# Patient Record
Sex: Female | Born: 1965 | Race: Black or African American | Hispanic: No | Smoking: Former smoker
Health system: Southern US, Community
[De-identification: ages and names within clinical notes are randomized; demographics above are authoritative.]

## PROBLEM LIST (undated history)

## (undated) ENCOUNTER — Ambulatory Visit: Disposition: A | Discharge status: Other Acute Inpatient Hospital | Payer: 59

## (undated) DIAGNOSIS — I10 Essential (primary) hypertension: Secondary | ICD-10-CM

## (undated) DIAGNOSIS — E0590 Thyrotoxicosis, unspecified without thyrotoxic crisis or storm: Secondary | ICD-10-CM

## (undated) DIAGNOSIS — D219 Benign neoplasm of connective and other soft tissue, unspecified: Secondary | ICD-10-CM

## (undated) DIAGNOSIS — D649 Anemia, unspecified: Secondary | ICD-10-CM

## (undated) DIAGNOSIS — M1990 Unspecified osteoarthritis, unspecified site: Secondary | ICD-10-CM

## (undated) DIAGNOSIS — Z789 Other specified health status: Secondary | ICD-10-CM

## (undated) DIAGNOSIS — K8020 Calculus of gallbladder without cholecystitis without obstruction: Secondary | ICD-10-CM

## (undated) DIAGNOSIS — E669 Obesity, unspecified: Secondary | ICD-10-CM

## (undated) DIAGNOSIS — F1910 Other psychoactive substance abuse, uncomplicated: Secondary | ICD-10-CM

## (undated) DIAGNOSIS — E119 Type 2 diabetes mellitus without complications: Secondary | ICD-10-CM

## (undated) DIAGNOSIS — E079 Disorder of thyroid, unspecified: Secondary | ICD-10-CM

## (undated) HISTORY — DX: Calculus of gallbladder without cholecystitis without obstruction: K80.20

## (undated) HISTORY — DX: Benign neoplasm of connective and other soft tissue, unspecified: D21.9

## (undated) HISTORY — DX: Other psychoactive substance abuse, uncomplicated: F19.10

## (undated) HISTORY — DX: Anemia, unspecified: D64.9

## (undated) HISTORY — DX: Unspecified osteoarthritis, unspecified site: M19.90

## (undated) HISTORY — PX: OTHER SURGICAL HISTORY: SHX169

## (undated) HISTORY — DX: Type 2 diabetes mellitus without complications: E11.9

## (undated) HISTORY — PX: TUBAL LIGATION: SHX77

---

## 1994-03-04 HISTORY — PX: OTHER SURGICAL HISTORY: SHX169

## 1998-01-24 ENCOUNTER — Other Ambulatory Visit: Admission: RE | Admit: 1998-01-24 | Discharge: 1998-01-24 | Payer: Self-pay | Admitting: Obstetrics

## 1998-03-08 ENCOUNTER — Ambulatory Visit (HOSPITAL_COMMUNITY): Admission: RE | Admit: 1998-03-08 | Discharge: 1998-03-08 | Payer: Self-pay | Admitting: Obstetrics

## 1998-03-14 ENCOUNTER — Inpatient Hospital Stay (HOSPITAL_COMMUNITY): Admission: AD | Admit: 1998-03-14 | Discharge: 1998-03-14 | Payer: Self-pay | Admitting: Obstetrics

## 1998-03-16 ENCOUNTER — Other Ambulatory Visit: Admission: RE | Admit: 1998-03-16 | Discharge: 1998-03-16 | Payer: Self-pay | Admitting: Obstetrics

## 2000-07-09 ENCOUNTER — Emergency Department (HOSPITAL_COMMUNITY): Admission: EM | Admit: 2000-07-09 | Discharge: 2000-07-09 | Payer: Self-pay | Admitting: *Deleted

## 2000-09-30 ENCOUNTER — Emergency Department (HOSPITAL_COMMUNITY): Admission: EM | Admit: 2000-09-30 | Discharge: 2000-09-30 | Payer: Self-pay | Admitting: Internal Medicine

## 2000-09-30 ENCOUNTER — Encounter: Payer: Self-pay | Admitting: Internal Medicine

## 2005-12-11 ENCOUNTER — Emergency Department (HOSPITAL_COMMUNITY): Admission: EM | Admit: 2005-12-11 | Discharge: 2005-12-11 | Payer: Self-pay | Admitting: Emergency Medicine

## 2006-06-06 ENCOUNTER — Emergency Department (HOSPITAL_COMMUNITY): Admission: EM | Admit: 2006-06-06 | Discharge: 2006-06-06 | Payer: Self-pay | Admitting: Ophthalmology

## 2006-12-10 ENCOUNTER — Encounter: Admission: RE | Admit: 2006-12-10 | Discharge: 2006-12-10 | Payer: Self-pay | Admitting: Emergency Medicine

## 2008-12-17 ENCOUNTER — Emergency Department (HOSPITAL_COMMUNITY): Admission: EM | Admit: 2008-12-17 | Discharge: 2008-12-17 | Payer: Self-pay | Admitting: Emergency Medicine

## 2009-07-02 ENCOUNTER — Emergency Department (HOSPITAL_COMMUNITY): Admission: EM | Admit: 2009-07-02 | Discharge: 2009-07-02 | Payer: Self-pay | Admitting: Emergency Medicine

## 2009-08-24 ENCOUNTER — Emergency Department (HOSPITAL_COMMUNITY): Admission: EM | Admit: 2009-08-24 | Discharge: 2009-08-24 | Payer: Self-pay | Admitting: Emergency Medicine

## 2010-01-13 ENCOUNTER — Emergency Department (HOSPITAL_COMMUNITY): Admission: EM | Admit: 2010-01-13 | Discharge: 2010-01-13 | Payer: Self-pay | Admitting: Emergency Medicine

## 2010-03-25 ENCOUNTER — Encounter: Payer: Self-pay | Admitting: Emergency Medicine

## 2010-04-16 ENCOUNTER — Emergency Department (HOSPITAL_COMMUNITY)
Admission: EM | Admit: 2010-04-16 | Discharge: 2010-04-16 | Disposition: A | Discharge status: Home / Self Care | Payer: Self-pay | Attending: Emergency Medicine | Admitting: Emergency Medicine

## 2010-04-16 DIAGNOSIS — N898 Other specified noninflammatory disorders of vagina: Secondary | ICD-10-CM | POA: Insufficient documentation

## 2010-04-16 LAB — CBC
HCT: 32.3 % — ABNORMAL LOW (ref 36.0–46.0)
Hemoglobin: 10.8 g/dL — ABNORMAL LOW (ref 12.0–15.0)
MCH: 27.8 pg (ref 26.0–34.0)
MCHC: 33.4 g/dL (ref 30.0–36.0)
MCV: 83.2 fL (ref 78.0–100.0)
Platelets: 269 10*3/uL (ref 150–400)
RBC: 3.88 MIL/uL (ref 3.87–5.11)
RDW: 13.3 % (ref 11.5–15.5)
WBC: 9.3 10*3/uL (ref 4.0–10.5)

## 2010-04-16 LAB — DIFFERENTIAL
Basophils Absolute: 0.1 10*3/uL (ref 0.0–0.1)
Basophils Relative: 1 % (ref 0–1)
Eosinophils Absolute: 0.1 10*3/uL (ref 0.0–0.7)
Eosinophils Relative: 1 % (ref 0–5)
Lymphocytes Relative: 42 % (ref 12–46)
Lymphs Abs: 3.9 10*3/uL (ref 0.7–4.0)
Monocytes Absolute: 0.5 10*3/uL (ref 0.1–1.0)
Monocytes Relative: 6 % (ref 3–12)
Neutro Abs: 4.8 10*3/uL (ref 1.7–7.7)
Neutrophils Relative %: 51 % (ref 43–77)

## 2010-05-19 ENCOUNTER — Emergency Department (HOSPITAL_COMMUNITY)
Admission: EM | Admit: 2010-05-19 | Discharge: 2010-05-19 | Disposition: A | Discharge status: Home / Self Care | Payer: Self-pay | Attending: Emergency Medicine | Admitting: Emergency Medicine

## 2010-05-19 DIAGNOSIS — D649 Anemia, unspecified: Secondary | ICD-10-CM | POA: Insufficient documentation

## 2010-05-19 DIAGNOSIS — A499 Bacterial infection, unspecified: Secondary | ICD-10-CM | POA: Insufficient documentation

## 2010-05-19 DIAGNOSIS — N898 Other specified noninflammatory disorders of vagina: Secondary | ICD-10-CM | POA: Insufficient documentation

## 2010-05-19 DIAGNOSIS — B9689 Other specified bacterial agents as the cause of diseases classified elsewhere: Secondary | ICD-10-CM | POA: Insufficient documentation

## 2010-05-19 DIAGNOSIS — N760 Acute vaginitis: Secondary | ICD-10-CM | POA: Insufficient documentation

## 2010-05-19 LAB — URINALYSIS, ROUTINE W REFLEX MICROSCOPIC
Glucose, UA: NEGATIVE mg/dL
Ketones, ur: NEGATIVE mg/dL
Leukocytes, UA: NEGATIVE
pH: 7.5 (ref 5.0–8.0)

## 2010-05-19 LAB — DIFFERENTIAL
Basophils Absolute: 0.1 10*3/uL (ref 0.0–0.1)
Lymphocytes Relative: 38 % (ref 12–46)
Monocytes Absolute: 0.5 10*3/uL (ref 0.1–1.0)
Neutro Abs: 4.6 10*3/uL (ref 1.7–7.7)

## 2010-05-19 LAB — CBC
HCT: 33.7 % — ABNORMAL LOW (ref 36.0–46.0)
Hemoglobin: 10.6 g/dL — ABNORMAL LOW (ref 12.0–15.0)
WBC: 8.4 10*3/uL (ref 4.0–10.5)

## 2010-05-19 LAB — WET PREP, GENITAL: Yeast Wet Prep HPF POC: NONE SEEN

## 2010-05-19 LAB — POCT PREGNANCY, URINE: Preg Test, Ur: NEGATIVE

## 2010-05-20 LAB — WOUND CULTURE

## 2010-05-23 LAB — GC/CHLAMYDIA PROBE AMP, GENITAL: GC Probe Amp, Genital: NEGATIVE

## 2010-06-07 LAB — GC/CHLAMYDIA PROBE AMP, GENITAL
Chlamydia, DNA Probe: NEGATIVE
GC Probe Amp, Genital: NEGATIVE

## 2010-06-07 LAB — WET PREP, GENITAL
Trich, Wet Prep: NONE SEEN
Yeast Wet Prep HPF POC: NONE SEEN

## 2010-11-05 ENCOUNTER — Emergency Department (HOSPITAL_COMMUNITY)
Admission: EM | Admit: 2010-11-05 | Discharge: 2010-11-05 | Disposition: A | Discharge status: Home / Self Care | Payer: No Typology Code available for payment source | Attending: Emergency Medicine | Admitting: Emergency Medicine

## 2010-11-05 DIAGNOSIS — S139XXA Sprain of joints and ligaments of unspecified parts of neck, initial encounter: Secondary | ICD-10-CM | POA: Insufficient documentation

## 2010-11-05 DIAGNOSIS — Y92410 Unspecified street and highway as the place of occurrence of the external cause: Secondary | ICD-10-CM | POA: Insufficient documentation

## 2010-11-06 ENCOUNTER — Encounter: Payer: Self-pay | Admitting: Family Medicine

## 2010-11-06 ENCOUNTER — Ambulatory Visit (INDEPENDENT_AMBULATORY_CARE_PROVIDER_SITE_OTHER): Payer: No Typology Code available for payment source | Admitting: Family Medicine

## 2010-11-06 DIAGNOSIS — Z0000 Encounter for general adult medical examination without abnormal findings: Secondary | ICD-10-CM

## 2010-11-06 DIAGNOSIS — N920 Excessive and frequent menstruation with regular cycle: Secondary | ICD-10-CM | POA: Insufficient documentation
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
## 2010-11-06 NOTE — Assessment & Plan Note (Signed)
Patient with menorrhagia for the past several months. Not currently. Asymptomatic of symptoms of anemia currently as well. Will check CBC at next visit (which will be a full physical). Consider starting OCPs if she is interested to help with bleeding. Patient is concerned about fibroids since her sister recently had a fibroidectomy for DUB.

## 2010-11-06 NOTE — Patient Instructions (Signed)
After you get the [facility] card, schedule a follow-up with me to get your physical done. We will do a pap smear at that time and check your labs.  Call and schedule a mammogram.    High-Fiber Diet A high-fiber diet changes your normal diet to include more whole grains, legumes, fruits, and vegetables. Changes in the diet involve replacing refined carbohydrates with unrefined foods. The calorie level of the diet is essentially unchanged. The Dietary Reference Intake (recommended amount) for adult males is 38 grams per day. For adult females, it is 25 grams per day. Pregnant and lactating women should consume 28 grams of fiber per day. Fiber is the intact part of a plant that is not broken down during digestion. Functional fiber is fiber that has been isolated from the plant to provide a beneficial effect in the body. PURPOSE  Increase stool bulk.   Ease and regulate bowel movements.   Lower cholesterol.  INDICATIONS THAT YOU NEED MORE FIBER  Constipation and hemorrhoids.   Uncomplicated diverticulosis (intestine condition) and irritable bowel syndrome.   Weight management.   As a protective measure against hardening of the arteries (atherosclerosis), diabetes, and cancer.  NOTE OF CAUTION If you have a digestive or bowel problem, ask your caregiver for advice before adding high-fiber foods to your diet. Some of the following medical problems are such that a high-fiber diet should not be used without consulting your caregiver. DO NOT USE WITH:  Acute diverticulitis (intestine infection).   Partial small bowel obstructions.   Complicated diverticular disease involving bleeding, rupture (perforation), or abscess (boil, furuncle).   Presence of autonomic neuropathy (nerve damage) or gastric paresis (stomach cannot empty itself).  GUIDELINES FOR INCREASING FIBER IN THE DIET  Start adding fiber to the diet slowly. A gradual increase of about 5 more grams (2 slices of whole-wheat bread, 2  servings of most fruits or vegetables, or 1 bowl of high-fiber cereal) per day is best. Too rapid an increase in fiber may result in constipation, flatulence, and bloating.   Drink enough water and fluids to keep your urine clear or pale yellow. Water, juice, or caffeine-free drinks are recommended. Not drinking enough fluid may cause constipation.   Eat a variety of high-fiber foods rather than one type of fiber.   Try to increase your intake of fiber through using high-fiber foods rather than fiber pills or supplements that contain small amounts of fiber.   The goal is to change the types of food eaten. Do not supplement your present diet with high-fiber foods, but replace foods in your present diet.  INCLUDE A VARIETY OF FIBER SOURCES  Replace refined and processed grains with whole grains, canned fruits with fresh fruits, and incorporate other fiber sources. White rice, white breads, and most bakery goods contain little or no fiber.   Brown whole-grain rice, buckwheat oats, and many fruits and vegetables are all good sources of fiber. These include: broccoli, Brussels sprouts, cabbage, cauliflower, beets, sweet potatoes, white potatoes (skin on), carrots, tomatoes, eggplant, squash, berries, fresh fruits, and dried fruits.   Cereals appear to be the richest source of fiber. Cereal fiber is found in whole grains and bran. Bran is the fiber-rich outer coat of cereal grain, which is largely removed in refining. In whole-grain cereals, the bran remains. In breakfast cereals, the largest amount of fiber is found in those with "bran" in their names. The fiber content is sometimes indicated on the label.   You may need to include additional  fruits and vegetables each day.   In baking, for 1 cup white flour, you may use the following substitutions:   1 cup whole-wheat flour minus 2 tablespoons.   1/2 cup white flour plus 1/2 cup whole-wheat flour.  References: Dietary Reference Intakes:  Recommended Intakes for Individuals. [employer]. Institute of Medicine. Food and Nutrition Board. Document Released: 02/18/2005 Document Re-Released: 05/15/2009 [employer] [facility] Patient Information 2011 [place], [place].

## 2010-11-06 NOTE — Assessment & Plan Note (Addendum)
Initial visit with me. Requesting physical and asked to re-schedule this at subsequent visit. Consider checking CMET and FLP at next visit and performing Pap smear, which she is overdue for, with GC/Chlamydia and HIV/RPR per her request to be tested for STIs. Also asking about being checked for hepatitis C due to her ex-husband having this condition. Will check HgbA1c if Glc on CMET is elevated. Also given information to schedule mammogram, which she has not had for several years.

## 2010-11-11 ENCOUNTER — Emergency Department (HOSPITAL_COMMUNITY): Payer: No Typology Code available for payment source

## 2010-11-11 ENCOUNTER — Emergency Department (HOSPITAL_COMMUNITY)
Admission: EM | Admit: 2010-11-11 | Discharge: 2010-11-11 | Disposition: A | Discharge status: Home / Self Care | Payer: No Typology Code available for payment source | Attending: Emergency Medicine | Admitting: Emergency Medicine

## 2010-11-11 DIAGNOSIS — M542 Cervicalgia: Secondary | ICD-10-CM | POA: Insufficient documentation

## 2010-11-21 ENCOUNTER — Other Ambulatory Visit: Payer: Self-pay | Admitting: Family Medicine

## 2010-11-21 ENCOUNTER — Emergency Department (HOSPITAL_COMMUNITY): Payer: No Typology Code available for payment source

## 2010-11-21 ENCOUNTER — Emergency Department (HOSPITAL_COMMUNITY)
Admission: EM | Admit: 2010-11-21 | Discharge: 2010-11-21 | Disposition: A | Discharge status: Home / Self Care | Payer: No Typology Code available for payment source | Attending: Emergency Medicine | Admitting: Emergency Medicine

## 2010-11-21 DIAGNOSIS — Z1231 Encounter for screening mammogram for malignant neoplasm of breast: Secondary | ICD-10-CM

## 2010-11-21 DIAGNOSIS — S139XXA Sprain of joints and ligaments of unspecified parts of neck, initial encounter: Secondary | ICD-10-CM | POA: Insufficient documentation

## 2010-11-21 DIAGNOSIS — Y92410 Unspecified street and highway as the place of occurrence of the external cause: Secondary | ICD-10-CM | POA: Insufficient documentation

## 2010-11-21 DIAGNOSIS — M542 Cervicalgia: Secondary | ICD-10-CM | POA: Insufficient documentation

## 2010-11-21 DIAGNOSIS — R079 Chest pain, unspecified: Secondary | ICD-10-CM | POA: Insufficient documentation

## 2010-11-27 ENCOUNTER — Other Ambulatory Visit (HOSPITAL_COMMUNITY)
Admission: RE | Admit: 2010-11-27 | Discharge: 2010-11-27 | Disposition: A | Discharge status: Home / Self Care | Payer: No Typology Code available for payment source | Source: Ambulatory Visit | Attending: Family Medicine | Admitting: Family Medicine

## 2010-11-27 ENCOUNTER — Ambulatory Visit (INDEPENDENT_AMBULATORY_CARE_PROVIDER_SITE_OTHER): Payer: No Typology Code available for payment source | Admitting: Family Medicine

## 2010-11-27 ENCOUNTER — Encounter: Payer: Self-pay | Admitting: Family Medicine

## 2010-11-27 DIAGNOSIS — S161XXA Strain of muscle, fascia and tendon at neck level, initial encounter: Secondary | ICD-10-CM | POA: Insufficient documentation

## 2010-11-27 DIAGNOSIS — Z0000 Encounter for general adult medical examination without abnormal findings: Secondary | ICD-10-CM

## 2010-11-27 DIAGNOSIS — N760 Acute vaginitis: Secondary | ICD-10-CM

## 2010-11-27 DIAGNOSIS — N920 Excessive and frequent menstruation with regular cycle: Secondary | ICD-10-CM

## 2010-11-27 DIAGNOSIS — Z124 Encounter for screening for malignant neoplasm of cervix: Secondary | ICD-10-CM

## 2010-11-27 DIAGNOSIS — R600 Localized edema: Secondary | ICD-10-CM | POA: Insufficient documentation

## 2010-11-27 DIAGNOSIS — R609 Edema, unspecified: Secondary | ICD-10-CM

## 2010-11-27 DIAGNOSIS — Z01419 Encounter for gynecological examination (general) (routine) without abnormal findings: Secondary | ICD-10-CM | POA: Insufficient documentation

## 2010-11-27 DIAGNOSIS — S139XXA Sprain of joints and ligaments of unspecified parts of neck, initial encounter: Secondary | ICD-10-CM

## 2010-11-27 LAB — POCT WET PREP (WET MOUNT): Yeast Wet Prep HPF POC: NEGATIVE

## 2010-11-27 LAB — CBC
HCT: 34.1 % — ABNORMAL LOW (ref 36.0–46.0)
Hemoglobin: 10.5 g/dL — ABNORMAL LOW (ref 12.0–15.0)
WBC: 7.5 10*3/uL (ref 4.0–10.5)

## 2010-11-27 LAB — COMPREHENSIVE METABOLIC PANEL
CO2: 25 mEq/L (ref 19–32)
Calcium: 10 mg/dL (ref 8.4–10.5)
Creat: 0.91 mg/dL (ref 0.50–1.10)
Glucose, Bld: 89 mg/dL (ref 70–99)
Sodium: 138 mEq/L (ref 135–145)
Total Bilirubin: 0.5 mg/dL (ref 0.3–1.2)
Total Protein: 7 g/dL (ref 6.0–8.3)

## 2010-11-27 LAB — LDL CHOLESTEROL, DIRECT: Direct LDL: 105 mg/dL — ABNORMAL HIGH

## 2010-11-27 MED ORDER — MEDICAL COMPRESSION SOCKS MISC: 1.0000 [IU] | Freq: Every day | Status: DC

## 2010-11-27 MED ORDER — NORGESTIMATE-ETH ESTRADIOL 0.25-35 MG-MCG PO TABS: 1.0000 | ORAL_TABLET | Freq: Every day | ORAL | Status: DC

## 2010-11-27 NOTE — Assessment & Plan Note (Signed)
Seems to be due to venous insufficiency. Will check labs (CBC, CMET) to evaluate kidneys and liver. No history of cardiac problems in past medical, family, or complaints. Recommending low salt diet and compression stockings.

## 2010-11-27 NOTE — Assessment & Plan Note (Signed)
Persistent. Seen in ED twice this month. Stop naproxen which makes her sleepy and try ibuprofen. Will refer to PT.

## 2010-11-27 NOTE — Assessment & Plan Note (Addendum)
Checking pap today. Flu shot today. Mammogram around January 2013 (patient arranging this).  Update: Wet prep shows BV. Called and left message with patient that Rx for metronidazole sent to pharmacy.
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
## 2010-11-27 NOTE — Assessment & Plan Note (Addendum)
Unchanged. Trial of OCPs. Will get ultrasound vagina/pelvis to investigate. Patient concerned about fibroids.   Update: ultrasound shows several large fibroids. Patient would like them removed. Will refer to gynecologist.

## 2010-11-27 NOTE — Patient Instructions (Addendum)
We'll check labs today. If your lab results are normal, I will send you a letter with the results. If abnormal, someone at the clinic will get in touch with you.   Follow-up in 3 months regarding your bleeding. Try the birth control pills.  We will order an ultrasound to look for fibroids.  We will refer you to physical therapy for your neck.  Try the ibuprofen for pain.  Try the compression stockings for your leg swelling.  Eat a low salt diet.  2 Gram Low Sodium Diet A 2 gram sodium diet restricts the amount of sodium in the diet to no more than 2 grams (g) or 2000 milligrams (mg) daily. Limiting the amount of sodium is often used to help lower blood pressure. It is important if you have heart, liver, or kidney problems. Many foods contain sodium for flavor and sometimes as a preservative. When the amount of sodium in a diet needs to be low, it is important to know what to look for when choosing foods and drinks. The following includes some information and guidelines to help make it easier for you to adapt to a low sodium diet. QUICK TIPS  Do not add salt to food.   Avoid convenience items and fast food.   Choose unsalted snack foods.   Buy lower sodium products, often labeled as "lower sodium" or "no salt added."   Check food labels to learn how much sodium is in 1 serving.   When eating at a restaurant, ask that your food be prepared with less salt or none, if possible.  READING FOOD LABELS FOR SODIUM INFORMATION The nutrition facts label is a good place to find how much sodium is in foods. Look for products with no more than 500 to 600 mg of sodium per meal and no more than 150 mg per serving. Remember that 2 g = 2000 mg. The food label may also list foods as:  Sodium-free: Less than 5 mg in a serving.   Very low sodium: 35 mg or less in a serving.   Low-sodium: 140 mg or less in a serving.   Light in sodium: 50% less sodium in a serving. For example, if a food that  usually has 300 mg of sodium is changed to become light in sodium, it will have 150 mg of sodium.   Reduced sodium: 25% less sodium in a serving. For example, if a food that usually has 400 mg of sodium is changed to reduced sodium, it will have 300 mg of sodium.  CHOOSING FOODS AVOID  CHOOSE   Grains:  Salted crackers and snack items. Some cereals, including instant hot cereals. Bread stuffing and biscuit mixes. Seasoned rice or pasta mixes.  Grains:  Unsalted snack items. Low-sodium cereals, oats, puffed wheat and rice, shredded wheat. English muffins and bread. Pasta.   Meats:  Salted, canned, smoked, spiced, or pickled meats, including fish and poultry. Bacon, ham, sausage, cold cuts, hot dogs, anchovies.  Meats:  Low-sodium canned tuna and salmon. Fresh or frozen meat, poultry, and fish.   Dairy:  Processed cheese and spreads. Cottage cheese. Buttermilk and condensed milk. Regular cheese.  Dairy:  Milk. Low-sodium cottage cheese. Yogurt. Sour cream. Low-sodium cheese.   Fruits and Vegetables:  Regular canned vegetables. Regular canned tomato sauce and paste. Frozen vegetables in sauces. Olives. Rosita Fire. Relishes. Sauerkraut.  Fruits and Vegetables:  Low-sodium canned vegetables. Low-sodium tomato sauce and paste. Fresh and frozen vegetables. Fresh and frozen fruit.   Condiments:  Canned and packaged gravies. Worcestershire sauce. Tartar sauce. Barbecue sauce. Soy sauce. Steak sauce. Ketchup. Onion, garlic, and table salt. Meat flavorings and tenderizers.  Condiments:  Fresh and dried herbs and spices. Low-sodium varieties of mustard and ketchup. Lemon juice. Tabasco sauce. Horseradish.   SAMPLE 2 GRAM SODIUM MEAL PLAN Meal  Foods  Sodium (mg)   Breakfast  1 cup low-fat milk  2 slices whole-wheat toast 1 tablespoon heart-healthy margarine 1 hard-boiled egg 1 small orange  143  [phone]   Lunch  1 cup raw carrots   cup hummus 1 cup low-fat  milk  cup red grapes 1 whole-wheat pita bread  76  [phone]   Dinner  1 cup whole-wheat pasta  1 cup low-sodium tomato sauce 3 ounces lean ground beef 1 small side salad (1 cup raw spinach leaves,  cup cucumber,  cup yellow bell pepper) with 1 teaspoon olive oil and 1 teaspoon red wine vinegar  2  73 [phone] 25   Snack  1 container low-fat vanilla yogurt  3 graham cracker squares  107  127   Nutrient Analysis Calories: 2033 Protein: 77 g Carbohydrate: 282 g Fat: 72 g Sodium: 1971 mg Document Released: 02/18/2005 Document Re-Released: 08/08/2009 [facility] Patient Information 2011 [place], [place].

## 2010-11-28 MED ORDER — METRONIDAZOLE 250 MG PO TABS: 250.0000 mg | ORAL_TABLET | Freq: Two times a day (BID) | ORAL | Status: AC

## 2010-11-28 NOTE — Progress Notes (Signed)
Addended by: Madolyn Frieze, Marylene Land J on: 11/28/2010 01:54 PM   Modules accepted: Orders

## 2010-11-29 ENCOUNTER — Encounter: Payer: Self-pay | Admitting: Family Medicine

## 2010-12-07 ENCOUNTER — Ambulatory Visit (HOSPITAL_COMMUNITY)
Admission: RE | Admit: 2010-12-07 | Discharge: 2010-12-07 | Disposition: A | Discharge status: Home / Self Care | Payer: Self-pay | Source: Ambulatory Visit | Attending: Family Medicine | Admitting: Family Medicine

## 2010-12-07 DIAGNOSIS — N949 Unspecified condition associated with female genital organs and menstrual cycle: Secondary | ICD-10-CM | POA: Insufficient documentation

## 2010-12-07 DIAGNOSIS — N920 Excessive and frequent menstruation with regular cycle: Secondary | ICD-10-CM

## 2010-12-07 DIAGNOSIS — N938 Other specified abnormal uterine and vaginal bleeding: Secondary | ICD-10-CM | POA: Insufficient documentation

## 2010-12-07 DIAGNOSIS — D252 Subserosal leiomyoma of uterus: Secondary | ICD-10-CM | POA: Insufficient documentation

## 2010-12-07 DIAGNOSIS — N946 Dysmenorrhea, unspecified: Secondary | ICD-10-CM | POA: Insufficient documentation

## 2010-12-08 NOTE — Progress Notes (Signed)
Addended by: Madolyn Frieze, Marylene Land J on: 12/08/2010 01:00 PM   Modules accepted: Orders

## 2010-12-12 ENCOUNTER — Ambulatory Visit
Admission: RE | Admit: 2010-12-12 | Discharge: 2010-12-12 | Disposition: A | Discharge status: Home / Self Care | Payer: Self-pay | Source: Ambulatory Visit | Attending: Family Medicine | Admitting: Family Medicine

## 2010-12-12 DIAGNOSIS — Z1231 Encounter for screening mammogram for malignant neoplasm of breast: Secondary | ICD-10-CM

## 2010-12-13 ENCOUNTER — Ambulatory Visit: Payer: No Typology Code available for payment source

## 2010-12-17 ENCOUNTER — Ambulatory Visit: Payer: No Typology Code available for payment source | Attending: Family Medicine | Admitting: Physical Therapy

## 2010-12-17 DIAGNOSIS — IMO0001 Reserved for inherently not codable concepts without codable children: Secondary | ICD-10-CM | POA: Insufficient documentation

## 2010-12-17 DIAGNOSIS — M542 Cervicalgia: Secondary | ICD-10-CM | POA: Insufficient documentation

## 2010-12-17 DIAGNOSIS — M25519 Pain in unspecified shoulder: Secondary | ICD-10-CM | POA: Insufficient documentation

## 2010-12-20 ENCOUNTER — Ambulatory Visit: Payer: No Typology Code available for payment source

## 2010-12-25 ENCOUNTER — Ambulatory Visit: Payer: No Typology Code available for payment source | Admitting: Physical Therapy

## 2010-12-27 ENCOUNTER — Ambulatory Visit: Payer: No Typology Code available for payment source

## 2011-01-01 ENCOUNTER — Ambulatory Visit: Payer: No Typology Code available for payment source | Admitting: Physical Therapy

## 2011-01-04 ENCOUNTER — Encounter: Payer: Self-pay | Admitting: Physical Therapy

## 2011-01-08 ENCOUNTER — Ambulatory Visit (INDEPENDENT_AMBULATORY_CARE_PROVIDER_SITE_OTHER): Payer: Self-pay | Admitting: Family Medicine

## 2011-01-08 VITALS — BP 135/89 | HR 69 | Temp 98.2°F | Ht 69.5 in | Wt 229.3 lb

## 2011-01-08 DIAGNOSIS — N920 Excessive and frequent menstruation with regular cycle: Secondary | ICD-10-CM

## 2011-01-08 LAB — CBC
HCT: 32.7 % — ABNORMAL LOW (ref 36.0–46.0)
RDW: 17.1 % — ABNORMAL HIGH (ref 11.5–15.5)
WBC: 7.5 10*3/uL (ref 4.0–10.5)

## 2011-01-08 MED ORDER — FERROUS SULFATE 325 (65 FE) MG PO TABS: 325.0000 mg | ORAL_TABLET | Freq: Every day | ORAL | Status: DC

## 2011-01-08 MED ORDER — IBUPROFEN 800 MG PO TABS: 800.0000 mg | ORAL_TABLET | Freq: Three times a day (TID) | ORAL | Status: AC | PRN

## 2011-01-08 MED ORDER — MEDROXYPROGESTERONE ACETATE 10 MG PO TABS: 10.0000 mg | ORAL_TABLET | Freq: Every day | ORAL | Status: DC

## 2011-01-08 NOTE — Assessment & Plan Note (Addendum)
Heavy bleeding. Known to have large fibroids and prolonged periods, awaiting to see gynecologist for initial evaluation on 11/29.  Will try ibuprofen for the next couple of days. Will also give Rx for medroxy-progesterone if ibuprofen does not help bleeding. Checking Hgb today. Last time between 10-11. Advised starting iron.

## 2011-01-08 NOTE — Progress Notes (Signed)
  Subjective:    Patient ID: Kimberly Anderson, female    DOB: Apr 28, 1965, 45 y.o.   MRN: [id number]  HPI Patient has history of heavy ovulatory bleeding.  Usually lasts 2 weeks.  Regular monthly periods.  Current episode started yesterday.  Using pad/tampon every 30 minutes.  Has some lightheadedness and has headaches.   Scheduled to see gynecologist 11/29 regarding this. Recent transvaginal/abdominal ultrasound showed several large fibroids.   Review of Systems Per HPI.     Objective:   Physical Exam Gen: NAD Mouth: MMM Psych: anxious-appearing Abd: no tenderness, obess Skin: 2-3 sec capillary refill Ext: warm    Assessment & Plan:

## 2011-01-08 NOTE — Patient Instructions (Addendum)
Try the ibuprofen 800 mg every 8 hours for the next couple of days. If your bleeding is still heavy, you may start the progesterone tablets. Take this for 7 days.   If the bleeding does not improve in the next week, please let me know.  Take the iron tablets for the next 2 weeks while you are bleeding. Eat high fiber foods and drink plenty of water. If you still get constipated, take an OTC stool softener.   Follow-up with me as needed regarding this.  See me in about a year for your regular physical.

## 2011-01-21 ENCOUNTER — Encounter: Payer: No Typology Code available for payment source | Admitting: Obstetrics and Gynecology

## 2011-01-31 ENCOUNTER — Encounter: Payer: Self-pay | Admitting: Obstetrics & Gynecology

## 2011-01-31 ENCOUNTER — Other Ambulatory Visit (HOSPITAL_COMMUNITY)
Admission: RE | Admit: 2011-01-31 | Discharge: 2011-01-31 | Disposition: A | Discharge status: Home / Self Care | Payer: Self-pay | Source: Ambulatory Visit | Attending: Obstetrics & Gynecology | Admitting: Obstetrics & Gynecology

## 2011-01-31 ENCOUNTER — Ambulatory Visit (INDEPENDENT_AMBULATORY_CARE_PROVIDER_SITE_OTHER): Payer: Self-pay | Admitting: Obstetrics & Gynecology

## 2011-01-31 VITALS — BP 137/90 | HR 71 | Temp 98.5°F | Ht 69.0 in | Wt 228.3 lb

## 2011-01-31 DIAGNOSIS — Z23 Encounter for immunization: Secondary | ICD-10-CM

## 2011-01-31 DIAGNOSIS — D259 Leiomyoma of uterus, unspecified: Secondary | ICD-10-CM

## 2011-01-31 DIAGNOSIS — N938 Other specified abnormal uterine and vaginal bleeding: Secondary | ICD-10-CM

## 2011-01-31 DIAGNOSIS — D219 Benign neoplasm of connective and other soft tissue, unspecified: Secondary | ICD-10-CM

## 2011-01-31 DIAGNOSIS — N949 Unspecified condition associated with female genital organs and menstrual cycle: Secondary | ICD-10-CM

## 2011-01-31 DIAGNOSIS — N920 Excessive and frequent menstruation with regular cycle: Secondary | ICD-10-CM

## 2011-01-31 MED ORDER — INFLUENZA VIRUS VACC SPLIT PF IM SUSP
0.5000 mL | INTRAMUSCULAR | Status: AC
  Administered 2011-01-31: 0.5 mL via INTRAMUSCULAR

## 2011-01-31 MED ORDER — MEDROXYPROGESTERONE ACETATE 150 MG/ML IM SUSP
150.0000 mg | Freq: Once | INTRAMUSCULAR | Status: AC
Start: 2011-01-31 — End: 2011-01-31
  Administered 2011-01-31: 150 mg via INTRAMUSCULAR

## 2011-01-31 NOTE — Progress Notes (Signed)
  Subjective:    Patient ID: Kimberly Anderson, female    DOB: June 17, 1965, 45 y.o.   MRN: [id number]  HPI  Kimberly Anderson is a 45 yo M AA [id number] who was referred here from the [facility] clinic for treatment of menorrhagia due to fibroids.  Review of Systems Pap upt and normal  She has been experiencing dysparunia for at least 1 year. She is a Lawyer.  Objective:   Physical Exam   Enlarged uterus at U-1. There is a very large posterior fibroid as well as multiple others.  There are no appreciable adnexal masses.   After I discussed an endometrial biopsy and we signed the consent, I prepped her cervix with Iodine.  Her uterus sounded to 18 cm.  I obtained a moderate amount of tissue with the Pipelle. She tolerated the procedure well. Assessment & Plan:  Menorrhagia, anemia, and fibroids.  I will schedule her for a TAH/ possible BSO. She understands the risks of surgery. She will get her flu shot and a depo provera shot today.

## 2011-02-18 ENCOUNTER — Encounter (HOSPITAL_COMMUNITY): Payer: Self-pay

## 2011-03-06 ENCOUNTER — Encounter (HOSPITAL_COMMUNITY): Payer: Self-pay

## 2011-03-06 ENCOUNTER — Encounter (HOSPITAL_COMMUNITY)
Admission: RE | Admit: 2011-03-06 | Discharge: 2011-03-06 | Disposition: A | Discharge status: Home / Self Care | Payer: Self-pay | Source: Ambulatory Visit | Attending: Obstetrics & Gynecology | Admitting: Obstetrics & Gynecology

## 2011-03-06 HISTORY — DX: Other specified health status: Z78.9

## 2011-03-06 LAB — CBC
HCT: 36.5 % (ref 36.0–46.0)
Hemoglobin: 11.8 g/dL — ABNORMAL LOW (ref 12.0–15.0)
MCH: 25.5 pg — ABNORMAL LOW (ref 26.0–34.0)
MCHC: 32.3 g/dL (ref 30.0–36.0)
MCV: 78.8 fL (ref 78.0–100.0)
RDW: 16.3 % — ABNORMAL HIGH (ref 11.5–15.5)

## 2011-03-06 NOTE — Patient Instructions (Addendum)
20 Kimberly Anderson  03/06/2011   Your procedure is scheduled on:  03/11/11  10:00  Enter through the Main Entrance of [facility] at 8:30 AM.  Pick up the phone at the desk and dial [phone].   Call this number if you have problems the morning of surgery: [phone]   Remember:   Do not eat food:After Midnight.  Do not drink clear liquids: After Midnight.  Take these medicines the morning of surgery with A SIP OF WATER: NA   Do not wear jewelry, make-up or nail polish.  Do not wear lotions, powders, or perfumes. You may wear deodorant.  Do not shave 48 hours prior to surgery.  Do not bring valuables to the hospital.  Contacts, dentures or bridgework may not be worn into surgery.  Leave suitcase in the car. After surgery it may be brought to your room.  For patients admitted to the hospital, checkout time is 11:00 AM the day of discharge.   Patients discharged the day of surgery will not be allowed to drive home.  Name and phone number of your driver: NA  Special Instructions: CHG Shower Use Special Wash: 1/2 bottle night before surgery and 1/2 bottle morning of surgery.   Please read over the following fact sheets that you were given: MRSA Information

## 2011-03-11 ENCOUNTER — Encounter (HOSPITAL_COMMUNITY): Payer: Self-pay | Admitting: Anesthesiology

## 2011-03-11 ENCOUNTER — Inpatient Hospital Stay (HOSPITAL_COMMUNITY): Payer: Self-pay | Admitting: Anesthesiology

## 2011-03-11 ENCOUNTER — Encounter (HOSPITAL_COMMUNITY): Payer: Self-pay | Admitting: Obstetrics & Gynecology

## 2011-03-11 ENCOUNTER — Encounter (HOSPITAL_COMMUNITY)
Admission: RE | Disposition: A | Discharge status: Home / Self Care | Payer: Self-pay | Source: Ambulatory Visit | Attending: Obstetrics & Gynecology

## 2011-03-11 ENCOUNTER — Encounter (HOSPITAL_COMMUNITY): Payer: Self-pay | Admitting: *Deleted

## 2011-03-11 ENCOUNTER — Inpatient Hospital Stay (HOSPITAL_COMMUNITY)
Admission: RE | Admit: 2011-03-11 | Discharge: 2011-03-14 | DRG: 743 | Disposition: A | Discharge status: Home / Self Care | Payer: Self-pay | Source: Ambulatory Visit | Attending: Obstetrics & Gynecology | Admitting: Obstetrics & Gynecology

## 2011-03-11 ENCOUNTER — Other Ambulatory Visit: Payer: Self-pay | Admitting: Obstetrics & Gynecology

## 2011-03-11 DIAGNOSIS — D251 Intramural leiomyoma of uterus: Secondary | ICD-10-CM

## 2011-03-11 DIAGNOSIS — D252 Subserosal leiomyoma of uterus: Secondary | ICD-10-CM

## 2011-03-11 DIAGNOSIS — N938 Other specified abnormal uterine and vaginal bleeding: Principal | ICD-10-CM | POA: Diagnosis present

## 2011-03-11 DIAGNOSIS — D259 Leiomyoma of uterus, unspecified: Secondary | ICD-10-CM

## 2011-03-11 DIAGNOSIS — D649 Anemia, unspecified: Secondary | ICD-10-CM | POA: Diagnosis present

## 2011-03-11 DIAGNOSIS — D250 Submucous leiomyoma of uterus: Secondary | ICD-10-CM

## 2011-03-11 DIAGNOSIS — N925 Other specified irregular menstruation: Secondary | ICD-10-CM

## 2011-03-11 DIAGNOSIS — N80209 Endometriosis of unspecified fallopian tube, unspecified depth: Secondary | ICD-10-CM | POA: Diagnosis present

## 2011-03-11 DIAGNOSIS — E669 Obesity, unspecified: Secondary | ICD-10-CM | POA: Diagnosis present

## 2011-03-11 DIAGNOSIS — N802 Endometriosis of fallopian tube: Secondary | ICD-10-CM | POA: Diagnosis present

## 2011-03-11 DIAGNOSIS — N949 Unspecified condition associated with female genital organs and menstrual cycle: Principal | ICD-10-CM | POA: Diagnosis present

## 2011-03-11 HISTORY — DX: Obesity, unspecified: E66.9

## 2011-03-11 HISTORY — PX: SALPINGOOPHORECTOMY: SHX82

## 2011-03-11 HISTORY — PX: ABDOMINAL HYSTERECTOMY: SHX81

## 2011-03-11 SURGERY — HYSTERECTOMY, ABDOMINAL
Anesthesia: Spinal | Site: Abdomen

## 2011-03-11 MED ORDER — MEPERIDINE HCL 25 MG/ML IJ SOLN: 6.2500 mg | INTRAMUSCULAR | Status: DC | PRN

## 2011-03-11 MED ORDER — ZOLPIDEM TARTRATE 5 MG PO TABS
5.0000 mg | ORAL_TABLET | Freq: Every evening | ORAL | Status: DC | PRN
  Administered 2011-03-12: 5 mg via ORAL
  Filled 2011-03-11: qty 1

## 2011-03-11 MED ORDER — METOCLOPRAMIDE HCL 5 MG/ML IJ SOLN: 10.0000 mg | Freq: Once | INTRAMUSCULAR | Status: DC | PRN

## 2011-03-11 MED ORDER — DEXTROSE IN LACTATED RINGERS 5 % IV SOLN: INTRAVENOUS | Status: DC

## 2011-03-11 MED ORDER — SIMETHICONE 80 MG PO CHEW
80.0000 mg | CHEWABLE_TABLET | Freq: Four times a day (QID) | ORAL | Status: DC | PRN
  Administered 2011-03-13: 80 mg via ORAL

## 2011-03-11 MED ORDER — CEFAZOLIN SODIUM 1-5 GM-% IV SOLN
1.0000 g | INTRAVENOUS | Status: AC
  Administered 2011-03-11: 1 g via INTRAVENOUS

## 2011-03-11 MED ORDER — PROPOFOL 10 MG/ML IV EMUL
INTRAVENOUS | Status: DC | PRN
  Administered 2011-03-11: 25 ug/kg/min via INTRAVENOUS

## 2011-03-11 MED ORDER — PROMETHAZINE HCL 25 MG/ML IJ SOLN: 12.5000 mg | INTRAMUSCULAR | Status: DC | PRN

## 2011-03-11 MED ORDER — ONDANSETRON HCL 4 MG/2ML IJ SOLN
4.0000 mg | Freq: Three times a day (TID) | INTRAMUSCULAR | Status: DC | PRN
  Filled 2011-03-11: qty 2

## 2011-03-11 MED ORDER — SODIUM CHLORIDE 0.9 % IJ SOLN: 3.0000 mL | INTRAMUSCULAR | Status: DC | PRN

## 2011-03-11 MED ORDER — MIDAZOLAM HCL 5 MG/5ML IJ SOLN
INTRAMUSCULAR | Status: DC | PRN
  Administered 2011-03-11: 2 mg via INTRAVENOUS

## 2011-03-11 MED ORDER — DIPHENHYDRAMINE HCL 50 MG/ML IJ SOLN
12.5000 mg | INTRAMUSCULAR | Status: DC | PRN
  Administered 2011-03-11 – 2011-03-12 (×3): 12.5 mg via INTRAVENOUS
  Filled 2011-03-11 (×2): qty 1

## 2011-03-11 MED ORDER — OXYCODONE-ACETAMINOPHEN 5-325 MG PO TABS
1.0000 | ORAL_TABLET | ORAL | Status: DC | PRN
  Administered 2011-03-12 – 2011-03-14 (×10): 2 via ORAL
  Filled 2011-03-11 (×10): qty 2

## 2011-03-11 MED ORDER — CEFAZOLIN SODIUM 1-5 GM-% IV SOLN
INTRAVENOUS | Status: AC
  Filled 2011-03-11: qty 50

## 2011-03-11 MED ORDER — NALOXONE HCL 0.4 MG/ML IJ SOLN: 0.4000 mg | INTRAMUSCULAR | Status: DC | PRN

## 2011-03-11 MED ORDER — IBUPROFEN 800 MG PO TABS
800.0000 mg | ORAL_TABLET | Freq: Three times a day (TID) | ORAL | Status: DC | PRN
  Administered 2011-03-12 – 2011-03-14 (×2): 800 mg via ORAL
  Filled 2011-03-11 (×2): qty 1

## 2011-03-11 MED ORDER — IBUPROFEN 600 MG PO TABS: 600.0000 mg | ORAL_TABLET | Freq: Four times a day (QID) | ORAL | Status: DC | PRN

## 2011-03-11 MED ORDER — MORPHINE SULFATE (PF) 0.5 MG/ML IJ SOLN
INTRAMUSCULAR | Status: DC | PRN
  Administered 2011-03-11: .15 mg via INTRATHECAL

## 2011-03-11 MED ORDER — LACTATED RINGERS IV SOLN
INTRAVENOUS | Status: DC
Start: 2011-03-11 — End: 2011-03-11
  Administered 2011-03-11 (×2): via INTRAVENOUS

## 2011-03-11 MED ORDER — KETOROLAC TROMETHAMINE 30 MG/ML IJ SOLN: 30.0000 mg | Freq: Four times a day (QID) | INTRAMUSCULAR | Status: AC | PRN

## 2011-03-11 MED ORDER — ONDANSETRON HCL 4 MG/2ML IJ SOLN
INTRAMUSCULAR | Status: DC | PRN
  Administered 2011-03-11: 4 mg via INTRAVENOUS

## 2011-03-11 MED ORDER — BUPIVACAINE HCL (PF) 0.5 % IJ SOLN
INTRAMUSCULAR | Status: DC | PRN
  Administered 2011-03-11: 30 mL

## 2011-03-11 MED ORDER — DIPHENHYDRAMINE HCL 25 MG PO CAPS
25.0000 mg | ORAL_CAPSULE | ORAL | Status: DC | PRN
  Administered 2011-03-11: 25 mg via ORAL
  Filled 2011-03-11 (×3): qty 1

## 2011-03-11 MED ORDER — SODIUM CHLORIDE 0.9 % IV SOLN: 1.0000 ug/kg/h | INTRAVENOUS | Status: DC | PRN

## 2011-03-11 MED ORDER — LACTATED RINGERS IV SOLN
INTRAVENOUS | Status: DC
  Administered 2011-03-11 – 2011-03-12 (×2): via INTRAVENOUS

## 2011-03-11 MED ORDER — DIPHENHYDRAMINE HCL 50 MG/ML IJ SOLN
INTRAMUSCULAR | Status: AC
  Filled 2011-03-11: qty 1

## 2011-03-11 MED ORDER — DOCUSATE SODIUM 100 MG PO CAPS
100.0000 mg | ORAL_CAPSULE | Freq: Two times a day (BID) | ORAL | Status: DC
  Administered 2011-03-11 – 2011-03-14 (×7): 100 mg via ORAL
  Filled 2011-03-11 (×12): qty 1

## 2011-03-11 MED ORDER — BUPIVACAINE IN DEXTROSE 0.75-8.25 % IT SOLN
INTRATHECAL | Status: DC | PRN
  Administered 2011-03-11: 15 mg via INTRATHECAL

## 2011-03-11 MED ORDER — KETOROLAC TROMETHAMINE 30 MG/ML IJ SOLN
30.0000 mg | Freq: Four times a day (QID) | INTRAMUSCULAR | Status: AC | PRN
  Administered 2011-03-11 (×2): 30 mg via INTRAVENOUS
  Filled 2011-03-11 (×2): qty 1

## 2011-03-11 MED ORDER — NALBUPHINE HCL 10 MG/ML IJ SOLN
5.0000 mg | INTRAMUSCULAR | Status: DC | PRN
  Filled 2011-03-11: qty 1

## 2011-03-11 MED ORDER — FENTANYL CITRATE 0.05 MG/ML IJ SOLN
INTRAMUSCULAR | Status: DC | PRN
  Administered 2011-03-11: 100 ug via INTRAVENOUS
  Administered 2011-03-11: 50 ug via INTRAVENOUS

## 2011-03-11 MED ORDER — FENTANYL CITRATE 0.05 MG/ML IJ SOLN
INTRAMUSCULAR | Status: DC | PRN
  Administered 2011-03-11: 25 ug via INTRATHECAL

## 2011-03-11 MED ORDER — DIPHENHYDRAMINE HCL 50 MG/ML IJ SOLN: 25.0000 mg | INTRAMUSCULAR | Status: DC | PRN

## 2011-03-11 MED ORDER — NALBUPHINE HCL 10 MG/ML IJ SOLN
5.0000 mg | INTRAMUSCULAR | Status: DC | PRN
  Administered 2011-03-12: 10 mg via SUBCUTANEOUS
  Filled 2011-03-11: qty 1

## 2011-03-11 MED ORDER — FENTANYL CITRATE 0.05 MG/ML IJ SOLN: 25.0000 ug | INTRAMUSCULAR | Status: DC | PRN

## 2011-03-11 MED ORDER — LACTATED RINGERS IV SOLN
INTRAVENOUS | Status: DC
  Administered 2011-03-11: 125 mL via INTRAVENOUS

## 2011-03-11 SURGICAL SUPPLY — 24 items
CANISTER SUCTION 2500CC (MISCELLANEOUS) ×3 IMPLANT
CHLORAPREP W/TINT 26ML (MISCELLANEOUS) ×3 IMPLANT
CLOTH BEACON ORANGE TIMEOUT ST (SAFETY) ×3 IMPLANT
CONT PATH 16OZ SNAP LID 3702 (MISCELLANEOUS) ×3 IMPLANT
DECANTER SPIKE VIAL GLASS SM (MISCELLANEOUS) IMPLANT
GAUZE SPONGE 4X4 16PLY XRAY LF (GAUZE/BANDAGES/DRESSINGS) ×3 IMPLANT
GLOVE BIO SURGEON STRL SZ 6.5 (GLOVE) ×6 IMPLANT
GOWN PREVENTION PLUS LG XLONG (DISPOSABLE) ×9 IMPLANT
NEEDLE SPNL 18GX3.5 QUINCKE PK (NEEDLE) ×3 IMPLANT
NS IRRIG 1000ML POUR BTL (IV SOLUTION) ×3 IMPLANT
PACK ABDOMINAL GYN (CUSTOM PROCEDURE TRAY) ×3 IMPLANT
PAD OB MATERNITY 4.3X12.25 (PERSONAL CARE ITEMS) ×3 IMPLANT
PENCIL BUTTON HOLSTER BLD 10FT (ELECTRODE) ×3 IMPLANT
SPONGE LAP 18X18 X RAY DECT (DISPOSABLE) ×6 IMPLANT
STRIP CLOSURE SKIN 1/2X4 (GAUZE/BANDAGES/DRESSINGS) ×3 IMPLANT
SUT CHROMIC 3 0 SH 27 (SUTURE) IMPLANT
SUT VIC AB 0 CT1 36 (SUTURE) ×9 IMPLANT
SUT VIC AB 2-0 CT1 18 (SUTURE) ×9 IMPLANT
SUT VIC AB 3-0 CT1 27 (SUTURE) ×1
SUT VIC AB 3-0 CT1 TAPERPNT 27 (SUTURE) ×2 IMPLANT
SYR 30ML LL (SYRINGE) IMPLANT
TOWEL OR 17X24 6PK STRL BLUE (TOWEL DISPOSABLE) ×6 IMPLANT
TRAY FOLEY CATH 14FR (SET/KITS/TRAYS/PACK) ×3 IMPLANT
WATER STERILE IRR 1000ML POUR (IV SOLUTION) ×3 IMPLANT
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
## 2011-03-11 NOTE — Anesthesia Preprocedure Evaluation (Signed)
Anesthesia Evaluation  Patient identified by MRN, date of birth, ID band Patient awake    Reviewed: Allergy & Precautions, H&P , NPO status , Patient's Chart, lab work & pertinent test results  Airway Mallampati: II TM Distance: >3 FB Neck ROM: full    Dental No notable dental hx. (+) Teeth Intact   Pulmonary neg pulmonary ROS,  clear to auscultation  Pulmonary exam normal       Cardiovascular neg cardio ROS regular Normal    Neuro/Psych Negative Neurological ROS  Negative Psych ROS   GI/Hepatic negative GI ROS, Neg liver ROS,   Endo/Other  Negative Endocrine ROS  Renal/GU negative Renal ROS  Genitourinary negative   Musculoskeletal   Abdominal Normal abdominal exam  (+)   Peds  Hematology negative hematology ROS (+)   Anesthesia Other Findings   Reproductive/Obstetrics negative OB ROS                           Anesthesia Physical Anesthesia Plan  ASA: I  Anesthesia Plan: Spinal   Post-op Pain Management:    Induction:   Airway Management Planned:   Additional Equipment:   Intra-op Plan:   Post-operative Plan:   Informed Consent: I have reviewed the patients History and Physical, chart, labs and discussed the procedure including the risks, benefits and alternatives for the proposed anesthesia with the patient or authorized representative who has indicated his/her understanding and acceptance.   Dental Advisory Given  Plan Discussed with: Anesthesiologist, CRNA and Surgeon  Anesthesia Plan Comments:         Anesthesia Quick Evaluation

## 2011-03-11 NOTE — Transfer of Care (Signed)
Immediate Anesthesia Transfer of Care Note  Patient: Kimberly Anderson  Procedure(s) Performed:  HYSTERECTOMY ABDOMINAL - Possible removal of tubes & ovaries  Patient Location: PACU  Anesthesia Type: Spinal  Level of Consciousness: awake, alert , oriented and patient cooperative  Airway & Oxygen Therapy: Patient Spontanous Breathing  Post-op Assessment: Report given to PACU RN  Post vital signs: stable  Complications: No apparent anesthesia complications

## 2011-03-11 NOTE — Anesthesia Postprocedure Evaluation (Signed)
  Anesthesia Post-op Note  Patient: Kimberly Anderson  Procedure(s) Performed:  HYSTERECTOMY ABDOMINAL - Possible removal of tubes & ovaries; SALPINGO OOPHERECTOMY  Patient Location: PACU  Anesthesia Type: Spinal  Level of Consciousness: awake, alert  and oriented  Airway and Oxygen Therapy: Patient Spontanous Breathing  Post-op Pain: none  Post-op Assessment: Post-op Vital signs reviewed, Patient's Cardiovascular Status Stable, Respiratory Function Stable, Patent Airway, No signs of Nausea or vomiting, Pain level controlled, No headache and No backache  Post-op Vital Signs: Reviewed and stable  Complications: No apparent anesthesia complications

## 2011-03-11 NOTE — Anesthesia Procedure Notes (Signed)
Spinal  Patient location during procedure: OR Start time: 03/11/2011 10:12 AM Staffing Anesthesiologist: Arriyana Rodell A. Performed by: anesthesiologist  Preanesthetic Checklist Completed: patient identified, site marked, surgical consent, pre-op evaluation, timeout performed, IV checked, risks and benefits discussed and monitors and equipment checked Spinal Block Patient position: sitting Prep: site prepped and draped and DuraPrep Patient monitoring: heart rate, cardiac monitor, continuous pulse ox and blood pressure Approach: midline Location: L3-4 Injection technique: single-shot Needle Needle type: Sprotte  Needle gauge: 24 G Needle length: 9 cm Needle insertion depth: 5 cm Assessment Sensory level: T4 Additional Notes Patient tolerated procedure well. Adequate sensory level.

## 2011-03-11 NOTE — Op Note (Signed)
03/11/2011  11:08 AM  PATIENT:  Kimberly Anderson  46 y.o. female  PRE-OPERATIVE DIAGNOSIS:  Fibroids [218.9], obesity, anemia  POST-OPERATIVE DIAGNOSIS:  same PROCEDURE:  Procedure(s): HYSTERECTOMY ABDOMINAL BILATERAL SALPINGOOPHERECTOMY  SURGEON:  Nicholaus Bloom, MD PHYSICIAN ASSISTANT:   ASSISTANTS: Catalina Antigua, MD   ANESTHESIA:   spinal  EBL:  Total I/O In: 2000 [I.V.:2000] Out: 500 [Urine:200; Blood:300]  BLOOD ADMINISTERED:none  DRAINS: none   LOCAL MEDICATIONS USED:  MARCAINE 30CC  SPECIMEN:  Source of Specimen:  uterus, tubes, ovaries  DISPOSITION OF SPECIMEN:  PATHOLOGY  COUNTS:  YES  TOURNIQUET:  * No tourniquets in log *  DICTATION: .Dragon Dictation  PLAN OF CARE: Admit to inpatient   PATIENT DISPOSITION:  PACU - hemodynamically stable.  The risks, benefits, and alternatives of surgery were explained, understood and accepted. Consents were signed and all questions were answered. After a long discussion she decided to have her ovaries removed, understanding the potential desire for future estrogen replacement therapy. In the operating room her spinal anesthesia was applied without complication. She was placed in the dorsal supine position. Her abdomen and vagina were prepped and draped in the usual sterile fashion. A Foley catheter was placed which drained clear urine throughout the case. After adequate anesthesia was assured a transverse incision was made approximately 2 cm above her symphysis pubis. Incision was carried down through the subcutaneous tissue to the fascia. Bleeding encountered was cauterized with the Bovie. The fascia was scored in the midline. The fascial incision was extended bilaterally. The pyramidalis muscles and the middle 20% of the rectus muscles were separated in a transverse fashion using electrosurgical technique. The peritoneum was entered with hemostats. The peritoneal incision was extended bilaterally and curved slightly upwards  with the Bovie. The very large uterus was grasped with a towel clip and elevated out of the uterine incision. This gave [place] excellent visualization of her infundibulopelvic ligaments and ovaries. Was sponges were placed behind the uterus and the posterior cul-de-sac. The infundibulopelvic ligaments were clamped, cut, and doubly ligated on each side. 2 Vicryl sutures used throughout this case was otherwise specified. The round ligaments were clamped, cut, and ligated.A bladder flap was created anteriorly. The uterine vessels were skeletonized bilaterally. They were then doubly ligated.the bladder was pushed downwards with a moist sponge on a stick. The cervix was separated from its pelvic attachments with the same clamp, cut, ligate technique. Excellent hemostasis was maintained. The cervix was removed and noted to be intact. The vaginal cuff was closed with a 0 Vicryl suture. Excellent hemostasis was noted from all pedicles. The oviducts were visualized and noted to be of normal caliber and functioning normally. The pelvis was irrigated with a liter of warm normal saline. The sponges were removed and her fascia was closed with a 0 Vicryl running non-locking suture. No defects were palpable. The subcutaneous tissue was infiltrated with a total of 30 mL of 0.5% Marcaine. The subcutaneous tissue was irrigated with saline. A subcuticular closure was done with 2-0 Vicryl suture in a running nonlocking fashion. Steri-Strips were placed across her incision. She was taken to the recovery room in stable condition.

## 2011-03-12 ENCOUNTER — Encounter (HOSPITAL_COMMUNITY): Payer: Self-pay | Admitting: Obstetrics & Gynecology

## 2011-03-12 LAB — CBC
HCT: 28.4 % — ABNORMAL LOW (ref 36.0–46.0)
MCHC: 32 g/dL (ref 30.0–36.0)
MCV: 78.5 fL (ref 78.0–100.0)
RDW: 16.5 % — ABNORMAL HIGH (ref 11.5–15.5)

## 2011-03-12 NOTE — Progress Notes (Signed)
UR Chart review completed.  

## 2011-03-12 NOTE — Addendum Note (Signed)
Addendum  created 03/12/11 1054 by Suella Grove   Modules edited:Notes Section

## 2011-03-12 NOTE — Anesthesia Postprocedure Evaluation (Signed)
  Anesthesia Post-op Note  Patient: Kimberly Anderson  Procedure(s) Performed:  HYSTERECTOMY ABDOMINAL - Possible removal of tubes & ovaries; SALPINGO OOPHERECTOMY  Patient Location: [facility]  Anesthesia Type: Epidural  Level of Consciousness: awake  Airway and Oxygen Therapy: Patient Spontanous Breathing  Post-op Pain: none  Post-op Assessment: Patient's Cardiovascular Status Stable, Respiratory Function Stable and Pain level controlled  Post-op Vital Signs: Reviewed and stable  Complications: No apparent anesthesia complications

## 2011-03-12 NOTE — Progress Notes (Signed)
1 Day Post-Op Procedure(s): HYSTERECTOMY ABDOMINAL SALPINGO OOPHERECTOMY  Subjective: Patient reports tolerating PO and + flatus.    Objective: I have reviewed patient's vital signs, intake and output, medications and labs.  General: alert Resp: clear to auscultation bilaterally Cardio: regular rate and rhythm, S1, S2 normal, no murmur, click, rub or gallop GI: soft, non-tender; bowel sounds normal; no masses,  no organomegaly  Assessment: s/p Procedure(s): HYSTERECTOMY ABDOMINAL SALPINGO OOPHERECTOMY: stable and progressing well  Plan: Encourage ambulation Advance to PO medication  LOS: 1 day    Kimberly Anderson C. 03/12/2011, 9:50 AM

## 2011-03-13 MED ORDER — PROMETHAZINE HCL 25 MG PO TABS
25.0000 mg | ORAL_TABLET | Freq: Four times a day (QID) | ORAL | Status: DC | PRN
  Administered 2011-03-13: 25 mg via ORAL
  Filled 2011-03-13: qty 1

## 2011-03-13 MED ORDER — POLYETHYLENE GLYCOL 3350 17 G PO PACK
17.0000 g | PACK | Freq: Every day | ORAL | Status: DC
  Administered 2011-03-13: 17 g via ORAL
  Filled 2011-03-13 (×2): qty 1

## 2011-03-13 NOTE — Progress Notes (Signed)
2 Days Post-Op Procedure(s): HYSTERECTOMY ABDOMINAL SALPINGO OOPHERECTOMY  Subjective: She is dressed and tells me she is ready to go home. But, she just threw up. Until just now, she has been eating a regular diet without any nausea or vomitting. She has flatus but no BM. She is ambulating and voiding without difficulty.  Objective: I have reviewed patient's vital signs, intake and output, medications and labs.  General: alert Resp: clear to auscultation bilaterally Cardio: regular rate and rhythm, S1, S2 normal, no murmur, click, rub or gallop GI: soft, non-tender; bowel sounds normal; no masses,  no organomegaly Incision: healing great Assessment: s/p Procedure(s): HYSTERECTOMY ABDOMINAL SALPINGO OOPHERECTOMY: stable  Plan: Discharge home later this afternoon if her nausea/vomitting resolves.  LOS: 2 days    Kimberly Anderson C. 03/13/2011, 1:22 PM

## 2011-03-14 MED ORDER — IBUPROFEN 800 MG PO TABS: 800.0000 mg | ORAL_TABLET | Freq: Three times a day (TID) | ORAL | Status: AC | PRN

## 2011-03-14 MED ORDER — OXYCODONE-ACETAMINOPHEN 5-325 MG PO TABS
1.0000 | ORAL_TABLET | ORAL | Status: AC | PRN
Start: 2011-03-14 — End: 2011-03-24

## 2011-03-14 NOTE — Progress Notes (Signed)
Pt discharged to home with father ambulated out  Teaching complete

## 2011-03-14 NOTE — Discharge Summary (Signed)
Physician Discharge Summary  Patient ID: Kimberly Anderson MRN: [id number] DOB/AGE: 10-15-1965 46 y.o.  Admit date: 03/11/2011 Discharge date: 03/14/2011  Admission Diagnoses: anemia, fibroids, DUB, obesity  Discharge Diagnoses: same Active Problems:  * No active hospital problems. *    Discharged Condition: good  Hospital Course: She underwent an uncomplicated TAH/BSO under spinal anesthesia. She was ready for discharge home on POD #2 but she started with nausea and vomitting in the afternoon and her discharge was delayed until POD #3. She has remained afebrile throughout her stay and has had flatus and a normal exam since POD #1. Her post op hbg was stable as were her vitals.  Consults: none  Significant Diagnostic Studies:   Treatments: surgery: as above  Discharge Exam: Blood pressure 119/85, pulse 72, temperature 98.4 F (36.9 C), temperature source Oral, resp. rate 18, height 5\' 9"  (1.753 m), weight 102.967 kg (227 lb), last menstrual period 03/04/2011, SpO2 100.00%. General appearance: alert and cooperative Resp: clear to auscultation bilaterally Cardio: regular rate and rhythm, S1, S2 normal, no murmur, click, rub or gallop GI: soft, non-tender; bowel sounds normal; no masses,  no organomegaly Incision- healing well  Disposition: Home or Self Care   Medication List  As of 03/14/2011  1:47 PM   START taking these medications         oxyCODONE-acetaminophen 5-325 MG per tablet   Commonly known as: PERCOCET   Take 1-2 tablets by mouth every 3 (three) hours as needed (moderate to severe pain (when tolerating fluids)).         CHANGE how you take these medications         ibuprofen 800 MG tablet   Commonly known as: ADVIL,MOTRIN   Take 1 tablet (800 mg total) by mouth every 8 (eight) hours as needed for pain (mild pain).   What changed: - how often to take the med - reasons to take the med - doctor's instructions         CONTINUE taking these medications        ALKA-SELTZER PLS NIGHT CLD/FLU PO      ascorbic acid 250 MG tablet   Commonly known as: VITAMIN C      b complex vitamins tablet      Calcium-Magnesium-Zinc 334-134-5 MG Tabs      guaiFENesin 100 MG/5ML liquid   Commonly known as: ROBITUSSIN      Medical Compression Socks Misc   1 Units by Does not apply route daily.      multivitamin tablet         STOP taking these medications         ferrous sulfate 325 (65 FE) MG tablet          Where to get your medications    These are the prescriptions that you need to pick up. We sent them to a specific pharmacy, so you will need to go there to get them.   [id number] [place], [place] - [address]    [address] [place] [address]    Phone: [phone]        ibuprofen 800 MG tablet         You may get these medications from any pharmacy.         oxyCODONE-acetaminophen 5-325 MG per tablet           Follow-up Information    Follow up with Donivin Wirt C., MD. Schedule an appointment as soon as possible  for a visit in 6 weeks.   Contact information:   [address] [place] [address] [phone]          Signed: Allie Bossier 03/14/2011, 1:47 PM

## 2011-03-25 ENCOUNTER — Telehealth: Payer: Self-pay

## 2011-03-25 NOTE — Telephone Encounter (Signed)
Pt called and stated that she had surgery from fibroids " thinks that I have an infection cause I have an odor".  Called pt and left message to return our call to the clinics.

## 2011-03-25 NOTE — Telephone Encounter (Signed)
Pt called and stated that she is having a foul, brownish colored vaginal discharge.   I informed pt that this discharge seems to be normal due to the fact that her body is expelling the old blood from her surgery.  Pt stated that she was not having any fever, chills.  I informed pt that I would need to get with our director so we can get her appt for Wed.  And i will call her and leave the appt time on her voicemail.  Pt stated understanding and had no further questions.

## 2011-03-27 ENCOUNTER — Ambulatory Visit (INDEPENDENT_AMBULATORY_CARE_PROVIDER_SITE_OTHER): Payer: No Typology Code available for payment source | Admitting: Obstetrics & Gynecology

## 2011-03-27 ENCOUNTER — Encounter: Payer: Self-pay | Admitting: Obstetrics & Gynecology

## 2011-03-27 DIAGNOSIS — Z9889 Other specified postprocedural states: Secondary | ICD-10-CM

## 2011-03-27 DIAGNOSIS — N898 Other specified noninflammatory disorders of vagina: Secondary | ICD-10-CM

## 2011-03-27 DIAGNOSIS — N949 Unspecified condition associated with female genital organs and menstrual cycle: Secondary | ICD-10-CM

## 2011-03-27 NOTE — Progress Notes (Signed)
C/o vaginal discharge since last Thursday, states has bad odor and is brownish discharge.

## 2011-03-27 NOTE — Progress Notes (Signed)
  Subjective:    Patient ID: Kimberly Anderson, female    DOB: 07-05-1965, 46 y.o.   MRN: [id number]  HPI  Ms. Kimberly Anderson is 2 weeks status post TAH/BSO. She denies fevers or other problems except for a smelly vaginal discharge. She denies douching or having sex. No fevers, some constipation.  Review of Systems     Objective:   Physical Exam  abd incision well healed, benign abdomen Vaginal cuff healing well, normal discharge and no odor detected      Assessment & Plan:  Subjective vaginal odor. I suspect this is from old blood. I did send off a wet prep.  RTC 1 month.

## 2011-03-28 LAB — WET PREP, GENITAL: Trich, Wet Prep: NONE SEEN

## 2011-04-24 ENCOUNTER — Ambulatory Visit (INDEPENDENT_AMBULATORY_CARE_PROVIDER_SITE_OTHER): Payer: Self-pay | Admitting: Obstetrics & Gynecology

## 2011-04-24 ENCOUNTER — Encounter: Payer: Self-pay | Admitting: Obstetrics & Gynecology

## 2011-04-24 VITALS — BP 131/87 | HR 84 | Temp 97.8°F | Ht 69.0 in | Wt 231.6 lb

## 2011-04-24 DIAGNOSIS — Z09 Encounter for follow-up examination after completed treatment for conditions other than malignant neoplasm: Secondary | ICD-10-CM

## 2011-04-24 DIAGNOSIS — Z9889 Other specified postprocedural states: Secondary | ICD-10-CM

## 2011-04-24 MED ORDER — ESTRADIOL 0.5 MG PO TABS: 0.5000 mg | ORAL_TABLET | Freq: Every day | ORAL | Status: DC

## 2011-04-24 NOTE — Progress Notes (Signed)
  Subjective:    Patient ID: Kimberly Anderson, female    DOB: 1965/11/09, 46 y.o.   MRN: [id number]  HPI  She is now 6 weeks status post TAH/BSO for a fibroid uterus (1.3 kg on pathology). She is doing well. She has not had sex yet but is ready to. She does complain of hot flashes and would like ERT.  Review of Systems  Mammogram due 10/13    Objective:   Physical Exam  Incision- healed very nicely Vaginal cuff healed well also Bimanual normal      Assessment & Plan:  Post op doing well Hot flashes- I will e prescribe estradiol 0.5 mg. If this doesn't relieve her symptoms, she will call and I will increase the dose.

## 2011-06-11 ENCOUNTER — Other Ambulatory Visit: Payer: Self-pay | Admitting: Family Medicine

## 2011-10-01 ENCOUNTER — Emergency Department (HOSPITAL_COMMUNITY)
Admission: EM | Admit: 2011-10-01 | Discharge: 2011-10-01 | Disposition: A | Discharge status: Home / Self Care | Payer: No Typology Code available for payment source | Attending: Emergency Medicine | Admitting: Emergency Medicine

## 2011-10-01 DIAGNOSIS — Z87891 Personal history of nicotine dependence: Secondary | ICD-10-CM | POA: Insufficient documentation

## 2011-10-01 DIAGNOSIS — Z043 Encounter for examination and observation following other accident: Secondary | ICD-10-CM | POA: Insufficient documentation

## 2011-10-01 DIAGNOSIS — E669 Obesity, unspecified: Secondary | ICD-10-CM | POA: Insufficient documentation

## 2011-10-01 MED ORDER — CYCLOBENZAPRINE HCL 10 MG PO TABS: 10.0000 mg | ORAL_TABLET | Freq: Two times a day (BID) | ORAL | Status: AC | PRN

## 2011-10-01 NOTE — ED Notes (Signed)
Assumed care at this time

## 2011-10-01 NOTE — ED Provider Notes (Signed)
Medical screening examination/treatment/procedure(s) were performed by non-physician practitioner and as supervising physician I was immediately available for consultation/collaboration.   Kynzleigh Bandel Y. Izzabelle Bouley, MD 10/01/11 1632 
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
## 2011-11-06 ENCOUNTER — Ambulatory Visit (INDEPENDENT_AMBULATORY_CARE_PROVIDER_SITE_OTHER): Payer: PRIVATE HEALTH INSURANCE | Admitting: Family Medicine

## 2011-11-06 ENCOUNTER — Encounter: Payer: Self-pay | Admitting: Family Medicine

## 2011-11-06 VITALS — BP 138/93 | HR 93 | Temp 98.3°F | Ht 69.0 in | Wt 238.0 lb

## 2011-11-06 DIAGNOSIS — E669 Obesity, unspecified: Secondary | ICD-10-CM

## 2011-11-06 DIAGNOSIS — M25561 Pain in right knee: Secondary | ICD-10-CM | POA: Insufficient documentation

## 2011-11-06 DIAGNOSIS — Z23 Encounter for immunization: Secondary | ICD-10-CM

## 2011-11-06 DIAGNOSIS — M79605 Pain in left leg: Secondary | ICD-10-CM

## 2011-11-06 DIAGNOSIS — M79609 Pain in unspecified limb: Secondary | ICD-10-CM

## 2011-11-06 DIAGNOSIS — N951 Menopausal and female climacteric states: Secondary | ICD-10-CM

## 2011-11-06 DIAGNOSIS — R232 Flushing: Secondary | ICD-10-CM

## 2011-11-06 DIAGNOSIS — D649 Anemia, unspecified: Secondary | ICD-10-CM

## 2011-11-06 DIAGNOSIS — M79604 Pain in right leg: Secondary | ICD-10-CM

## 2011-11-06 DIAGNOSIS — Z0000 Encounter for general adult medical examination without abnormal findings: Secondary | ICD-10-CM

## 2011-11-06 DIAGNOSIS — D509 Iron deficiency anemia, unspecified: Secondary | ICD-10-CM

## 2011-11-06 DIAGNOSIS — E8941 Symptomatic postprocedural ovarian failure: Secondary | ICD-10-CM | POA: Insufficient documentation

## 2011-11-06 LAB — CBC
HCT: 39.4 % (ref 36.0–46.0)
MCH: 26.7 pg (ref 26.0–34.0)
MCHC: 32.2 g/dL (ref 30.0–36.0)
MCV: 82.8 fL (ref 78.0–100.0)
RDW: 14.4 % (ref 11.5–15.5)

## 2011-11-06 MED ORDER — ESTRADIOL 0.025 MG/24HR TD PTWK: 1.0000 | MEDICATED_PATCH | TRANSDERMAL | Status: DC

## 2011-11-06 NOTE — Assessment & Plan Note (Signed)
Significant hot flashes throughout day and night -Start transdermal estrogen therapy -Discussed goal is to use short-term, 1-2 years -Follow-up in 1 month to re-assess symptoms

## 2011-11-06 NOTE — Assessment & Plan Note (Signed)
-  Will check CBC and iron panel

## 2011-11-06 NOTE — Assessment & Plan Note (Signed)
-  May be due to early arthritis. Encouraged trying Tylenol first since may use more routinely. If does not help, may continue to use ibuprofen. She takes about 1 daily.  -Encouraged weight loss. Her weight gain since tobacco cessation likely contributing. She is trying to exercise (walk) and eat healthier. She declined nutritionist/health coach at this time.

## 2011-11-06 NOTE — Assessment & Plan Note (Signed)
-  Encouraged scheduling mammogram (last year normal) -Tdap today  -Will check LDL (she already ate breakfast)   Follow-up in 1 year

## 2011-11-06 NOTE — Progress Notes (Signed)
  Subjective:    Patient ID: Kimberly Anderson, female    DOB: 07-Apr-1965, 46 y.o.   MRN: [id number]  HPI Physical  # Preventative  # Hot flashes ever since total hysterectomy in January 2013 She has them several times during the time and nighttime She is not sure if she would like HRT  Risk factors: -Cancer: father had prostate cancer; aunt had breast cancer in her 63s -Tobacco: quit 1 year ago  -Menarche: age 26 -Menopause: surgical, age 40 -Cardiovascular disease: no diabetes, hypertension, or hyperlipidemia (LDL 100s last year)  # Lower leg pain x 6 months It is gradually worsening It is worse when she initiates movement and improves with prolonged activity Ibuprofen 1 tablet at a time helps her symptoms  She denies trauma, tingling/numbness.  # History of anemia from menorrhagia from fibroids She is requesting iron re-checked She is taking Fe daily.  ROS: denies lightheadedness  Review of Systems Per HPI Denies vaginal discharge or irritation  Allergies, medication, past medical history reviewed.     Objective:   Physical Exam GEN: NAD; obese; well-appearing CV: RRR, no m/r/g PULM: NI WOB; CTAB ABD: obese, soft, NT EXT: non-pitting pretibial/pedal edema bilaterally; mild TTP tibia bilaterally  NEURO:   LEGS: sensation intact; 5/5 motor strength   GAIT: non-antalgic     Assessment & Plan:

## 2011-11-06 NOTE — Patient Instructions (Addendum)
For hot flashes: -Try the estrogen patch -Follow-up in 1 month   For the leg pain: -Try Tylenol 1 mg up to twice a day or ibuprofen 600 mg every 8 hours as needed -Weight loss will help -If the leg pain worsens, please follow-up  If your lab results are normal, I will send you a letter with the results. If abnormal, someone at the clinic will get in touch with you.   Schedule a mammogram

## 2011-11-07 LAB — IBC PANEL
%SAT: 32 % (ref 20–55)
TIBC: 367 ug/dL (ref 250–470)
UIBC: 249 ug/dL (ref 125–400)

## 2011-11-08 ENCOUNTER — Encounter: Payer: Self-pay | Admitting: Family Medicine

## 2011-12-11 ENCOUNTER — Ambulatory Visit: Payer: PRIVATE HEALTH INSURANCE | Admitting: Family Medicine

## 2011-12-21 ENCOUNTER — Emergency Department (HOSPITAL_COMMUNITY)
Admission: EM | Admit: 2011-12-21 | Discharge: 2011-12-21 | Disposition: A | Discharge status: Home / Self Care | Payer: PRIVATE HEALTH INSURANCE | Attending: Emergency Medicine | Admitting: Emergency Medicine

## 2011-12-21 ENCOUNTER — Encounter (HOSPITAL_COMMUNITY): Payer: Self-pay | Admitting: *Deleted

## 2011-12-21 DIAGNOSIS — M436 Torticollis: Secondary | ICD-10-CM | POA: Insufficient documentation

## 2011-12-21 DIAGNOSIS — M25519 Pain in unspecified shoulder: Secondary | ICD-10-CM | POA: Insufficient documentation

## 2011-12-21 DIAGNOSIS — Z87891 Personal history of nicotine dependence: Secondary | ICD-10-CM | POA: Insufficient documentation

## 2011-12-21 DIAGNOSIS — Z79899 Other long term (current) drug therapy: Secondary | ICD-10-CM | POA: Insufficient documentation

## 2011-12-21 MED ORDER — METHOCARBAMOL 500 MG PO TABS: 1000.0000 mg | ORAL_TABLET | Freq: Four times a day (QID) | ORAL | Status: DC

## 2011-12-21 MED ORDER — NAPROXEN 500 MG PO TABS: 500.0000 mg | ORAL_TABLET | Freq: Two times a day (BID) | ORAL | Status: DC

## 2011-12-21 MED ORDER — TRAMADOL HCL 50 MG PO TABS: 50.0000 mg | ORAL_TABLET | Freq: Four times a day (QID) | ORAL | Status: DC | PRN

## 2011-12-21 NOTE — ED Notes (Signed)
Pt c/o right neck and shoulder pain x's "a few weeks" reports pain is worse when moving a certain way, denies injury.

## 2011-12-21 NOTE — ED Provider Notes (Signed)
History     CSN: [id number]  Arrival date & time 12/21/11  1414   First MD Initiated Contact with Patient 12/21/11 1452      Chief Complaint  Patient presents with  . Torticollis  . Shoulder Pain    (Consider location/radiation/quality/duration/timing/severity/associated sxs/prior treatment) HPI Comments: Patient presents with complaint of right shoulder pain that she's had for the past 2 weeks. Pain is worse with movement and turning her head. Patient denies injury at onset. Pain has been persistent despite using Flexeril and ibuprofen at home. Patient denies headaches or vision change. She denies numbness, weakness, tingling of her upper extremities. Onset was gradual. Course is constant. Nothing makes symptoms better.  The history is provided by the patient.    Past Medical History  Diagnosis Date  . 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 No pertinent past medical history   . Obesity   . Fibroids     Past Surgical History  Procedure Date  . Bilateral tubal ligation 1996  . Svd     x 3  . Abdominal hysterectomy 03/11/2011    Procedure: HYSTERECTOMY ABDOMINAL;  Surgeon: Hollie Salk C. Marice Potter, MD;  Location: [facility];  Service: Gynecology;  Laterality: N/A;  Possible removal of tubes & ovaries  . Salpingoophorectomy 03/11/2011    Procedure: SALPINGO OOPHERECTOMY;  Surgeon: Myra C. Marice Potter, MD;  Location: [facility];  Service: Gynecology;  Laterality: Bilateral;    Family History  Problem Relation Age of Onset  . Diabetes type II Mother     Not currently on insulin but had been at one time  . Hypertension Mother   . Hyperlipidemia Mother   . Hypertension Father   . Hyperlipidemia Father   . Prostate cancer Father 3    s/p radiation  . Stroke Father 36  . Coronary artery disease Mother     s/p stents  . Fibroids Sister     s/p fibroidectomy 2/2 dub    History  Substance Use Topics  . Smoking status: Former Smoker -- 0.3 packs/day for 35 years    Types:  Cigarettes    Quit date: 06/06/2010  . Smokeless tobacco: Never Used  . Alcohol Use: No    OB History    Grav Para Term Preterm Abortions TAB SAB Ect Mult Living   4 3 3  0 1  1         Review of Systems  Constitutional: Negative for activity change.  HENT: Positive for neck pain.   Eyes: Negative for visual disturbance.  Musculoskeletal: Positive for myalgias. Negative for back pain, joint swelling and arthralgias.  Skin: Negative for wound.  Neurological: Negative for weakness and numbness.    Allergies  Review of patient's allergies indicates no known allergies.  Home Medications   Current Outpatient Rx  Name Route Sig Dispense Refill  . CALTRATE 600+D PO Oral Take 600 mg by mouth daily.    . IBUPROFEN 200 MG PO TABS Oral Take 400 mg by mouth every 6 (six) hours as needed. Pain    . IBUPROFEN-DIPHENHYDRAMINE HCL 200-25 MG PO CAPS Oral Take 1 tablet by mouth at bedtime as needed. sleep    . MELATONIN 3 MG PO CAPS Oral Take 3 mg by mouth at bedtime.    . ADULT MULTIVITAMIN W/MINERALS CH Oral Take 1 tablet by mouth every evening.    Marland Kitchen NIACIN 100 MG PO TABS Oral Take 100 mg by mouth every evening.    Marland Kitchen  OVER THE COUNTER MEDICATION Oral Take 1 tablet by mouth 2 (two) times daily. Green Bean Coffee Diet Supplement    . VITAMIN B-12 100 MCG PO TABS Oral Take 100 mcg by mouth every evening.      BP 122/86  Pulse 78  Temp 98.4 F (36.9 C) (Oral)  Resp 20  SpO2 100%  LMP 03/05/2011  Physical Exam  Nursing note and vitals reviewed. Constitutional: She appears well-developed and well-nourished.  HENT:  Head: Normocephalic and atraumatic.  Eyes: Conjunctivae normal are normal.  Neck: Normal range of motion. Neck supple. Muscular tenderness present. No spinous process tenderness present. Carotid bruit is not present. No rigidity. Normal range of motion present.       Pain with rightward rotation but full range of motion.  Pulmonary/Chest: No respiratory distress.    Musculoskeletal:       Cervical back: She exhibits tenderness. She exhibits normal range of motion and no bony tenderness.       Back:  Neurological: She is alert.  Skin: Skin is warm and dry.  Psychiatric: She has a normal mood and affect.    ED Course  Procedures (including critical care time)  Labs Reviewed - No data to display No results found.   1. Torticollis     3:04 PM Patient seen and examined.   Vital signs reviewed and are as follows: Filed Vitals:   12/21/11 1421  BP: 122/86  Pulse: 78  Temp: 98.4 F (36.9 C)  Resp: 20   Patient counseled on proper use of muscle relaxant medication.  They were told not to drink alcohol, drive any vehicle, or do any dangerous activities while taking this medication.  Patient verbalized understanding.  Patient counseled on use of narcotic pain medications. Counseled not to combine these medications with others containing tylenol. Urged not to drink alcohol, drive, or perform any other activities that requires focus while taking these medications. The patient verbalizes understanding and agrees with the plan.    MDM  Patient with muscle spasm and pain in right neck consistent with torticollis. Will switch muscle accident. Will patient continue NSAID. Ultram prescribed. Patient urged to continue stretching exercises. No concern for radicular-type symptoms. No concern for vertebral or carotid artery problem. PCP followup indicated if not improved in several weeks.        Renne Crigler, [place] 12/21/11 1517

## 2011-12-21 NOTE — ED Provider Notes (Signed)
Medical screening examination/treatment/procedure(s) were performed by non-physician practitioner and as supervising physician I was immediately available for consultation/collaboration.   Raeford Razor, MD 12/21/11 1745

## 2012-01-08 ENCOUNTER — Ambulatory Visit: Payer: PRIVATE HEALTH INSURANCE | Admitting: Family Medicine

## 2012-01-09 DIAGNOSIS — M542 Cervicalgia: Secondary | ICD-10-CM | POA: Insufficient documentation

## 2012-01-09 DIAGNOSIS — D649 Anemia, unspecified: Secondary | ICD-10-CM | POA: Insufficient documentation

## 2012-01-09 DIAGNOSIS — R232 Flushing: Secondary | ICD-10-CM | POA: Insufficient documentation

## 2012-01-09 DIAGNOSIS — R5383 Other fatigue: Secondary | ICD-10-CM | POA: Insufficient documentation

## 2012-02-24 ENCOUNTER — Encounter (HOSPITAL_COMMUNITY): Payer: Self-pay | Admitting: *Deleted

## 2012-02-24 ENCOUNTER — Emergency Department (HOSPITAL_COMMUNITY): Payer: PRIVATE HEALTH INSURANCE

## 2012-02-24 ENCOUNTER — Emergency Department (HOSPITAL_COMMUNITY)
Admission: EM | Admit: 2012-02-24 | Discharge: 2012-02-25 | Disposition: A | Discharge status: Home / Self Care | Payer: PRIVATE HEALTH INSURANCE | Attending: Emergency Medicine | Admitting: Emergency Medicine

## 2012-02-24 DIAGNOSIS — Z9851 Tubal ligation status: Secondary | ICD-10-CM | POA: Insufficient documentation

## 2012-02-24 DIAGNOSIS — Z87891 Personal history of nicotine dependence: Secondary | ICD-10-CM | POA: Insufficient documentation

## 2012-02-24 DIAGNOSIS — Z8742 Personal history of other diseases of the female genital tract: Secondary | ICD-10-CM | POA: Insufficient documentation

## 2012-02-24 DIAGNOSIS — Z79899 Other long term (current) drug therapy: Secondary | ICD-10-CM | POA: Insufficient documentation

## 2012-02-24 DIAGNOSIS — R748 Abnormal levels of other serum enzymes: Secondary | ICD-10-CM | POA: Insufficient documentation

## 2012-02-24 DIAGNOSIS — R109 Unspecified abdominal pain: Secondary | ICD-10-CM | POA: Insufficient documentation

## 2012-02-24 DIAGNOSIS — K8020 Calculus of gallbladder without cholecystitis without obstruction: Secondary | ICD-10-CM | POA: Insufficient documentation

## 2012-02-24 DIAGNOSIS — D72829 Elevated white blood cell count, unspecified: Secondary | ICD-10-CM | POA: Insufficient documentation

## 2012-02-24 DIAGNOSIS — Z8719 Personal history of other diseases of the digestive system: Secondary | ICD-10-CM | POA: Insufficient documentation

## 2012-02-24 DIAGNOSIS — E669 Obesity, unspecified: Secondary | ICD-10-CM | POA: Insufficient documentation

## 2012-02-24 DIAGNOSIS — Z90710 Acquired absence of both cervix and uterus: Secondary | ICD-10-CM | POA: Insufficient documentation

## 2012-02-24 DIAGNOSIS — M549 Dorsalgia, unspecified: Secondary | ICD-10-CM | POA: Insufficient documentation

## 2012-02-24 LAB — COMPREHENSIVE METABOLIC PANEL
Alkaline Phosphatase: 77 U/L (ref 39–117)
BUN: 11 mg/dL (ref 6–23)
Calcium: 9.6 mg/dL (ref 8.4–10.5)
GFR calc Af Amer: 87 mL/min — ABNORMAL LOW (ref >90)
Glucose, Bld: 134 mg/dL — ABNORMAL HIGH (ref 70–99)
Potassium: 3.7 mEq/L (ref 3.5–5.1)
Total Protein: 7.5 g/dL (ref 6.0–8.3)

## 2012-02-24 LAB — URINALYSIS, ROUTINE W REFLEX MICROSCOPIC
Bilirubin Urine: NEGATIVE
Hgb urine dipstick: NEGATIVE
Specific Gravity, Urine: 1.027 (ref 1.005–1.030)
pH: 7.5 (ref 5.0–8.0)

## 2012-02-24 LAB — CBC WITH DIFFERENTIAL/PLATELET
Eosinophils Absolute: 0 10*3/uL (ref 0.0–0.7)
Eosinophils Relative: 0 % (ref 0–5)
Hemoglobin: 12.5 g/dL (ref 12.0–15.0)
Lymphs Abs: 1.5 10*3/uL (ref 0.7–4.0)
MCH: 27.2 pg (ref 26.0–34.0)
MCV: 80 fL (ref 78.0–100.0)
Monocytes Relative: 4 % (ref 3–12)
RBC: 4.59 MIL/uL (ref 3.87–5.11)

## 2012-02-24 LAB — WET PREP, GENITAL: Clue Cells Wet Prep HPF POC: NONE SEEN

## 2012-02-24 LAB — URINE MICROSCOPIC-ADD ON

## 2012-02-24 MED ORDER — MORPHINE SULFATE 4 MG/ML IJ SOLN
4.0000 mg | Freq: Once | INTRAMUSCULAR | Status: AC
  Administered 2012-02-24: 4 mg via INTRAVENOUS
  Filled 2012-02-24: qty 1

## 2012-02-24 NOTE — ED Notes (Signed)
Pt c/o abd pain/flank pain that started today around 1500. Pt reports feeling nauseated denies vomiting.

## 2012-02-25 LAB — GC/CHLAMYDIA PROBE AMP: CT Probe RNA: NEGATIVE

## 2012-02-25 MED ORDER — DOXYCYCLINE HYCLATE 100 MG PO CAPS: 100.0000 mg | ORAL_CAPSULE | Freq: Two times a day (BID) | ORAL | Status: DC

## 2012-02-25 MED ORDER — OXYCODONE-ACETAMINOPHEN 5-325 MG PO TABS
1.0000 | ORAL_TABLET | Freq: Once | ORAL | Status: AC
  Administered 2012-02-25: 1 via ORAL
  Filled 2012-02-25: qty 1

## 2012-02-25 MED ORDER — DICYCLOMINE HCL 20 MG PO TABS: 20.0000 mg | ORAL_TABLET | Freq: Two times a day (BID) | ORAL | Status: DC

## 2012-02-25 MED ORDER — IOHEXOL 300 MG/ML  SOLN
100.0000 mL | Freq: Once | INTRAMUSCULAR | Status: AC | PRN
  Administered 2012-02-25: 100 mL via INTRAVENOUS

## 2012-02-25 MED ORDER — IBUPROFEN 600 MG PO TABS: 600.0000 mg | ORAL_TABLET | Freq: Four times a day (QID) | ORAL | Status: DC | PRN

## 2012-02-25 MED ORDER — TRAMADOL HCL 50 MG PO TABS: 50.0000 mg | ORAL_TABLET | Freq: Four times a day (QID) | ORAL | Status: DC | PRN

## 2012-02-25 NOTE — ED Notes (Signed)
MD at bedside. 

## 2012-02-25 NOTE — ED Provider Notes (Addendum)
History     CSN: [id number]  Arrival date & time 02/24/12  Kimberly Anderson   First MD Initiated Contact with Patient 02/24/12 2141      Chief Complaint  Patient presents with  . Abdominal Pain  . Back Pain    (Consider location/radiation/quality/duration/timing/severity/associated sxs/prior treatment) HPI Comments: PT comes in with cc of abd pain, and back pain. Pt is s/o TAH, BSO due to fibroids. Her current pain started this am, and is located in the suprapubic region and radiates to the left back. She has no n/v/f/c/diarrhea, last BM was today. No hx of diverticular dz. Pt has no hx of renal stones and she denies UTI like sx.   Patient is a 46 y.o. female presenting with abdominal pain and back pain. The history is provided by the patient.  Abdominal Pain The primary symptoms of the illness include abdominal pain. The primary symptoms of the illness do not include shortness of breath, nausea, vomiting, diarrhea, dysuria or vaginal discharge.  Additional symptoms associated with the illness include back pain. Symptoms associated with the illness do not include constipation, urgency or hematuria.  Back Pain  Associated symptoms include abdominal pain. Pertinent negatives include no chest pain and no dysuria.    Past Medical History  Diagnosis Date  . 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 No pertinent past medical history   . Obesity   . Fibroids     Past Surgical History  Procedure Date  . Bilateral tubal ligation 1996  . Svd     x 3  . Abdominal hysterectomy 03/11/2011    Procedure: HYSTERECTOMY ABDOMINAL;  Surgeon: Hollie Salk C. Marice Potter, MD;  Location: [facility];  Service: Gynecology;  Laterality: N/A;  Possible removal of tubes & ovaries  . Salpingoophorectomy 03/11/2011    Procedure: SALPINGO OOPHERECTOMY;  Surgeon: Myra C. Marice Potter, MD;  Location: [facility];  Service: Gynecology;  Laterality: Bilateral;    Family History  Problem Relation Age of Onset  . Diabetes type II  Mother     Not currently on insulin but had been at one time  . Hypertension Mother   . Hyperlipidemia Mother   . Hypertension Father   . Hyperlipidemia Father   . Prostate cancer Father 66    s/p radiation  . Stroke Father 4  . Coronary artery disease Mother     s/p stents  . Fibroids Sister     s/p fibroidectomy 2/2 dub    History  Substance Use Topics  . Smoking status: Former Smoker -- 0.3 packs/day for 35 years    Types: Cigarettes    Quit date: 06/06/2010  . Smokeless tobacco: Never Used  . Alcohol Use: No    OB History    Grav Para Term Preterm Abortions TAB SAB Ect Mult Living   4 3 3  0 1  1         Review of Systems  Constitutional: Negative for activity change.  HENT: Negative for facial swelling and neck pain.   Respiratory: Negative for cough, shortness of breath and wheezing.   Cardiovascular: Negative for chest pain.  Gastrointestinal: Positive for abdominal pain. Negative for nausea, vomiting, diarrhea, constipation, blood in stool and abdominal distention.  Genitourinary: Negative for dysuria, urgency, hematuria, vaginal discharge and difficulty urinating.  Musculoskeletal: Positive for back pain.  Skin: Negative for color change.  Neurological: Negative for speech difficulty.  Hematological: Does not bruise/bleed easily.  Psychiatric/Behavioral: Negative for confusion.    Allergies  Review of patient's allergies indicates no known allergies.  Home Medications   Current Outpatient Rx  Name  Route  Sig  Dispense  Refill  . RISAQUAD PO CAPS   Oral   Take 1 capsule by mouth daily.         Marland Kitchen BLACK COHOSH 160 MG PO CAPS   Oral   Take 1 capsule by mouth daily.         Marland Kitchen CALTRATE 600+D PO   Oral   Take 600 mg by mouth daily.         Marland Kitchen ESOMEPRAZOLE MAGNESIUM 40 MG PO CPDR   Oral   Take 40 mg by mouth daily before breakfast.         . IBUPROFEN-DIPHENHYDRAMINE HCL 200-25 MG PO CAPS   Oral   Take 1 tablet by mouth at bedtime as  needed. sleep         . METHOCARBAMOL 500 MG PO TABS   Oral   Take 2 tablets (1,000 mg total) by mouth 4 (four) times daily.   20 tablet   0   . ADULT MULTIVITAMIN W/MINERALS CH   Oral   Take 1 tablet by mouth every evening.         Marland Kitchen NAPROXEN 500 MG PO TABS   Oral   Take 1 tablet (500 mg total) by mouth 2 (two) times daily.   20 tablet   0   . NIACIN 100 MG PO TABS   Oral   Take 100 mg by mouth every evening.         Marland Kitchen OVER THE COUNTER MEDICATION   Oral   Take 1 tablet by mouth 2 (two) times daily. Green Bean Coffee Diet Supplement         . TRAMADOL HCL 50 MG PO TABS   Oral   Take 1 tablet (50 mg total) by mouth every 6 (six) hours as needed for pain.   15 tablet   0   . VITAMIN B-12 100 MCG PO TABS   Oral   Take 100 mcg by mouth every evening.           BP 133/95  Pulse 88  Temp 98.1 F (36.7 C) (Oral)  Resp 24  SpO2 100%  LMP 03/05/2011  Physical Exam  Nursing note and vitals reviewed. Constitutional: She is oriented to person, place, and time. She appears well-developed.  HENT:  Head: Normocephalic and atraumatic.  Eyes: Conjunctivae normal and EOM are normal. Pupils are equal, round, and reactive to light.  Neck: Normal range of motion. Neck supple.  Cardiovascular: Normal rate, regular rhythm, normal heart sounds and intact distal pulses.   No murmur heard. Pulmonary/Chest: Effort normal and breath sounds normal. No respiratory distress. She has no wheezes.  Abdominal: Soft. Bowel sounds are normal. She exhibits no distension. There is tenderness. There is no rebound and no guarding.       Suprapubic tenderness, no CVA tenderness  Genitourinary: Vagina normal and uterus normal.       External exam - normal, no lesions Speculum exam: Pt has some white discharge, no blood Bimanual exam: Patient has no CMT, no adnexal tenderness or fullness and cervical os is closed  Neurological: She is alert and oriented to person, place, and time.  Skin:  Skin is warm and dry.    ED Course  Procedures (including critical care time)  Labs Reviewed  URINALYSIS, ROUTINE W REFLEX MICROSCOPIC - Abnormal; Notable for the following:  APPearance TURBID (*)     All other components within normal limits  CBC WITH DIFFERENTIAL - Abnormal; Notable for the following:    WBC 14.6 (*)     Neutrophils Relative 86 (*)     Neutro Abs 12.5 (*)     Lymphocytes Relative 10 (*)     All other components within normal limits  COMPREHENSIVE METABOLIC PANEL - Abnormal; Notable for the following:    Glucose, Bld 134 (*)     AST 192 (*)     ALT 111 (*)     GFR calc non Af Amer 75 (*)     GFR calc Af Amer 87 (*)     All other components within normal limits  WET PREP, GENITAL - Abnormal; Notable for the following:    WBC, Wet Prep HPF POC RARE (*)     All other components within normal limits  URINE MICROSCOPIC-ADD ON  GC/CHLAMYDIA PROBE AMP   No results found.   No diagnosis found.    MDM  DDx includes:  SBO Colitis AAA Tumors Colitis Intra abdominal abscess Thrombosis Mesenteric ischemia Diverticulitis Hernia Nephrolithiasis Pyelonephritis UTI/Cystitis PID STD  Pt comes in with cc of abd pain. She reports having had TAH, BSO, but she is not 100% sure. Her tenderness is in the suprapubic region, and she had some tenderness on the pelvic exam as well. She has leukocytosis as well. We will get CT abd due to the sudden onset pain, with unknown surgical hx.  Elevated liver enz, unknown etiology. No risk for hepatitis, and no RUQ pain. Requested her to have the liver enz checked in 6 months to 1 year.  Pt's care will be transferred to Dr. Ranae Palms, who will f/u on CT results.   Derwood Kaplan, MD 02/25/12 0028  Derwood Kaplan, MD 02/25/12 [phone]

## 2012-02-28 ENCOUNTER — Ambulatory Visit (INDEPENDENT_AMBULATORY_CARE_PROVIDER_SITE_OTHER): Payer: PRIVATE HEALTH INSURANCE | Admitting: Family Medicine

## 2012-02-28 ENCOUNTER — Encounter: Payer: Self-pay | Admitting: Family Medicine

## 2012-02-28 VITALS — BP 146/87 | HR 90 | Temp 98.1°F | Ht 69.0 in | Wt 241.2 lb

## 2012-02-28 DIAGNOSIS — R109 Unspecified abdominal pain: Secondary | ICD-10-CM | POA: Insufficient documentation

## 2012-02-28 DIAGNOSIS — E8941 Symptomatic postprocedural ovarian failure: Secondary | ICD-10-CM

## 2012-02-28 LAB — COMPREHENSIVE METABOLIC PANEL
ALT: 112 U/L — ABNORMAL HIGH (ref 0–35)
AST: 31 U/L (ref 0–37)
Albumin: 4.4 g/dL (ref 3.5–5.2)
BUN: 12 mg/dL (ref 6–23)
Calcium: 9.9 mg/dL (ref 8.4–10.5)
Chloride: 103 mEq/L (ref 96–112)
Potassium: 4.4 mEq/L (ref 3.5–5.3)
Sodium: 139 mEq/L (ref 135–145)
Total Protein: 7 g/dL (ref 6.0–8.3)

## 2012-02-28 NOTE — Assessment & Plan Note (Signed)
She presented to the ED on 12/24 with significant periumbilical abdominal pain and elevated AST/ALT and CT-abdomen showing gallstones with common bile duct at the upper end of normal. She may have passed a gallstone. Her pain has improved dramatically since then, and she denies other GI symptoms.  We spent several minutes discussing gallbladder disease and what may have happened to her. We will re-check CMET today. If normal, we will monitor symptoms. If LFTs remain elevated, consider checking RUQ ultrasound to evaluate gallbladder and referral to GI; she was given the number to call [facility] [phone]) in the ED as needed. Her current pain may be due to a muscle strain. It does not seem related to an intra-abdominal process. She was advised to try warm compresses and OTC analgesics as needed.

## 2012-02-28 NOTE — Assessment & Plan Note (Signed)
She used estrogen patch for 1 week but did not notice a significant improvement in hot flashes and stopped using because she was concerned about side effect profile, although she denies significant side effects. We discussed other options, notably using Effexor. Patient would like more time to think about options. She will let me know if she wants to start Effexor.

## 2012-02-28 NOTE — Patient Instructions (Addendum)
I will call you with the lab results  For your side pain:  -Try ibuprofen 600 mg every 6 hours as needed. Always take with food.   Let me know if you want to start Effexor for your hot flashes.

## 2012-02-28 NOTE — Progress Notes (Signed)
  Subjective:    Patient ID: Kimberly Anderson, female    DOB: February 07, 1966, 46 y.o.   MRN: [id number]  HPI She is here to follow-up from ED visit for abdominal pain. She presented on 12/23 with severe acute periumbilical pain. Her AST, ALT were elevated, and CT abdomen showed gallstones. Common bile duct upper end of normal at 6 mm. She received pain medications in the ED.  Since that visit, her pain has improved significantly. It was 10/10 and today it is 5/10. She takes Tramadol intermittently, but she says does not help significantly. She is not taking any other medications.  She denies nausea/vomiting.  Her pain is now more on her right flank area.   Review of Systems Per HPI Denies itching, constipation, dysuria/frequency/urgency  Allergies, medication, past medical history reviewed.  -Hot flashes: She used estrogen patch for 1 week but did not notice a significant improvement in hot flashes and stopped using because she was concerned about side effect profile, although she denies significant side effects.     Objective:   Physical Exam Gen: NAD; well-appearing; obese PULM: NI WOB ABD: NABS, soft, NT, ND MSK: mild tenderness along right superior iliac crest BACK: no CVA tenderness SKIN: warm, dry, no rash     Assessment & Plan:

## 2012-03-04 ENCOUNTER — Telehealth: Payer: Self-pay | Admitting: Family Medicine

## 2012-03-04 DIAGNOSIS — R109 Unspecified abdominal pain: Secondary | ICD-10-CM

## 2012-03-04 NOTE — Telephone Encounter (Signed)
Telephone call to discuss lab results. AST now normal, ALT still elevated 110s but stable from before.  Her abdominal pain is resolved; she has some back and side pain now.  We will hold her seeing GI at this time. She will follow-up as needed. If her abdominal pain returns, she will follow-up for repeat labs. If severe, she was advised to go to ED.   

## 2012-03-04 NOTE — Assessment & Plan Note (Signed)
Telephone call to discuss lab results. AST now normal, ALT still elevated 110s but stable from before.  Her abdominal pain is resolved; she has some back and side pain now.  We will hold her seeing GI at this time. She will follow-up as needed. If her abdominal pain returns, she will follow-up for repeat labs. If severe, she was advised to go to ED.

## 2012-06-14 ENCOUNTER — Other Ambulatory Visit: Payer: Self-pay | Admitting: Obstetrics & Gynecology

## 2012-06-15 ENCOUNTER — Telehealth: Payer: Self-pay | Admitting: Obstetrics and Gynecology

## 2012-06-15 DIAGNOSIS — R232 Flushing: Secondary | ICD-10-CM

## 2012-06-15 MED ORDER — ESTRADIOL 0.5 MG PO TABS: 0.5000 mg | ORAL_TABLET | Freq: Every day | ORAL | Status: DC

## 2012-06-15 NOTE — Telephone Encounter (Signed)
Patient called requesting Estradiol RF that was prescribed to her from 04/2011 by Dr. Marice Potter for hot flashes. Per Dr. Starling Manns to Refill for one month only until she comes back for follow-up to refill the prescription. Patient states she is expecting her insurance in one month and so she will call then to make an appointment. Patient notified of Rf sent to pharm on file. Patient satisfied.

## 2012-06-23 ENCOUNTER — Telehealth: Payer: Self-pay | Admitting: Family Medicine

## 2012-06-23 NOTE — Telephone Encounter (Signed)
Gave message and she has to wait until she gets her insurance card and then will make appt.

## 2012-06-23 NOTE — Telephone Encounter (Signed)
Patient wants to speak to Dr. Madolyn Frieze about changing the [facility] to the patch.

## 2012-06-23 NOTE — Telephone Encounter (Signed)
LMOVM for pt to call back.  Our records indicated that she did not like the estrogen patches.  She is also due for a follow up.  Please have her schedule an appt when she calls back. Kimberly Anderson, Kimberly Anderson

## 2012-11-06 ENCOUNTER — Encounter: Payer: PRIVATE HEALTH INSURANCE | Admitting: Family Medicine

## 2012-12-16 ENCOUNTER — Other Ambulatory Visit: Payer: Self-pay

## 2012-12-16 DIAGNOSIS — Z1231 Encounter for screening mammogram for malignant neoplasm of breast: Secondary | ICD-10-CM

## 2013-01-14 ENCOUNTER — Ambulatory Visit: Payer: PRIVATE HEALTH INSURANCE

## 2013-02-23 ENCOUNTER — Ambulatory Visit
Admission: RE | Admit: 2013-02-23 | Discharge: 2013-02-23 | Disposition: A | Discharge status: Home / Self Care | Payer: BC Managed Care – PPO | Source: Ambulatory Visit

## 2013-02-23 ENCOUNTER — Ambulatory Visit: Payer: PRIVATE HEALTH INSURANCE

## 2013-02-23 DIAGNOSIS — Z1231 Encounter for screening mammogram for malignant neoplasm of breast: Secondary | ICD-10-CM

## 2013-03-09 ENCOUNTER — Other Ambulatory Visit: Payer: Self-pay | Admitting: Internal Medicine

## 2013-03-09 DIAGNOSIS — R928 Other abnormal and inconclusive findings on diagnostic imaging of breast: Secondary | ICD-10-CM

## 2013-04-13 ENCOUNTER — Ambulatory Visit
Admission: RE | Admit: 2013-04-13 | Discharge: 2013-04-13 | Disposition: A | Discharge status: Home / Self Care | Payer: No Typology Code available for payment source | Source: Ambulatory Visit | Attending: Internal Medicine | Admitting: Internal Medicine

## 2013-04-13 DIAGNOSIS — R928 Other abnormal and inconclusive findings on diagnostic imaging of breast: Secondary | ICD-10-CM

## 2013-04-19 ENCOUNTER — Encounter: Payer: Self-pay | Admitting: Advanced Practice Midwife

## 2013-05-06 ENCOUNTER — Ambulatory Visit: Payer: Self-pay | Admitting: Obstetrics & Gynecology

## 2013-05-10 ENCOUNTER — Encounter: Payer: Self-pay | Admitting: Obstetrics & Gynecology

## 2013-05-10 ENCOUNTER — Ambulatory Visit (INDEPENDENT_AMBULATORY_CARE_PROVIDER_SITE_OTHER): Payer: No Typology Code available for payment source | Admitting: Obstetrics & Gynecology

## 2013-05-10 VITALS — BP 131/82 | HR 85 | Temp 98.2°F | Wt 235.0 lb

## 2013-05-10 DIAGNOSIS — E8941 Symptomatic postprocedural ovarian failure: Secondary | ICD-10-CM

## 2013-05-10 DIAGNOSIS — Z0000 Encounter for general adult medical examination without abnormal findings: Secondary | ICD-10-CM

## 2013-05-10 LAB — POCT URINALYSIS DIPSTICK
BILIRUBIN UA: NEGATIVE
Blood, UA: NEGATIVE
GLUCOSE UA: NEGATIVE
KETONES UA: NEGATIVE
LEUKOCYTES UA: NEGATIVE
NITRITE UA: NEGATIVE
Protein, UA: NEGATIVE
Spec Grav, UA: 1.015
Urobilinogen, UA: NEGATIVE
pH, UA: 6

## 2013-05-10 MED ORDER — ESTRADIOL 0.5 MG/0.5GM TD GEL: 0.5000 mg | TRANSDERMAL | Status: DC

## 2013-05-10 NOTE — Progress Notes (Signed)
Subjective:     Kimberly Anderson is a 48 y.o. female here for a routine exam.  Current complaints: pt states that she is having severe hot flashes for the past three years.  Pt states that they have recently gotten worse. Pt states that she has trouble staying asleep at night.  Pt states that she uses medication to help with this, tylenol pm.  .  Personal health questionnaire reviewed: yes.   Gynecologic History Patient's last menstrual period was 03/05/2011. Contraception: status post hysterectomy Last Pap: total hysterectomy Last mammogram: 2015. Results were: normal, with dense tissue  Obstetric History OB History  Gravida Para Term Preterm AB SAB TAB Ectopic Multiple Living  4 3 3  0 1 1        # Outcome Date GA Lbr Len/2nd Weight Sex Delivery Anes PTL Lv  4 SAB           3 TRM      SVD     2 TRM      SVD     1 TRM      SVD          The following portions of the patient's history were reviewed and updated as appropriate: allergies, current medications, past family history, past medical history, past social history, past surgical history and problem list.  Review of Systems Pertinent items are noted in HPI.    Objective:     No exam today     Assessment:   Symptomatic s/p surgical menopause  Plan:    Recommend transdermal ERT Return prn or for annual f/u

## 2013-05-11 ENCOUNTER — Encounter: Payer: Self-pay | Admitting: Obstetrics & Gynecology

## 2013-05-11 NOTE — Patient Instructions (Signed)

## 2013-05-28 ENCOUNTER — Encounter: Payer: Self-pay | Admitting: Family Medicine

## 2013-05-28 ENCOUNTER — Ambulatory Visit (INDEPENDENT_AMBULATORY_CARE_PROVIDER_SITE_OTHER): Payer: No Typology Code available for payment source | Admitting: Family Medicine

## 2013-05-28 VITALS — BP 138/87 | HR 91 | Temp 98.4°F | Wt 235.0 lb

## 2013-05-28 DIAGNOSIS — Z0000 Encounter for general adult medical examination without abnormal findings: Secondary | ICD-10-CM

## 2013-05-28 NOTE — Progress Notes (Signed)
Pt showed up late to clinic and then left without being seen and w/o talking to any of the nursing staff.  Linna Darner, MD Family Medicine PGY-3 05/28/2013, 10:58 AM

## 2013-07-22 ENCOUNTER — Ambulatory Visit: Payer: No Typology Code available for payment source | Admitting: Obstetrics & Gynecology

## 2013-07-22 ENCOUNTER — Telehealth: Payer: Self-pay | Admitting: General Practice

## 2013-07-22 ENCOUNTER — Encounter: Payer: Self-pay | Admitting: General Practice

## 2013-07-22 NOTE — Telephone Encounter (Signed)
Patient no showed for appt. Called patient, no answer and cannot leave message due to VM box full.

## 2013-07-26 ENCOUNTER — Emergency Department (HOSPITAL_COMMUNITY)
Admission: EM | Admit: 2013-07-26 | Discharge: 2013-07-26 | Disposition: A | Discharge status: Home / Self Care | Payer: No Typology Code available for payment source | Source: Home / Self Care | Attending: Family Medicine | Admitting: Family Medicine

## 2013-07-26 ENCOUNTER — Encounter (HOSPITAL_COMMUNITY): Payer: Self-pay | Admitting: Emergency Medicine

## 2013-07-26 DIAGNOSIS — M545 Low back pain, unspecified: Secondary | ICD-10-CM

## 2013-07-26 HISTORY — DX: Essential (primary) hypertension: I10

## 2013-07-26 MED ORDER — TRAMADOL HCL 50 MG PO TABS: 50.0000 mg | ORAL_TABLET | Freq: Four times a day (QID) | ORAL | Status: DC | PRN

## 2013-07-26 MED ORDER — PREDNISONE 10 MG PO KIT: PACK | ORAL | Status: DC

## 2013-07-26 NOTE — ED Notes (Signed)
C/o L sided back pain onset yesterday.

## 2013-07-26 NOTE — ED Provider Notes (Signed)
Kimberly Anderson is a 48 y.o. female who presents to Urgent Care today for back pain present for one week without injury. Pain radiates to the lower leg a bit. No weakness or numbness nausea vomiting or diarrhea. No fevers chills cough congestion or trouble breathing. She's tried ibuprofen and Tylenol which have not helped. She feels well otherwise.   Past Medical History  Diagnosis Date  . 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 No pertinent past medical history   . Obesity   . Fibroids   . Gall stones   . Hypertension    History  Substance Use Topics  . Smoking status: Former Smoker -- 0.30 packs/day for 35 years    Types: Cigarettes    Quit date: 06/06/2010  . Smokeless tobacco: Never Used  . Alcohol Use: No   ROS as above Medications: No current facility-administered medications for this encounter.   Current Outpatient Prescriptions  Medication Sig Dispense Refill  . Calcium Carbonate-Vitamin D (CALTRATE 600+D PO) Take 600 mg by mouth daily.      Marland Kitchen esomeprazole (NEXIUM) 40 MG capsule Take 40 mg by mouth daily before breakfast.      . estradiol (ESTRACE) 0.5 MG tablet Take 1 tablet (0.5 mg total) by mouth daily.  30 tablet  0  . hydrochlorothiazide (MICROZIDE) 12.5 MG capsule       . Multiple Vitamin (MULTIVITAMIN WITH MINERALS) TABS Take 1 tablet by mouth every evening.      . niacin 100 MG tablet Take 100 mg by mouth every evening.      . phentermine 30 MG capsule Take 30 mg by mouth every morning.      . vitamin B-12 (CYANOCOBALAMIN) 100 MCG tablet Take 100 mcg by mouth every evening.      Marland Kitchen acidophilus (RISAQUAD) CAPS Take 1 capsule by mouth daily.      . Black Cohosh 160 MG CAPS Take 1 capsule by mouth daily.      Marland Kitchen dicyclomine (BENTYL) 20 MG tablet Take 1 tablet (20 mg total) by mouth 2 (two) times daily.  20 tablet  0  . Estradiol (DIVIGEL) 0.5 MG/0.5GM GEL Place 0.5 mg onto the skin 1 day or 1 dose.  30 each  11  . estradiol (ESTRACE) 0.5 MG  tablet TAKE 1 TABLET (0.5 MG TOTAL) BY MOUTH DAILY.  31 tablet  0  . ibuprofen (ADVIL,MOTRIN) 600 MG tablet Take 1 tablet (600 mg total) by mouth every 6 (six) hours as needed for pain.  30 tablet  0  . OVER THE COUNTER MEDICATION Take 1 tablet by mouth 2 (two) times daily. Green Bean Coffee Diet Supplement      . PredniSONE 10 MG KIT 12 day dose pack po  1 kit  0  . traMADol (ULTRAM) 50 MG tablet Take 1 tablet (50 mg total) by mouth every 6 (six) hours as needed.  15 tablet  0    Exam:  BP 123/88  Pulse 74  Temp(Src) 97.8 F (36.6 C) (Oral)  Resp 12  SpO2 100%  LMP 03/05/2011 Gen: Well NAD HEENT: EOMI,  MMM Lungs: Normal work of breathing. CTABL Heart: RRR no MRG Abd: NABS, Soft. NT, ND Exts: Brisk capillary refill, warm and well perfused.  Back: Nontender to spinal midline. Negative straight leg raise test and Faber test bilaterally. Largely strength is intact. Reflexes are equal and normal bilaterally. Normal gait. Sensation and capillary refill intact distally  No results found  for this or any previous visit (from the past 24 hour(s)). No results found.  Assessment and Plan: 48 y.o. female with lumbago with and possible left sciatica component. Plan to treat with present dose pack and tramadol. Followup with primary care provider  Discussed warning signs or symptoms. Please see discharge instructions. Patient expresses understanding.    Gregor Hams, MD 07/26/13 [phone]

## 2013-07-26 NOTE — Discharge Instructions (Signed)
Thank you for coming in today. Come back or go to the emergency room if you notice new weakness new numbness problems walking or bowel or bladder problems.  Back Pain, Adult Low back pain is very common. About 1 in 5 people have back pain.The cause of low back pain is rarely dangerous. The pain often gets better over time.About half of people with a sudden onset of back pain feel better in just 2 weeks. About 8 in 10 people feel better by 6 weeks.  CAUSES Some common causes of back pain include:  Strain of the muscles or ligaments supporting the spine.  Wear and tear (degeneration) of the spinal discs.  Arthritis.  Direct injury to the back. DIAGNOSIS Most of the time, the direct cause of low back pain is not known.However, back pain can be treated effectively even when the exact cause of the pain is unknown.Answering your caregiver's questions about your overall health and symptoms is one of the most accurate ways to make sure the cause of your pain is not dangerous. If your caregiver needs more information, he or she may order lab work or imaging tests (X-rays or MRIs).However, even if imaging tests show changes in your back, this usually does not require surgery. HOME CARE INSTRUCTIONS For many people, back pain returns.Since low back pain is rarely dangerous, it is often a condition that people can learn to [facility] their own.   Remain active. It is stressful on the back to sit or stand in one place. Do not sit, drive, or stand in one place for more than 30 minutes at a time. Take short walks on level surfaces as soon as pain allows.Try to increase the length of time you walk each day.  Do not stay in bed.Resting more than 1 or 2 days can delay your recovery.  Do not avoid exercise or work.Your body is made to move.It is not dangerous to be active, even though your back may hurt.Your back will likely heal faster if you return to being active before your pain is gone.  Pay  attention to your body when you bend and lift. Many people have less discomfortwhen lifting if they bend their knees, keep the load close to their bodies,and avoid twisting. Often, the most comfortable positions are those that put less stress on your recovering back.  Find a comfortable position to sleep. Use a firm mattress and lie on your side with your knees slightly bent. If you lie on your back, put a pillow under your knees.  Only take over-the-counter or prescription medicines as directed by your caregiver. Over-the-counter medicines to reduce pain and inflammation are often the most helpful.Your caregiver may prescribe muscle relaxant drugs.These medicines help dull your pain so you can more quickly return to your normal activities and healthy exercise.  Put ice on the injured area.  Put ice in a plastic bag.  Place a towel between your skin and the bag.  Leave the ice on for 15-20 minutes, 03-04 times a day for the first 2 to 3 days. After that, ice and heat may be alternated to reduce pain and spasms.  Ask your caregiver about trying back exercises and gentle massage. This may be of some benefit.  Avoid feeling anxious or stressed.Stress increases muscle tension and can worsen back pain.It is important to recognize when you are anxious or stressed and learn ways to manage it.Exercise is a great option. SEEK MEDICAL CARE IF:  You have pain that is not  relieved with rest or medicine.  You have pain that does not improve in 1 week.  You have new symptoms.  You are generally not feeling well. SEEK IMMEDIATE MEDICAL CARE IF:   You have pain that radiates from your back into your legs.  You develop new bowel or bladder control problems.  You have unusual weakness or numbness in your arms or legs.  You develop nausea or vomiting.  You develop abdominal pain.  You feel faint. Document Released: 02/18/2005 Document Revised: 08/20/2011 Document Reviewed:  07/09/2010 [facility] Patient Information 2014 [place], [place].

## 2014-01-03 ENCOUNTER — Encounter (HOSPITAL_COMMUNITY): Payer: Self-pay | Admitting: Emergency Medicine

## 2014-02-28 ENCOUNTER — Encounter: Payer: Self-pay | Admitting: *Deleted

## 2014-03-01 ENCOUNTER — Encounter: Payer: Self-pay | Admitting: Obstetrics & Gynecology

## 2014-05-11 ENCOUNTER — Ambulatory Visit: Payer: No Typology Code available for payment source | Admitting: Obstetrics & Gynecology

## 2014-09-26 ENCOUNTER — Emergency Department (HOSPITAL_COMMUNITY)
Admission: EM | Admit: 2014-09-26 | Discharge: 2014-09-26 | Disposition: A | Discharge status: Home / Self Care | Payer: No Typology Code available for payment source | Attending: Physician Assistant | Admitting: Physician Assistant

## 2014-09-26 ENCOUNTER — Encounter (HOSPITAL_COMMUNITY): Payer: Self-pay | Admitting: *Deleted

## 2014-09-26 DIAGNOSIS — E669 Obesity, unspecified: Secondary | ICD-10-CM | POA: Insufficient documentation

## 2014-09-26 DIAGNOSIS — Z8719 Personal history of other diseases of the digestive system: Secondary | ICD-10-CM | POA: Insufficient documentation

## 2014-09-26 DIAGNOSIS — Z87891 Personal history of nicotine dependence: Secondary | ICD-10-CM | POA: Insufficient documentation

## 2014-09-26 DIAGNOSIS — Z86018 Personal history of other benign neoplasm: Secondary | ICD-10-CM | POA: Insufficient documentation

## 2014-09-26 DIAGNOSIS — Z79899 Other long term (current) drug therapy: Secondary | ICD-10-CM | POA: Insufficient documentation

## 2014-09-26 DIAGNOSIS — M545 Low back pain: Secondary | ICD-10-CM | POA: Insufficient documentation

## 2014-09-26 DIAGNOSIS — I10 Essential (primary) hypertension: Secondary | ICD-10-CM | POA: Insufficient documentation

## 2014-09-26 LAB — URINALYSIS, ROUTINE W REFLEX MICROSCOPIC
BILIRUBIN URINE: NEGATIVE
GLUCOSE, UA: NEGATIVE mg/dL
Hgb urine dipstick: NEGATIVE
Ketones, ur: NEGATIVE mg/dL
NITRITE: NEGATIVE
PH: 6 (ref 5.0–8.0)
Protein, ur: NEGATIVE mg/dL
Specific Gravity, Urine: 1.016 (ref 1.005–1.030)
UROBILINOGEN UA: 0.2 mg/dL (ref 0.0–1.0)

## 2014-09-26 LAB — URINE MICROSCOPIC-ADD ON

## 2014-09-26 MED ORDER — NAPROXEN 250 MG PO TABS: 250.0000 mg | ORAL_TABLET | Freq: Two times a day (BID) | ORAL | Status: DC

## 2014-09-26 MED ORDER — METHOCARBAMOL 500 MG PO TABS: 500.0000 mg | ORAL_TABLET | Freq: Two times a day (BID) | ORAL | Status: DC | PRN

## 2014-09-26 NOTE — Discharge Instructions (Signed)

## 2014-09-26 NOTE — ED Provider Notes (Signed)
CSN: [id number]     Arrival date & time 09/26/14  1758 History  This chart was scribed for non-physician practitioner Waynetta Pean PA-C working with Macarthur Critchley, MD by Meriel Pica, ED Scribe. This patient was seen in room WTR6/WTR6 and the patient's care was started at 7:15 PM.   Chief Complaint  Patient presents with  . Back Pain   The history is provided by the patient. No language interpreter was used.   HPI Comments: Ha Kimberly Anderson is a 49 y.o. female, with a PMhx of HTN, and a PShx of bilateral tubal ligation, who presents to the Emergency Department complaining of constant, 7/10, generalized left back pain onset 7 days. She denies any attributable injury to her back pain but reports she does water aerobics which she has been participating in prior to the onset of her symptoms. She states the pain intermittently radiates around to her left hip and abdomen. Her pain is exacerbated with movement, specifically sitting up and standing from a seated position. Pt reports she was evaluated today by her PCP for the same complaint who prescribed Naproxen for her back pain. Pt notes she does not believe the Naproxen will alleviate her back pain but reports she has not taken the medication yet.  Denies bowel or bladder incontinence, history of cancer, or history of IV drug abuse. Additionally denies radiation of the pain down her legs, urinary symptoms, vaginal bleeding or discharge, current abdominal pain, nausea, vomiting, diarrhea, fever or chills.   Past Medical History  Diagnosis Date  . 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 No pertinent past medical history   . Obesity   . Fibroids   . Gall stones   . Hypertension    Past Surgical History  Procedure Laterality Date  . Bilateral tubal ligation  1996  . Svd      x 3  . Abdominal hysterectomy  03/11/2011    Procedure: HYSTERECTOMY ABDOMINAL;  Surgeon: Juliene Pina C. Hulan Fray, MD;  Location: [facility];  Service:  Gynecology;  Laterality: N/A;  Possible removal of tubes & ovaries  . Salpingoophorectomy  03/11/2011    Procedure: SALPINGO OOPHERECTOMY;  Surgeon: Myra C. Hulan Fray, MD;  Location: [facility];  Service: Gynecology;  Laterality: Bilateral;   Family History  Problem Relation Age of Onset  . Diabetes type II Mother     Not currently on insulin but had been at one time  . Hypertension Mother   . Hyperlipidemia Mother   . Hypertension Father   . Hyperlipidemia Father   . Prostate cancer Father 79    s/p radiation  . Stroke Father 49  . Coronary artery disease Mother     s/p stents  . Fibroids Sister     s/p fibroidectomy 2/2 dub   History  Substance Use Topics  . Smoking status: Former Smoker -- 0.30 packs/day for 35 years    Types: Cigarettes    Quit date: 06/06/2010  . Smokeless tobacco: Never Used  . Alcohol Use: No   OB History    Gravida Para Term Preterm AB TAB SAB Ectopic Multiple Living   '4 3 3 ' 0 1  1        Review of Systems  Constitutional: Negative for fever and chills.  Gastrointestinal: Negative for nausea, vomiting, abdominal pain and diarrhea.  Genitourinary: Negative for dysuria, urgency, frequency, hematuria, decreased urine volume, vaginal bleeding, vaginal discharge and difficulty urinating.  Musculoskeletal: Positive for back pain. Negative  for gait problem, neck pain and neck stiffness.  Skin: Negative for rash.  Neurological: Negative for weakness, light-headedness, numbness and headaches.   Allergies  Review of patient's allergies indicates no known allergies.  Home Medications   Prior to Admission medications   Medication Sig Start Date End Date Taking? Authorizing Provider  acidophilus (RISAQUAD) CAPS Take 1 capsule by mouth daily.    Historical Provider, MD  Black Cohosh 160 MG CAPS Take 1 capsule by mouth daily.    Historical Provider, MD  Calcium Carbonate-Vitamin D (CALTRATE 600+D PO) Take 600 mg by mouth daily.    Historical Provider, MD  dicyclomine  (BENTYL) 20 MG tablet Take 1 tablet (20 mg total) by mouth 2 (two) times daily. 02/25/12   Julianne Rice, MD  esomeprazole (NEXIUM) 40 MG capsule Take 40 mg by mouth daily before breakfast.    Historical Provider, MD  Estradiol (DIVIGEL) 0.5 MG/0.5GM GEL Place 0.5 mg onto the skin 1 day or 1 dose. 05/10/13   Lahoma Crocker, MD  estradiol (ESTRACE) 0.5 MG tablet TAKE 1 TABLET (0.5 MG TOTAL) BY MOUTH DAILY. 06/14/12   Emily Filbert, MD  estradiol (ESTRACE) 0.5 MG tablet Take 1 tablet (0.5 mg total) by mouth daily. 06/15/12   Woodroe Mode, MD  hydrochlorothiazide (MICROZIDE) 12.5 MG capsule  04/16/13   Historical Provider, MD  ibuprofen (ADVIL,MOTRIN) 600 MG tablet Take 1 tablet (600 mg total) by mouth every 6 (six) hours as needed for pain. 02/25/12   Varney Biles, MD  methocarbamol (ROBAXIN) 500 MG tablet Take 1 tablet (500 mg total) by mouth 2 (two) times daily as needed for muscle spasms. 09/26/14   Waynetta Pean, PA-C  Multiple Vitamin (MULTIVITAMIN WITH MINERALS) TABS Take 1 tablet by mouth every evening.    Historical Provider, MD  naproxen (NAPROSYN) 250 MG tablet Take 1 tablet (250 mg total) by mouth 2 (two) times daily with a meal. 09/26/14   Waynetta Pean, PA-C  niacin 100 MG tablet Take 100 mg by mouth every evening.    Historical Provider, MD  OVER THE COUNTER MEDICATION Take 1 tablet by mouth 2 (two) times daily. Green Bean Coffee Diet Supplement    Historical Provider, MD  phentermine 30 MG capsule Take 30 mg by mouth every morning.    Historical Provider, MD  PredniSONE 10 MG KIT 12 day dose pack po 07/26/13   Gregor Hams, MD  traMADol (ULTRAM) 50 MG tablet Take 1 tablet (50 mg total) by mouth every 6 (six) hours as needed. 07/26/13   Gregor Hams, MD  vitamin B-12 (CYANOCOBALAMIN) 100 MCG tablet Take 100 mcg by mouth every evening.    Historical Provider, MD   BP 114/87 mmHg  Pulse 74  Temp(Src) 97.6 F (36.4 C) (Oral)  Resp 18  SpO2 97%  LMP 03/05/2011 Physical Exam   Constitutional: She appears well-developed and well-nourished. No distress.  HENT:  Head: Normocephalic and atraumatic.  Eyes: Right eye exhibits no discharge. Left eye exhibits no discharge.  Cardiovascular: Normal rate, regular rhythm, normal heart sounds and intact distal pulses.   Dorsalis pedis and tibialis posterior pulses 2+ bilaterally.   Pulmonary/Chest: Effort normal and breath sounds normal. No respiratory distress. She has no wheezes. She has no rales.  Abdominal: Soft. Bowel sounds are normal. She exhibits no distension. There is no tenderness. There is no guarding.  No CVA tenderness. Abd is soft and non-tender to palpation.   Musculoskeletal: Normal range of motion. She exhibits tenderness. She  exhibits no edema.  Mild left, lateral, low back tenderness; no midline neck or back tenderness; no back erythema, deformity, crepitus, or ecchymosis. Patient has 5 /5 strength to her bilateral lower extremities. She is able to ambulate without difficulty or assistance.  Neurological: She is alert. She has normal reflexes. Coordination normal.  Patellar DTRs intact bilaterally; BLE 5/5 strength and sensation intact. Able to ambulate without difficulty or assistance.   Skin: Skin is warm and dry. No rash noted. She is not diaphoretic. No erythema. No pallor.  Psychiatric: She has a normal mood and affect. Her behavior is normal.  Nursing note and vitals reviewed.  ED Course  Procedures  DIAGNOSTIC STUDIES: Oxygen Saturation is 97% on RA, normal by my interpretation.    COORDINATION OF CARE: 7:25 PM Discussed treatment plan which includes to order urinalysis with pt. Pt acknowledges and agrees to plan.   Labs Review Labs Reviewed  URINALYSIS, ROUTINE W REFLEX MICROSCOPIC (NOT AT [facility]) - Abnormal; Notable for the following:    APPearance CLOUDY (*)    Leukocytes, UA TRACE (*)    All other components within normal limits  URINE MICROSCOPIC-ADD ON - Abnormal; Notable for the  following:    Squamous Epithelial / LPF FEW (*)    All other components within normal limits  URINE CULTURE    Imaging Review No results found.   EKG Interpretation None      Filed Vitals:   09/26/14 1816 09/26/14 2021  BP: 114/87 110/81  Pulse: 74 71  Temp: 97.6 F (36.4 C)   TempSrc: Oral   Resp: 18 18  SpO2: 97%      MDM   Meds given in ED:  Medications - No data to display  Discharge Medication List as of 09/26/2014  8:17 PM    START taking these medications   Details  methocarbamol (ROBAXIN) 500 MG tablet Take 1 tablet (500 mg total) by mouth 2 (two) times daily as needed for muscle spasms., Starting 09/26/2014, Until Discontinued, Print    naproxen (NAPROSYN) 250 MG tablet Take 1 tablet (250 mg total) by mouth 2 (two) times daily with a meal., Starting 09/26/2014, Until Discontinued, Print        Final diagnoses:  Left low back pain, with sciatica presence unspecified   Patient with left low back pain that is worse with movement. She has taken nothing for treatment. The patient is afebrile and nontoxic appearing. She denies any urinary complaints, however she requests that we check her urine for a urinary tract infection. Her urinalysis returned with trace leukocytes and few squamous epithelial cells. Urine sent for culture. This does not appear to be a UTI. She has no CVA tenderness.  No neurological deficits and normal neuro exam.  Patient can walk without difficulty or assistance.  No loss of bowel or bladder control.  No concern for cauda equina.  No fever, night sweats, weight loss, h/o cancer, IVDU.  RICE protocol and pain medicine indicated and discussed with patient.  I advised the patient to follow-up with their primary care provider this week. I advised the patient to return to the emergency department with new or worsening symptoms or new concerns. The patient verbalized understanding and agreement with plan.    I personally performed the services described  in this documentation, which was scribed in my presence. The recorded information has been reviewed and is accurate.      Waynetta Pean, PA-C 09/26/14 2024  Courteney Julio Alm, MD 09/26/14 2232

## 2014-09-26 NOTE — ED Notes (Signed)
Pt complains of pain in her upper and left back for the past week. Pt states the pain is worse when she sits up and stands. Pt denies injury to her back.

## 2014-09-28 LAB — URINE CULTURE

## 2014-09-29 ENCOUNTER — Emergency Department (HOSPITAL_COMMUNITY): Payer: No Typology Code available for payment source

## 2014-09-29 ENCOUNTER — Encounter (HOSPITAL_COMMUNITY): Payer: Self-pay | Admitting: Emergency Medicine

## 2014-09-29 ENCOUNTER — Emergency Department (HOSPITAL_COMMUNITY)
Admission: EM | Admit: 2014-09-29 | Discharge: 2014-09-29 | Disposition: A | Discharge status: Home / Self Care | Payer: Self-pay | Attending: Emergency Medicine | Admitting: Emergency Medicine

## 2014-09-29 DIAGNOSIS — Z7952 Long term (current) use of systemic steroids: Secondary | ICD-10-CM | POA: Insufficient documentation

## 2014-09-29 DIAGNOSIS — Z791 Long term (current) use of non-steroidal anti-inflammatories (NSAID): Secondary | ICD-10-CM | POA: Insufficient documentation

## 2014-09-29 DIAGNOSIS — I10 Essential (primary) hypertension: Secondary | ICD-10-CM | POA: Insufficient documentation

## 2014-09-29 DIAGNOSIS — E669 Obesity, unspecified: Secondary | ICD-10-CM | POA: Insufficient documentation

## 2014-09-29 DIAGNOSIS — R109 Unspecified abdominal pain: Secondary | ICD-10-CM

## 2014-09-29 DIAGNOSIS — K8050 Calculus of bile duct without cholangitis or cholecystitis without obstruction: Secondary | ICD-10-CM | POA: Insufficient documentation

## 2014-09-29 DIAGNOSIS — Z87891 Personal history of nicotine dependence: Secondary | ICD-10-CM | POA: Insufficient documentation

## 2014-09-29 DIAGNOSIS — Z79899 Other long term (current) drug therapy: Secondary | ICD-10-CM | POA: Insufficient documentation

## 2014-09-29 DIAGNOSIS — K8020 Calculus of gallbladder without cholecystitis without obstruction: Secondary | ICD-10-CM | POA: Insufficient documentation

## 2014-09-29 DIAGNOSIS — Z86018 Personal history of other benign neoplasm: Secondary | ICD-10-CM | POA: Insufficient documentation

## 2014-09-29 LAB — COMPREHENSIVE METABOLIC PANEL
ALK PHOS: 56 U/L (ref 38–126)
ALT: 26 U/L (ref 14–54)
AST: 22 U/L (ref 15–41)
Albumin: 3.9 g/dL (ref 3.5–5.0)
Anion gap: 9 (ref 5–15)
BILIRUBIN TOTAL: 0.8 mg/dL (ref 0.3–1.2)
BUN: 9 mg/dL (ref 6–20)
CALCIUM: 9.4 mg/dL (ref 8.9–10.3)
CO2: 22 mmol/L (ref 22–32)
Chloride: 105 mmol/L (ref 101–111)
Creatinine, Ser: 0.73 mg/dL (ref 0.44–1.00)
Glucose, Bld: 92 mg/dL (ref 65–99)
Potassium: 4.3 mmol/L (ref 3.5–5.1)
SODIUM: 136 mmol/L (ref 135–145)
Total Protein: 6.4 g/dL — ABNORMAL LOW (ref 6.5–8.1)

## 2014-09-29 LAB — URINALYSIS, ROUTINE W REFLEX MICROSCOPIC
Bilirubin Urine: NEGATIVE
GLUCOSE, UA: NEGATIVE mg/dL
HGB URINE DIPSTICK: NEGATIVE
KETONES UR: NEGATIVE mg/dL
LEUKOCYTES UA: NEGATIVE
NITRITE: NEGATIVE
PH: 7 (ref 5.0–8.0)
PROTEIN: NEGATIVE mg/dL
Specific Gravity, Urine: 1.014 (ref 1.005–1.030)
Urobilinogen, UA: 0.2 mg/dL (ref 0.0–1.0)

## 2014-09-29 LAB — CBC
HEMATOCRIT: 38.3 % (ref 36.0–46.0)
Hemoglobin: 12.7 g/dL (ref 12.0–15.0)
MCH: 27.4 pg (ref 26.0–34.0)
MCHC: 33.2 g/dL (ref 30.0–36.0)
MCV: 82.5 fL (ref 78.0–100.0)
Platelets: 278 10*3/uL (ref 150–400)
RBC: 4.64 MIL/uL (ref 3.87–5.11)
RDW: 13.5 % (ref 11.5–15.5)
WBC: 8.4 10*3/uL (ref 4.0–10.5)

## 2014-09-29 LAB — LIPASE, BLOOD: Lipase: 33 U/L (ref 22–51)

## 2014-09-29 MED ORDER — MORPHINE SULFATE 4 MG/ML IJ SOLN
4.0000 mg | Freq: Once | INTRAMUSCULAR | Status: AC
  Administered 2014-09-29: 4 mg via INTRAVENOUS
  Filled 2014-09-29: qty 1

## 2014-09-29 MED ORDER — ONDANSETRON HCL 4 MG/2ML IJ SOLN
4.0000 mg | Freq: Once | INTRAMUSCULAR | Status: AC
  Administered 2014-09-29: 4 mg via INTRAVENOUS
  Filled 2014-09-29: qty 2

## 2014-09-29 MED ORDER — OXYCODONE-ACETAMINOPHEN 5-325 MG PO TABS: 1.0000 | ORAL_TABLET | ORAL | Status: DC | PRN

## 2014-09-29 NOTE — ED Provider Notes (Signed)
CSN: [id number]     Arrival date & time 09/29/14  0240 History   First MD Initiated Contact with Patient 09/29/14 0425     Chief Complaint  Patient presents with  . Abdominal Pain     (Consider location/radiation/quality/duration/timing/severity/associated sxs/prior Treatment) Patient is a 49 y.o. female presenting with abdominal pain. The history is provided by the patient.  Abdominal Pain She complains of periumbilical pain that radiates to the back. Pain is intermittent and seems to be worse after eating. She rates pain at 8/10. There is no associated nausea or vomiting. She denies fever or chills. She denies constipation or diarrhea. She has tried taking naproxen for pain without relief. She has not noted any foods making the pain worse than other foods. Nothing seems to make the pain better.  Past Medical History  Diagnosis Date  . 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 No pertinent past medical history   . Obesity   . Fibroids   . Gall stones   . Hypertension    Past Surgical History  Procedure Laterality Date  . Bilateral tubal ligation  1996  . Svd      x 3  . Abdominal hysterectomy  03/11/2011    Procedure: HYSTERECTOMY ABDOMINAL;  Surgeon: Juliene Pina C. Hulan Fray, MD;  Location: [facility];  Service: Gynecology;  Laterality: N/A;  Possible removal of tubes & ovaries  . Salpingoophorectomy  03/11/2011    Procedure: SALPINGO OOPHERECTOMY;  Surgeon: Myra C. Hulan Fray, MD;  Location: [facility];  Service: Gynecology;  Laterality: Bilateral;   Family History  Problem Relation Age of Onset  . Diabetes type II Mother     Not currently on insulin but had been at one time  . Hypertension Mother   . Hyperlipidemia Mother   . Hypertension Father   . Hyperlipidemia Father   . Prostate cancer Father 28    s/p radiation  . Stroke Father 55  . Coronary artery disease Mother     s/p stents  . Fibroids Sister     s/p fibroidectomy 2/2 dub   History  Substance Use Topics  . Smoking  status: Former Smoker -- 0.30 packs/day for 35 years    Types: Cigarettes    Quit date: 06/06/2010  . Smokeless tobacco: Never Used  . Alcohol Use: No   OB History    Gravida Para Term Preterm AB TAB SAB Ectopic Multiple Living   _0 0 1  1        Review of Systems  Gastrointestinal: Positive for abdominal pain.  All other systems reviewed and are negative.     Allergies  Review of patient's allergies indicates no known allergies.  Home Medications   Prior to Admission medications   Medication Sig Start Date End Date Taking? Authorizing Provider  acidophilus (RISAQUAD) CAPS Take 1 capsule by mouth daily.    Historical Provider, MD  Black Cohosh 160 MG CAPS Take 1 capsule by mouth daily.    Historical Provider, MD  Calcium Carbonate-Vitamin D (CALTRATE 600+D PO) Take 600 mg by mouth daily.    Historical Provider, MD  dicyclomine (BENTYL) 20 MG tablet Take 1 tablet (20 mg total) by mouth 2 (two) times daily. 02/25/12   Julianne Rice, MD  esomeprazole (NEXIUM) 40 MG capsule Take 40 mg by mouth daily before breakfast.    Historical Provider, MD  Estradiol (DIVIGEL) 0.5 MG/0.5GM GEL Place 0.5 mg onto the skin 1 day or 1 dose.  05/10/13   Lahoma Crocker, MD  estradiol (ESTRACE) 0.5 MG tablet TAKE 1 TABLET (0.5 MG TOTAL) BY MOUTH DAILY. 06/14/12   Emily Filbert, MD  estradiol (ESTRACE) 0.5 MG tablet Take 1 tablet (0.5 mg total) by mouth daily. 06/15/12   Woodroe Mode, MD  hydrochlorothiazide (MICROZIDE) 12.5 MG capsule  04/16/13   Historical Provider, MD  ibuprofen (ADVIL,MOTRIN) 600 MG tablet Take 1 tablet (600 mg total) by mouth every 6 (six) hours as needed for pain. 02/25/12   Varney Biles, MD  lisinopril-hydrochlorothiazide (PRINZIDE,ZESTORETIC) 10-12.5 MG per tablet Take 1 tablet by mouth daily. 09/19/14   Historical Provider, MD  methocarbamol (ROBAXIN) 500 MG tablet Take 1 tablet (500 mg total) by mouth 2 (two) times daily as needed for muscle spasms. 09/26/14   Waynetta Pean,  PA-C  Multiple Vitamin (MULTIVITAMIN WITH MINERALS) TABS Take 1 tablet by mouth every evening.    Historical Provider, MD  naproxen (NAPROSYN) 250 MG tablet Take 1 tablet (250 mg total) by mouth 2 (two) times daily with a meal. 09/26/14   Waynetta Pean, PA-C  niacin 100 MG tablet Take 100 mg by mouth every evening.    Historical Provider, MD  OVER THE COUNTER MEDICATION Take 1 tablet by mouth 2 (two) times daily. Green Bean Coffee Diet Supplement    Historical Provider, MD  phentermine (ADIPEX-P) 37.5 MG tablet TAKE 1/2 TABLET DAILY FOR 1 WEEK THEN INCREASE TO 1 WHOLE TABLET DAILY 09/19/14   Historical Provider, MD  PredniSONE 10 MG KIT 12 day dose pack po 07/26/13   Gregor Hams, MD  traMADol (ULTRAM) 50 MG tablet Take 1 tablet (50 mg total) by mouth every 6 (six) hours as needed. 07/26/13   Gregor Hams, MD  vitamin B-12 (CYANOCOBALAMIN) 100 MCG tablet Take 100 mcg by mouth every evening.    Historical Provider, MD   BP 122/86 mmHg  Pulse 76  Temp(Src) 97.8 F (36.6 C) (Oral)  Resp 20  Wt 234 lb 6.4 oz (106.323 kg)  SpO2 100%  LMP 03/05/2011 Physical Exam  Nursing note and vitals reviewed.  49 year old female, resting comfortably and in no acute distress. Vital signs are normal. Oxygen saturation is 100%, which is normal. Head is normocephalic and atraumatic. PERRLA, EOMI. Oropharynx is clear. Neck is nontender and supple without adenopathy or JVD. Back is nontender and there is no CVA tenderness. Lungs are clear without rales, wheezes, or rhonchi. Chest is nontender. Heart has regular rate and rhythm without murmur. Abdomen is soft, flat, with mild right upper quadrant tenderness. There is no rebound or guarding. There is a positive Murphy sign. There are no masses or hepatosplenomegaly and peristalsis is hypoactive. Extremities have no cyanosis or edema, full range of motion is present. Skin is warm and dry without rash. Neurologic: Mental status is normal, cranial nerves are intact,  there are no motor or sensory deficits.  ED Course  Procedures (including critical care time) Labs Review Results for orders placed or performed during the hospital encounter of 09/29/14  Lipase, blood  Result Value Ref Range   Lipase 33 22 - 51 U/L  Comprehensive metabolic panel  Result Value Ref Range   Sodium 136 135 - 145 mmol/L   Potassium 4.3 3.5 - 5.1 mmol/L   Chloride 105 101 - 111 mmol/L   CO2 22 22 - 32 mmol/L   Glucose, Bld 92 65 - 99 mg/dL   BUN 9 6 - 20 mg/dL   Creatinine, Ser 0.73  0.44 - 1.00 mg/dL   Calcium 9.4 8.9 - 10.3 mg/dL   Total Protein 6.4 (L) 6.5 - 8.1 g/dL   Albumin 3.9 3.5 - 5.0 g/dL   AST 22 15 - 41 U/L   ALT 26 14 - 54 U/L   Alkaline Phosphatase 56 38 - 126 U/L   Total Bilirubin 0.8 0.3 - 1.2 mg/dL   GFR calc non Af Amer >60 >60 mL/min   GFR calc Af Amer >60 >60 mL/min   Anion gap 9 5 - 15  CBC  Result Value Ref Range   WBC 8.4 4.0 - 10.5 K/uL   RBC 4.64 3.87 - 5.11 MIL/uL   Hemoglobin 12.7 12.0 - 15.0 g/dL   HCT 38.3 36.0 - 46.0 %   MCV 82.5 78.0 - 100.0 fL   MCH 27.4 26.0 - 34.0 pg   MCHC 33.2 30.0 - 36.0 g/dL   RDW 13.5 11.5 - 15.5 %   Platelets 278 150 - 400 K/uL  Urinalysis, Routine w reflex microscopic (not at [facility])  Result Value Ref Range   Color, Urine YELLOW YELLOW   APPearance CLEAR CLEAR   Specific Gravity, Urine 1.014 1.005 - 1.030   pH 7.0 5.0 - 8.0   Glucose, UA NEGATIVE NEGATIVE mg/dL   Hgb urine dipstick NEGATIVE NEGATIVE   Bilirubin Urine NEGATIVE NEGATIVE   Ketones, ur NEGATIVE NEGATIVE mg/dL   Protein, ur NEGATIVE NEGATIVE mg/dL   Urobilinogen, UA 0.2 0.0 - 1.0 mg/dL   Nitrite NEGATIVE NEGATIVE   Leukocytes, UA NEGATIVE NEGATIVE   Imaging Review US Abdomen Complete  09/29/2014   CLINICAL DATA:  Diffuse abdominal pain for 1 week.  EXAM: ULTRASOUND ABDOMEN COMPLETE  COMPARISON:  CT 02/25/2012  FINDINGS: Gallbladder: No physiologically distended. Multiple shadowing gallstones. Stones in the fundus greater wall echo  shadow complex. Wall thickness at the upper limits of normal measuring 3 mm. No sonographic Murphy sign noted.  Common bile duct: Diameter: 4.5 mm, normal.  Liver: No focal lesion identified. Within normal limits in parenchymal echogenicity. Normal directional flow in the main portal vein.  IVC: No abnormality visualized.  Pancreas: Visualized portion unremarkable.  Spleen: Size and appearance within normal limits.  Right Kidney: Length: 10.9 cm. Echogenicity within normal limits. No mass or hydronephrosis visualized.  Left Kidney: Length: 11.4 cm. Echogenicity within normal limits. No mass or hydronephrosis visualized.  Abdominal aorta: No aneurysm visualized.  Other findings: None.  No ascites.  IMPRESSION: Cholelithiasis with gallbladder wall thickness at the upper limits of normal. Negative sonographic Murphy sign. No biliary dilatation. No convincing finding suspicious for acute cholecystitis, however nuclear medicine HIDA scan could be considered based on clinical concern.   Electronically Signed   By: Jeb Levering M.D.   On: 09/29/2014 05:55     MDM   Final diagnoses:  Abdominal pain, unspecified abdominal location  Calculus of gallbladder without cholecystitis without obstruction  Biliary colic    Abdominal pain with physical findings worrisome for possible biliary colic. Old records are reviewed and she had a CT of her abdomen and pelvis in December 2013 which showed cholelithiasis. She never followed up with general surgery. Laboratory workup is unremarkable including normal transaminases and WBC. She will be sent for abdominal ultrasound to nature there is no sign of cystitis present. If not present, will be referred to general surgery for evaluation for elective cholecystectomy.  Ultrasound confirms cholelithiasis without evidence of cholecystitis. She is given a prescription for oxycodone and acetaminophen for pain and referred  to [facility] surgery.  Delora Fuel, MD 45/62/56  [id number]

## 2014-09-29 NOTE — ED Notes (Signed)
Pt ambulatory at discharge, left with all belongings.

## 2014-09-29 NOTE — Discharge Instructions (Signed)
Stay on a low fat diet. Return if pain is not being adequately relieved by oxycodone-acetaminophen, or if you start running a fever. See the surgeon as soon as possible. You will be at risk for more pain like this until you have your gallbladder removed.  Cholelithiasis Cholelithiasis (also called gallstones) is a form of gallbladder disease in which gallstones form in your gallbladder. The gallbladder is an organ that stores bile made in the liver, which helps digest fats. Gallstones begin as small crystals and slowly grow into stones. Gallstone pain occurs when the gallbladder spasms and a gallstone is blocking the duct. Pain can also occur when a stone passes out of the duct.  RISK FACTORS  Being female.   Having multiple pregnancies. Health care providers sometimes advise removing diseased gallbladders before future pregnancies.   Being obese.  Eating a diet heavy in fried foods and fat.   Being older than 50 years and increasing age.   Prolonged use of medicines containing female hormones.   Having diabetes mellitus.   Rapidly losing weight.   Having a family history of gallstones (heredity).  SYMPTOMS  Nausea.   Vomiting.  Abdominal pain.   Yellowing of the skin (jaundice).   Sudden pain. It may persist from several minutes to several hours.  Fever.   Tenderness to the touch. In some cases, when gallstones do not move into the bile duct, people have no pain or symptoms. These are called "silent" gallstones.  TREATMENT Silent gallstones do not need treatment. In severe cases, emergency surgery may be required. Options for treatment include:  Surgery to remove the gallbladder. This is the most common treatment.  Medicines. These do not always work and may take 6-12 months or more to work.  Shock wave treatment (extracorporeal biliary lithotripsy). In this treatment an ultrasound machine sends shock waves to the gallbladder to break gallstones into  smaller pieces that can pass into the intestines or be dissolved by medicine. HOME CARE INSTRUCTIONS   Only take over-the-counter or prescription medicines for pain, discomfort, or fever as directed by your health care provider.   Follow a low-fat diet until seen again by your health care provider. Fat causes the gallbladder to contract, which can result in pain.   Follow up with your health care provider as directed. Attacks are almost always recurrent and surgery is usually required for permanent treatment.  SEEK IMMEDIATE MEDICAL CARE IF:   Your pain increases and is not controlled by medicines.   You have a fever or persistent symptoms for more than 2-3 days.   You have a fever and your symptoms suddenly get worse.   You have persistent nausea and vomiting.  MAKE SURE YOU:   Understand these instructions.  Will watch your condition.  Will get help right away if you are not doing well or get worse. Document Released: 02/14/2005 Document Revised: 10/21/2012 Document Reviewed: 08/12/2012 [facility] Patient Information 2015 [place], [place]. This information is not intended to replace advice given to you by your health care provider. Make sure you discuss any questions you have with your health care provider.  Acetaminophen; Oxycodone tablets What is this medicine? ACETAMINOPHEN; OXYCODONE (a set a MEE noe fen; ox i KOE done) is a pain reliever. It is used to treat mild to moderate pain. This medicine may be used for other purposes; ask your health care provider or pharmacist if you have questions. COMMON BRAND NAME(S): Endocet, Magnacet, Narvox, Percocet, Perloxx, Primalev, Primlev, Roxicet, Xolox What should I  tell my health care provider before I take this medicine? They need to know if you have any of these conditions: -brain tumor -Crohn's disease, inflammatory bowel disease, or ulcerative colitis -drug abuse or addiction -head injury -heart or circulation problems -if  you often drink alcohol -kidney disease or problems going to the bathroom -liver disease -lung disease, asthma, or breathing problems -an unusual or allergic reaction to acetaminophen, oxycodone, other opioid analgesics, other medicines, foods, dyes, or preservatives -pregnant or trying to get pregnant -breast-feeding How should I use this medicine? Take this medicine by mouth with a full glass of water. Follow the directions on the prescription label. Take your medicine at regular intervals. Do not take your medicine more often than directed. Talk to your pediatrician regarding the use of this medicine in children. Special care may be needed. Patients over 74 years old may have a stronger reaction and need a smaller dose. Overdosage: If you think you have taken too much of this medicine contact a poison control center or emergency room at once. NOTE: This medicine is only for you. Do not share this medicine with others. What if I miss a dose? If you miss a dose, take it as soon as you can. If it is almost time for your next dose, take only that dose. Do not take double or extra doses. What may interact with this medicine? -alcohol -antihistamines -barbiturates like amobarbital, butalbital, butabarbital, methohexital, pentobarbital, phenobarbital, thiopental, and secobarbital -benztropine -drugs for bladder problems like solifenacin, trospium, oxybutynin, tolterodine, hyoscyamine, and methscopolamine -drugs for breathing problems like ipratropium and tiotropium -drugs for certain stomach or intestine problems like propantheline, homatropine methylbromide, glycopyrrolate, atropine, belladonna, and dicyclomine -general anesthetics like etomidate, ketamine, nitrous oxide, propofol, desflurane, enflurane, halothane, isoflurane, and sevoflurane -medicines for depression, anxiety, or psychotic disturbances -medicines for sleep -muscle relaxants -naltrexone -narcotic medicines (opiates) for  pain -phenothiazines like perphenazine, thioridazine, chlorpromazine, mesoridazine, fluphenazine, prochlorperazine, promazine, and trifluoperazine -scopolamine -tramadol -trihexyphenidyl This list may not describe all possible interactions. Give your health care provider a list of all the medicines, herbs, non-prescription drugs, or dietary supplements you use. Also tell them if you smoke, drink alcohol, or use illegal drugs. Some items may interact with your medicine. What should I watch for while using this medicine? Tell your doctor or health care professional if your pain does not go away, if it gets worse, or if you have new or a different type of pain. You may develop tolerance to the medicine. Tolerance means that you will need a higher dose of the medication for pain relief. Tolerance is normal and is expected if you take this medicine for a long time. Do not suddenly stop taking your medicine because you may develop a severe reaction. Your body becomes used to the medicine. This does NOT mean you are addicted. Addiction is a behavior related to getting and using a drug for a non-medical reason. If you have pain, you have a medical reason to take pain medicine. Your doctor will tell you how much medicine to take. If your doctor wants you to stop the medicine, the dose will be slowly lowered over time to avoid any side effects. You may get drowsy or dizzy. Do not drive, use machinery, or do anything that needs mental alertness until you know how this medicine affects you. Do not stand or sit up quickly, especially if you are an older patient. This reduces the risk of dizzy or fainting spells. Alcohol may interfere with the effect of  this medicine. Avoid alcoholic drinks. There are different types of narcotic medicines (opiates) for pain. If you take more than one type at the same time, you may have more side effects. Give your health care provider a list of all medicines you use. Your doctor will  tell you how much medicine to take. Do not take more medicine than directed. Call emergency for help if you have problems breathing. The medicine will cause constipation. Try to have a bowel movement at least every 2 to 3 days. If you do not have a bowel movement for 3 days, call your doctor or health care professional. Do not take Tylenol (acetaminophen) or medicines that have acetaminophen with this medicine. Too much acetaminophen can be very dangerous. Many nonprescription medicines contain acetaminophen. Always read the labels carefully to avoid taking more acetaminophen. What side effects may I notice from receiving this medicine? Side effects that you should report to your doctor or health care professional as soon as possible: -allergic reactions like skin rash, itching or hives, swelling of the face, lips, or tongue -breathing difficulties, wheezing -confusion -light headedness or fainting spells -severe stomach pain -unusually weak or tired -yellowing of the skin or the whites of the eyes Side effects that usually do not require medical attention (report to your doctor or health care professional if they continue or are bothersome): -dizziness -drowsiness -nausea -vomiting This list may not describe all possible side effects. Call your doctor for medical advice about side effects. You may report side effects to FDA at 1-800-FDA-1088. Where should I keep my medicine? Keep out of the reach of children. This medicine can be abused. Keep your medicine in a safe place to protect it from theft. Do not share this medicine with anyone. Selling or giving away this medicine is dangerous and against the law. Store at room temperature between 20 and 25 degrees C (68 and 77 degrees F). Keep container tightly closed. Protect from light. This medicine may cause accidental overdose and death if it is taken by other adults, children, or pets. Flush any unused medicine down the toilet to reduce the  chance of harm. Do not use the medicine after the expiration date. NOTE: This sheet is a summary. It may not cover all possible information. If you have questions about this medicine, talk to your doctor, pharmacist, or health care provider.  2015, [employer]. (2012-10-12 13:17:35)

## 2014-09-29 NOTE — ED Notes (Signed)
C/o mid abd pain that radiates to back x 1 week.  Denies nausea, vomiting, diarrhea, constipation, and urinary complaints.

## 2015-01-21 ENCOUNTER — Emergency Department (INDEPENDENT_AMBULATORY_CARE_PROVIDER_SITE_OTHER)
Admission: EM | Admit: 2015-01-21 | Discharge: 2015-01-21 | Disposition: A | Discharge status: Home / Self Care | Payer: No Typology Code available for payment source | Source: Home / Self Care | Attending: Emergency Medicine | Admitting: Emergency Medicine

## 2015-01-21 ENCOUNTER — Encounter (HOSPITAL_COMMUNITY): Payer: Self-pay | Admitting: Emergency Medicine

## 2015-01-21 DIAGNOSIS — K047 Periapical abscess without sinus: Secondary | ICD-10-CM

## 2015-01-21 MED ORDER — HYDROCODONE-ACETAMINOPHEN 5-325 MG PO TABS: 1.0000 | ORAL_TABLET | Freq: Four times a day (QID) | ORAL | Status: DC | PRN

## 2015-01-21 MED ORDER — AMOXICILLIN 500 MG PO CAPS: 500.0000 mg | ORAL_CAPSULE | Freq: Two times a day (BID) | ORAL | Status: DC

## 2015-01-21 MED ORDER — LIDOCAINE HCL 2 % IJ SOLN
INTRAMUSCULAR | Status: AC
  Filled 2015-01-21: qty 20

## 2015-01-21 MED ORDER — IBUPROFEN 600 MG PO TABS: 600.0000 mg | ORAL_TABLET | Freq: Four times a day (QID) | ORAL | Status: DC | PRN

## 2015-01-21 NOTE — ED Notes (Signed)
The patient presented to the [facility] with a complaint of dental pain that has been ongoing for 1 month but he pain increased last night.

## 2015-01-21 NOTE — ED Provider Notes (Signed)
CSN: [id number]     Arrival date & time 01/21/15  1455 History   First MD Initiated Contact with Patient 01/21/15 1524     Chief Complaint  Patient presents with  . Dental Pain   (Consider location/radiation/quality/duration/timing/severity/associated sxs/prior Treatment) HPI  She is a 49 year old woman here for evaluation of dental pain. She reports some chronic dental pain, but it acutely worsened last night. She reports pain in the left lower molar. The pain radiates throughout the left side of her face and down into her left arm.  She states the pain will make her arm feel numb. No weakness. No fevers or chills.  Past Medical History  Diagnosis Date  . 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 No pertinent past medical history   . Obesity   . Fibroids   . Gall stones   . Hypertension    Past Surgical History  Procedure Laterality Date  . Bilateral tubal ligation  1996  . Svd      x 3  . Abdominal hysterectomy  03/11/2011    Procedure: HYSTERECTOMY ABDOMINAL;  Surgeon: Juliene Pina C. Hulan Fray, MD;  Location: [facility];  Service: Gynecology;  Laterality: N/A;  Possible removal of tubes & ovaries  . Salpingoophorectomy  03/11/2011    Procedure: SALPINGO OOPHERECTOMY;  Surgeon: Myra C. Hulan Fray, MD;  Location: [facility];  Service: Gynecology;  Laterality: Bilateral;   Family History  Problem Relation Age of Onset  . Diabetes type II Mother     Not currently on insulin but had been at one time  . Hypertension Mother   . Hyperlipidemia Mother   . Hypertension Father   . Hyperlipidemia Father   . Prostate cancer Father 36    s/p radiation  . Stroke Father 34  . Coronary artery disease Mother     s/p stents  . Fibroids Sister     s/p fibroidectomy 2/2 dub   Social History  Substance Use Topics  . Smoking status: Former Smoker -- 0.30 packs/day for 35 years    Types: Cigarettes    Quit date: 06/06/2010  . Smokeless tobacco: Never Used  . Alcohol Use: No   OB History    Gravida Para Term Preterm AB TAB SAB Ectopic Multiple Living   4 3 3  0 1  1        Review of Systems As in history of present illness Allergies  Review of patient's allergies indicates no known allergies.  Home Medications   Prior to Admission medications   Medication Sig Start Date End Date Taking? Authorizing Provider  acidophilus (RISAQUAD) CAPS Take 1 capsule by mouth daily.    Historical Provider, MD  amoxicillin (AMOXIL) 500 MG capsule Take 1 capsule (500 mg total) by mouth 2 (two) times daily. 01/21/15   Melony Overly, MD  Ascorbic Acid (VITAMIN C PO) Take 1 tablet by mouth daily.    Historical Provider, MD  Black Cohosh 160 MG CAPS Take 1 capsule by mouth daily.    Historical Provider, MD  Calcium Carbonate-Vitamin D (CALTRATE 600+D PO) Take 600 mg by mouth daily.    Historical Provider, MD  esomeprazole (NEXIUM) 40 MG capsule Take 40 mg by mouth daily before breakfast.    Historical Provider, MD  HYDROcodone-acetaminophen (NORCO) 5-325 MG tablet Take 1 tablet by mouth every 6 (six) hours as needed for moderate pain. 01/21/15   Melony Overly, MD  ibuprofen (ADVIL,MOTRIN) 600 MG tablet Take 1 tablet (600  mg total) by mouth every 6 (six) hours as needed for moderate pain. 01/21/15   Melony Overly, MD  lisinopril-hydrochlorothiazide (PRINZIDE,ZESTORETIC) 10-12.5 MG per tablet Take 1 tablet by mouth daily. 09/19/14   Historical Provider, MD  Multiple Vitamin (MULTIVITAMIN WITH MINERALS) TABS Take 1 tablet by mouth every evening.    Historical Provider, MD  niacin 100 MG tablet Take 100 mg by mouth every evening.    Historical Provider, MD  oxyCODONE-acetaminophen (PERCOCET) 5-325 MG per tablet Take 1 tablet by mouth every 4 (four) hours as needed for moderate pain. 123XX123   Delora Fuel, MD  phentermine (ADIPEX-P) 37.5 MG tablet TAKE 1/2 TABLET DAILY FOR 1 WEEK THEN INCREASE TO 1 WHOLE TABLET DAILY 09/19/14   Historical Provider, MD  vitamin B-12 (CYANOCOBALAMIN) 100 MCG tablet Take 100 mcg  by mouth every evening.    Historical Provider, MD   Meds Ordered and Administered this Visit  Medications - No data to display  BP 132/88 mmHg  Pulse 66  Temp(Src) 98.2 F (36.8 C) (Oral)  Resp 18  SpO2 100%  LMP 03/05/2011 No data found.   Physical Exam  Constitutional: She is oriented to person, place, and time. She appears well-developed and well-nourished. No distress.  HENT:  Mouth/Throat: Dental caries present.    Neck: Neck supple.  Cardiovascular: Normal rate.   Pulmonary/Chest: Effort normal.  Musculoskeletal:  Bilateral upper extremities have intact strength. Sensation grossly intact.  Neurological: She is alert and oriented to person, place, and time.    ED Course  Procedures (including critical care time)  Labs Review Labs Reviewed - No data to display  Imaging Review No results found.     MDM   1. Dental infection    Approximately 1 mL of 2% lidocaine was injected around the involved tooth for pain relief. Prescriptions for amoxicillin, ibuprofen, and Norco given. It is somewhat unusual to have pain radiating all the way into the left arm, but I see no signs of nerve or neck involvement. Follow-up with a dentist. Handout with low cost dental resources given.    Melony Overly, MD 01/21/15 [phone]

## 2015-01-21 NOTE — Discharge Instructions (Signed)
It looks like your tooth is getting infected. The numbing medicine will wear off in 4 hours. Take amoxicillin twice a day for the next 10 days. Use ibuprofen and Norco as needed for pain. I have given you a list of community dental resources. Please see a dentist as soon as possible.

## 2015-05-01 ENCOUNTER — Encounter (HOSPITAL_COMMUNITY): Payer: Self-pay | Admitting: Emergency Medicine

## 2015-05-01 ENCOUNTER — Observation Stay (HOSPITAL_COMMUNITY)
Admission: EM | Admit: 2015-05-01 | Discharge: 2015-05-03 | Disposition: A | Discharge status: Home / Self Care | Payer: BLUE CROSS/BLUE SHIELD | Attending: General Surgery | Admitting: General Surgery

## 2015-05-01 DIAGNOSIS — Z87891 Personal history of nicotine dependence: Secondary | ICD-10-CM | POA: Insufficient documentation

## 2015-05-01 DIAGNOSIS — Z6832 Body mass index (BMI) 32.0-32.9, adult: Secondary | ICD-10-CM | POA: Insufficient documentation

## 2015-05-01 DIAGNOSIS — K810 Acute cholecystitis: Secondary | ICD-10-CM

## 2015-05-01 DIAGNOSIS — E669 Obesity, unspecified: Secondary | ICD-10-CM | POA: Diagnosis not present

## 2015-05-01 DIAGNOSIS — I10 Essential (primary) hypertension: Secondary | ICD-10-CM | POA: Insufficient documentation

## 2015-05-01 DIAGNOSIS — K8000 Calculus of gallbladder with acute cholecystitis without obstruction: Principal | ICD-10-CM | POA: Insufficient documentation

## 2015-05-01 DIAGNOSIS — Z90710 Acquired absence of both cervix and uterus: Secondary | ICD-10-CM | POA: Insufficient documentation

## 2015-05-01 DIAGNOSIS — R1013 Epigastric pain: Secondary | ICD-10-CM | POA: Diagnosis present

## 2015-05-01 DIAGNOSIS — R109 Unspecified abdominal pain: Secondary | ICD-10-CM

## 2015-05-01 LAB — COMPREHENSIVE METABOLIC PANEL
ALT: 24 U/L (ref 14–54)
ANION GAP: 13 (ref 5–15)
AST: 22 U/L (ref 15–41)
Albumin: 4.1 g/dL (ref 3.5–5.0)
Alkaline Phosphatase: 54 U/L (ref 38–126)
BUN: 10 mg/dL (ref 6–20)
CO2: 26 mmol/L (ref 22–32)
CREATININE: 0.74 mg/dL (ref 0.44–1.00)
Calcium: 9.6 mg/dL (ref 8.9–10.3)
Chloride: 102 mmol/L (ref 101–111)
Glucose, Bld: 124 mg/dL — ABNORMAL HIGH (ref 65–99)
Potassium: 3.9 mmol/L (ref 3.5–5.1)
SODIUM: 141 mmol/L (ref 135–145)
Total Bilirubin: 0.7 mg/dL (ref 0.3–1.2)
Total Protein: 7.2 g/dL (ref 6.5–8.1)

## 2015-05-01 LAB — URINALYSIS, ROUTINE W REFLEX MICROSCOPIC
Bilirubin Urine: NEGATIVE
Glucose, UA: NEGATIVE mg/dL
Hgb urine dipstick: NEGATIVE
KETONES UR: NEGATIVE mg/dL
LEUKOCYTES UA: NEGATIVE
NITRITE: NEGATIVE
PROTEIN: NEGATIVE mg/dL
Specific Gravity, Urine: 1.018 (ref 1.005–1.030)
pH: 7.5 (ref 5.0–8.0)

## 2015-05-01 LAB — LIPASE, BLOOD: LIPASE: 24 U/L (ref 11–51)

## 2015-05-01 LAB — CBC
HCT: 37 % (ref 36.0–46.0)
HEMOGLOBIN: 12.1 g/dL (ref 12.0–15.0)
MCH: 27 pg (ref 26.0–34.0)
MCHC: 32.7 g/dL (ref 30.0–36.0)
MCV: 82.6 fL (ref 78.0–100.0)
PLATELETS: 288 10*3/uL (ref 150–400)
RBC: 4.48 MIL/uL (ref 3.87–5.11)
RDW: 13.4 % (ref 11.5–15.5)
WBC: 11.9 10*3/uL — AB (ref 4.0–10.5)

## 2015-05-01 MED ORDER — OXYCODONE-ACETAMINOPHEN 5-325 MG PO TABS
ORAL_TABLET | ORAL | Status: AC
  Filled 2015-05-01: qty 1

## 2015-05-01 MED ORDER — OXYCODONE-ACETAMINOPHEN 5-325 MG PO TABS
1.0000 | ORAL_TABLET | Freq: Once | ORAL | Status: AC
  Administered 2015-05-01: 1 via ORAL

## 2015-05-01 MED ORDER — ONDANSETRON 4 MG PO TBDP
4.0000 mg | ORAL_TABLET | Freq: Once | ORAL | Status: AC | PRN
  Administered 2015-05-01: 4 mg via ORAL

## 2015-05-01 MED ORDER — ONDANSETRON 4 MG PO TBDP
ORAL_TABLET | ORAL | Status: AC
  Filled 2015-05-01: qty 1

## 2015-05-01 NOTE — ED Notes (Signed)
Patient here with complaint of diffuse abdominal pain. States was seen several months ago and told she had gallstones, but was not surgical at that time. Onset of pain was this AM at 0400. Endorses vomiting today and constipation x 1 week.

## 2015-05-01 NOTE — ED Provider Notes (Signed)
CSN: [id number]     Arrival date & time 05/01/15  1952 History  By signing my name below, I, Kimberly Anderson, attest that this documentation has been prepared under the direction and in the presence of Kimberly Biles, MD. Electronically Signed: Helane Anderson, ED Scribe. 05/02/2015. 1:06 AM.    Chief Complaint  Patient presents with  . Abdominal Pain   The history is provided by the patient. No language interpreter was used.   HPI Comments: Kimberly Anderson is a 50 y.o. female former smoker with a PMHx of drug abuse, gall stones, fibroids, and HTN who presents to the Emergency Department complaining of right-sided abdominal pain radiating to the back onset this morning at 4 AM. She notes her pain improved somewhat after being given medication upon arrival in the ED, but since that has begun to wear of, she currently rates her pain as an 8.5/10. She reports associated nausea, 4 episodes of vomiting, and passing gas. She notes she last ate 1 hour ago and only had about 3 bites of a subway sandwich due to loss of appetite. She states she had some collard greens and other vegetables last night. She was seen for the same a few months ago, when she was diagnosed with gall stones. She has seen someone for this, but cannot afford to pay for a cholecystectomy. She notes a PSHx of fibroid surgery and hysterectomy. She denies dysuria.   Past Medical History  Diagnosis Date  . 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 No pertinent past medical history   . Obesity   . Fibroids   . Gall stones   . Hypertension    Past Surgical History  Procedure Laterality Date  . Bilateral tubal ligation  1996  . Svd      x 3  . Abdominal hysterectomy  03/11/2011    Procedure: HYSTERECTOMY ABDOMINAL;  Surgeon: Kimberly Pina C. Hulan Fray, MD;  Location: [facility];  Service: Gynecology;  Laterality: N/A;  Possible removal of tubes & ovaries  . Salpingoophorectomy  03/11/2011    Procedure: SALPINGO OOPHERECTOMY;   Surgeon: Kimberly C. Hulan Fray, MD;  Location: [facility];  Service: Gynecology;  Laterality: Bilateral;   Family History  Problem Relation Age of Onset  . Diabetes type II Mother     Not currently on insulin but had been at one time  . Hypertension Mother   . Hyperlipidemia Mother   . Hypertension Father   . Hyperlipidemia Father   . Prostate cancer Father 73    s/p radiation  . Stroke Father 33  . Coronary artery disease Mother     s/p stents  . Fibroids Sister     s/p fibroidectomy 2/2 dub   Social History  Substance Use Topics  . Smoking status: Former Smoker -- 0.30 packs/day for 35 years    Types: Cigarettes    Quit date: 06/06/2010  . Smokeless tobacco: Never Used  . Alcohol Use: No   OB History    Gravida Para Term Preterm AB TAB SAB Ectopic Multiple Living   4 3 3  0 1  1        Review of Systems  Constitutional: Positive for appetite change.  Gastrointestinal: Positive for abdominal pain. Negative for nausea and vomiting.  Genitourinary: Negative for dysuria.    Allergies  Review of patient's allergies indicates no known allergies.  Home Medications   Prior to Admission medications   Medication Sig Start Date End Date Taking? Authorizing  Provider  acidophilus (RISAQUAD) CAPS Take 1 capsule by mouth daily.   Yes Historical Provider, MD  Ascorbic Acid (VITAMIN C PO) Take 1 tablet by mouth daily.   Yes Historical Provider, MD  Black Cohosh 160 MG CAPS Take 1 capsule by mouth daily.   Yes Historical Provider, MD  Calcium Carbonate-Vitamin D (CALTRATE 600+D PO) Take 600 mg by mouth daily.   Yes Historical Provider, MD  HYDROcodone-acetaminophen (NORCO) 5-325 MG tablet Take 1 tablet by mouth every 6 (six) hours as needed for moderate pain. 01/21/15  Yes Melony Overly, MD  ibuprofen (ADVIL,MOTRIN) 600 MG tablet Take 1 tablet (600 mg total) by mouth every 6 (six) hours as needed for moderate pain. 01/21/15  Yes Melony Overly, MD  lisinopril-hydrochlorothiazide  (PRINZIDE,ZESTORETIC) 10-12.5 MG per tablet Take 1 tablet by mouth daily. 09/19/14  Yes Historical Provider, MD  Multiple Vitamin (MULTIVITAMIN WITH MINERALS) TABS Take 1 tablet by mouth every evening.   Yes Historical Provider, MD  niacin 100 MG tablet Take 100 mg by mouth every evening.   Yes Historical Provider, MD  oxyCODONE-acetaminophen (PERCOCET) 5-325 MG per tablet Take 1 tablet by mouth every 4 (four) hours as needed for moderate pain. 123XX123  Yes Kimberly Fuel, MD  phentermine (ADIPEX-P) 37.5 MG tablet Take 37.5 mg by mouth daily before breakfast.   Yes Historical Provider, MD  vitamin B-12 (CYANOCOBALAMIN) 100 MCG tablet Take 100 mcg by mouth every evening.   Yes Historical Provider, MD  amoxicillin (AMOXIL) 500 MG capsule Take 1 capsule (500 mg total) by mouth 2 (two) times daily. Patient not taking: Reported on 05/02/2015 01/21/15   Melony Overly, MD   BP 112/74 mmHg  Pulse 73  Temp(Src) 98.3 F (36.8 C) (Oral)  Resp 14  SpO2 100%  LMP 03/05/2011 Physical Exam  Constitutional: She is oriented to person, place, and time. She appears well-developed and well-nourished.  HENT:  Head: Normocephalic and atraumatic.  Eyes: Conjunctivae are normal. Right eye exhibits no discharge. Left eye exhibits no discharge.  Cardiovascular: Normal rate and regular rhythm.   Murmur (systolic) heard. Pulmonary/Chest: Effort normal and breath sounds normal. No respiratory distress.  Lungs are CTA  Abdominal: Soft. Bowel sounds are normal. There is tenderness (epigastric and RUQ).  Negative Murphy's sign  Neurological: She is alert and oriented to person, place, and time. Coordination normal.  Skin: Skin is warm and dry. No rash noted. She is not diaphoretic. No erythema.  Psychiatric: She has a normal mood and affect.  Nursing note and vitals reviewed.   ED Course  Procedures  DIAGNOSTIC STUDIES: Oxygen Saturation is 100% on RA, normal by my interpretation.    COORDINATION OF CARE: 12:25 AM  - Discussed urinalysis results and plans to order an [place], as well as medication for nausea and pain. Pt advised of plan for treatment and pt agrees.  Labs Review Labs Reviewed  COMPREHENSIVE METABOLIC PANEL - Abnormal; Notable for the following:    Glucose, Bld 124 (*)    All other components within normal limits  CBC - Abnormal; Notable for the following:    WBC 11.9 (*)    All other components within normal limits  URINALYSIS, ROUTINE W REFLEX MICROSCOPIC (NOT AT [facility]) - Abnormal; Notable for the following:    APPearance CLOUDY (*)    All other components within normal limits  LIPASE, BLOOD    Imaging Review No results found. I have personally reviewed and evaluated these images and lab results as part of  my medical decision-making.   EKG Interpretation None      MDM   Final diagnoses:  Abdominal pain    I personally performed the services described in this documentation, which was scribed in my presence. The recorded information has been reviewed and is accurate.   Pt comes in with abd pain, and clinical concerns for for symptomatic cholelithiasis given the hx and the exam and known hx of gall stones from July.  Will repeat US, but will need Surgery consultation if pain persists.   Kimberly Biles, MD 05/02/15 [phone]

## 2015-05-02 ENCOUNTER — Observation Stay (HOSPITAL_COMMUNITY): Payer: BLUE CROSS/BLUE SHIELD | Admitting: Anesthesiology

## 2015-05-02 ENCOUNTER — Emergency Department (HOSPITAL_COMMUNITY): Payer: BLUE CROSS/BLUE SHIELD

## 2015-05-02 ENCOUNTER — Encounter (HOSPITAL_COMMUNITY)
Admission: EM | Disposition: A | Discharge status: Home / Self Care | Payer: Self-pay | Source: Home / Self Care | Attending: Emergency Medicine

## 2015-05-02 DIAGNOSIS — K810 Acute cholecystitis: Secondary | ICD-10-CM | POA: Diagnosis present

## 2015-05-02 HISTORY — PX: CHOLECYSTECTOMY: SHX55

## 2015-05-02 LAB — SURGICAL PCR SCREEN
MRSA, PCR: NEGATIVE
STAPHYLOCOCCUS AUREUS: NEGATIVE

## 2015-05-02 SURGERY — LAPAROSCOPIC CHOLECYSTECTOMY
Anesthesia: General | Site: Abdomen

## 2015-05-02 MED ORDER — MIDAZOLAM HCL 5 MG/5ML IJ SOLN
INTRAMUSCULAR | Status: DC | PRN
  Administered 2015-05-02: 2 mg via INTRAVENOUS

## 2015-05-02 MED ORDER — PROPOFOL 10 MG/ML IV BOLUS
INTRAVENOUS | Status: AC
  Filled 2015-05-02: qty 20

## 2015-05-02 MED ORDER — ENOXAPARIN SODIUM 40 MG/0.4ML ~~LOC~~ SOLN
40.0000 mg | SUBCUTANEOUS | Status: DC
  Administered 2015-05-03: 40 mg via SUBCUTANEOUS
  Filled 2015-05-02: qty 0.4

## 2015-05-02 MED ORDER — 0.9 % SODIUM CHLORIDE (POUR BTL) OPTIME
TOPICAL | Status: DC | PRN
  Administered 2015-05-02: 1000 mL

## 2015-05-02 MED ORDER — HYDROMORPHONE HCL 1 MG/ML IJ SOLN
0.2500 mg | INTRAMUSCULAR | Status: DC | PRN
  Administered 2015-05-02 (×2): 0.5 mg via INTRAVENOUS

## 2015-05-02 MED ORDER — LISINOPRIL-HYDROCHLOROTHIAZIDE 10-12.5 MG PO TABS: 1.0000 | ORAL_TABLET | Freq: Every day | ORAL | Status: DC

## 2015-05-02 MED ORDER — PROPOFOL 10 MG/ML IV BOLUS
INTRAVENOUS | Status: DC | PRN
  Administered 2015-05-02: 150 mg via INTRAVENOUS

## 2015-05-02 MED ORDER — DEXTROSE 5 % IV SOLN
2.0000 g | INTRAVENOUS | Status: DC
  Administered 2015-05-02: 2 g via INTRAVENOUS
  Filled 2015-05-02 (×2): qty 2

## 2015-05-02 MED ORDER — ONDANSETRON HCL 4 MG/2ML IJ SOLN
4.0000 mg | Freq: Once | INTRAMUSCULAR | Status: AC
  Administered 2015-05-02: 4 mg via INTRAVENOUS
  Filled 2015-05-02: qty 2

## 2015-05-02 MED ORDER — DEXTROSE 5 % IV SOLN
1.0000 g | Freq: Once | INTRAVENOUS | Status: AC
  Administered 2015-05-02: 1 g via INTRAVENOUS
  Filled 2015-05-02: qty 10

## 2015-05-02 MED ORDER — KCL IN DEXTROSE-NACL 20-5-0.45 MEQ/L-%-% IV SOLN
INTRAVENOUS | Status: DC
  Administered 2015-05-02: 06:00:00 via INTRAVENOUS
  Filled 2015-05-02 (×2): qty 1000

## 2015-05-02 MED ORDER — ONDANSETRON 4 MG PO TBDP: 4.0000 mg | ORAL_TABLET | Freq: Four times a day (QID) | ORAL | Status: DC | PRN

## 2015-05-02 MED ORDER — BUPIVACAINE HCL (PF) 0.25 % IJ SOLN
INTRAMUSCULAR | Status: AC
  Filled 2015-05-02: qty 30

## 2015-05-02 MED ORDER — NEOSTIGMINE METHYLSULFATE 10 MG/10ML IV SOLN
INTRAVENOUS | Status: DC | PRN
  Administered 2015-05-02: 5 mg via INTRAVENOUS

## 2015-05-02 MED ORDER — MORPHINE SULFATE (PF) 4 MG/ML IV SOLN: 4.0000 mg | Freq: Once | INTRAVENOUS | Status: DC

## 2015-05-02 MED ORDER — ONDANSETRON HCL 4 MG/2ML IJ SOLN
INTRAMUSCULAR | Status: DC | PRN
  Administered 2015-05-02: 4 mg via INTRAVENOUS

## 2015-05-02 MED ORDER — INFLUENZA VAC SPLIT QUAD 0.5 ML IM SUSY: 0.5000 mL | PREFILLED_SYRINGE | INTRAMUSCULAR | Status: DC | PRN

## 2015-05-02 MED ORDER — LISINOPRIL 10 MG PO TABS
10.0000 mg | ORAL_TABLET | Freq: Every day | ORAL | Status: DC
  Administered 2015-05-03: 10 mg via ORAL
  Filled 2015-05-02 (×2): qty 1

## 2015-05-02 MED ORDER — LACTATED RINGERS IV SOLN
INTRAVENOUS | Status: DC
  Administered 2015-05-02 (×2): via INTRAVENOUS

## 2015-05-02 MED ORDER — HYDROCHLOROTHIAZIDE 12.5 MG PO CAPS
12.5000 mg | ORAL_CAPSULE | Freq: Every day | ORAL | Status: DC
  Administered 2015-05-03: 12.5 mg via ORAL
  Filled 2015-05-02 (×2): qty 1

## 2015-05-02 MED ORDER — GLYCOPYRROLATE 0.2 MG/ML IJ SOLN
INTRAMUSCULAR | Status: DC | PRN
  Administered 2015-05-02: .8 mg via INTRAVENOUS

## 2015-05-02 MED ORDER — MORPHINE SULFATE (PF) 4 MG/ML IV SOLN
4.0000 mg | Freq: Once | INTRAVENOUS | Status: AC
  Administered 2015-05-02: 4 mg via INTRAVENOUS
  Filled 2015-05-02: qty 1

## 2015-05-02 MED ORDER — WHITE PETROLATUM GEL
Status: AC
  Administered 2015-05-02: 1
  Filled 2015-05-02: qty 1

## 2015-05-02 MED ORDER — VITAMIN B-12 100 MCG PO TABS
100.0000 ug | ORAL_TABLET | Freq: Every evening | ORAL | Status: DC
  Filled 2015-05-02 (×2): qty 1

## 2015-05-02 MED ORDER — HYDRALAZINE HCL 20 MG/ML IJ SOLN: 10.0000 mg | INTRAMUSCULAR | Status: DC | PRN

## 2015-05-02 MED ORDER — ROCURONIUM BROMIDE 100 MG/10ML IV SOLN
INTRAVENOUS | Status: DC | PRN
  Administered 2015-05-02: 40 mg via INTRAVENOUS

## 2015-05-02 MED ORDER — FENTANYL CITRATE (PF) 100 MCG/2ML IJ SOLN
INTRAMUSCULAR | Status: DC | PRN
  Administered 2015-05-02: 100 ug via INTRAVENOUS
  Administered 2015-05-02: 150 ug via INTRAVENOUS

## 2015-05-02 MED ORDER — NIACIN 100 MG PO TABS
100.0000 mg | ORAL_TABLET | Freq: Every evening | ORAL | Status: DC
  Filled 2015-05-02 (×2): qty 1

## 2015-05-02 MED ORDER — SODIUM CHLORIDE 0.9 % IR SOLN
Status: DC | PRN
  Administered 2015-05-02: 1000 mL

## 2015-05-02 MED ORDER — BUPIVACAINE HCL 0.25 % IJ SOLN
INTRAMUSCULAR | Status: DC | PRN
  Administered 2015-05-02: 7 mL

## 2015-05-02 MED ORDER — HYDROMORPHONE HCL 1 MG/ML IJ SOLN
0.5000 mg | INTRAMUSCULAR | Status: DC | PRN
  Administered 2015-05-02 – 2015-05-03 (×5): 1 mg via INTRAVENOUS
  Filled 2015-05-02 (×5): qty 1

## 2015-05-02 MED ORDER — FENTANYL CITRATE (PF) 250 MCG/5ML IJ SOLN
INTRAMUSCULAR | Status: AC
  Filled 2015-05-02: qty 5

## 2015-05-02 MED ORDER — PANTOPRAZOLE SODIUM 40 MG IV SOLR
40.0000 mg | Freq: Every day | INTRAVENOUS | Status: DC
  Administered 2015-05-02: 40 mg via INTRAVENOUS
  Filled 2015-05-02: qty 40

## 2015-05-02 MED ORDER — SCOPOLAMINE 1 MG/3DAYS TD PT72
MEDICATED_PATCH | TRANSDERMAL | Status: AC
  Filled 2015-05-02: qty 1

## 2015-05-02 MED ORDER — ONDANSETRON HCL 4 MG/2ML IJ SOLN
4.0000 mg | Freq: Four times a day (QID) | INTRAMUSCULAR | Status: DC | PRN
  Administered 2015-05-02 (×2): 4 mg via INTRAVENOUS
  Filled 2015-05-02 (×2): qty 2

## 2015-05-02 MED ORDER — HYDROMORPHONE HCL 1 MG/ML IJ SOLN
INTRAMUSCULAR | Status: AC
  Filled 2015-05-02: qty 2

## 2015-05-02 MED ORDER — MIDAZOLAM HCL 2 MG/2ML IJ SOLN
INTRAMUSCULAR | Status: AC
  Filled 2015-05-02: qty 2

## 2015-05-02 MED ORDER — ONDANSETRON HCL 4 MG/2ML IJ SOLN: 4.0000 mg | Freq: Four times a day (QID) | INTRAMUSCULAR | Status: DC | PRN

## 2015-05-02 MED ORDER — SCOPOLAMINE 1 MG/3DAYS TD PT72: 1.0000 | MEDICATED_PATCH | Freq: Once | TRANSDERMAL | Status: DC

## 2015-05-02 MED ORDER — DEXTROSE 5 % IN LACTATED RINGERS IV BOLUS
1000.0000 mL | Freq: Once | INTRAVENOUS | Status: AC
  Administered 2015-05-02: 1000 mL via INTRAVENOUS

## 2015-05-02 MED ORDER — LIDOCAINE HCL (CARDIAC) 20 MG/ML IV SOLN
INTRAVENOUS | Status: DC | PRN
  Administered 2015-05-02: 70 mg via INTRAVENOUS

## 2015-05-02 SURGICAL SUPPLY — 39 items
APL SKNCLS STERI-STRIP NONHPOA (GAUZE/BANDAGES/DRESSINGS) ×1
BENZOIN TINCTURE PRP APPL 2/3 (GAUZE/BANDAGES/DRESSINGS) ×2 IMPLANT
CANISTER SUCTION 2500CC (MISCELLANEOUS) ×2 IMPLANT
CHLORAPREP W/TINT 26ML (MISCELLANEOUS) ×2 IMPLANT
CLIP LIGATING HEMO O LOK GREEN (MISCELLANEOUS) ×2 IMPLANT
CLSR STERI-STRIP ANTIMIC 1/2X4 (GAUZE/BANDAGES/DRESSINGS) ×2 IMPLANT
COVER SURGICAL LIGHT HANDLE (MISCELLANEOUS) ×2 IMPLANT
COVER TRANSDUCER ULTRASND (DRAPES) ×2 IMPLANT
DEVICE TROCAR PUNCTURE CLOSURE (ENDOMECHANICALS) ×2 IMPLANT
ELECT REM PT RETURN 9FT ADLT (ELECTROSURGICAL) ×2
ELECTRODE REM PT RTRN 9FT ADLT (ELECTROSURGICAL) ×1 IMPLANT
GAUZE SPONGE 2X2 8PLY STRL LF (GAUZE/BANDAGES/DRESSINGS) ×2 IMPLANT
GLOVE BIO SURGEON STRL SZ7.5 (GLOVE) ×2 IMPLANT
GLOVE BIOGEL PI IND STRL 7.0 (GLOVE) ×3 IMPLANT
GLOVE BIOGEL PI INDICATOR 7.0 (GLOVE) ×3
GLOVE SURG SS PI 6.5 STRL IVOR (GLOVE) ×2 IMPLANT
GOWN STRL REUS W/ TWL LRG LVL3 (GOWN DISPOSABLE) ×2 IMPLANT
GOWN STRL REUS W/ TWL XL LVL3 (GOWN DISPOSABLE) ×1 IMPLANT
GOWN STRL REUS W/TWL LRG LVL3 (GOWN DISPOSABLE) ×4
GOWN STRL REUS W/TWL XL LVL3 (GOWN DISPOSABLE) ×2
KIT BASIN OR (CUSTOM PROCEDURE TRAY) ×2 IMPLANT
KIT ROOM TURNOVER OR (KITS) ×2 IMPLANT
NEEDLE INSUFFLATION 14GA 120MM (NEEDLE) ×2 IMPLANT
NS IRRIG 1000ML POUR BTL (IV SOLUTION) ×2 IMPLANT
PAD ARMBOARD 7.5X6 YLW CONV (MISCELLANEOUS) ×4 IMPLANT
POUCH RETRIEVAL ECOSAC 10 (ENDOMECHANICALS) ×1 IMPLANT
POUCH RETRIEVAL ECOSAC 10MM (ENDOMECHANICALS) ×1
SCISSORS LAP 5X35 DISP (ENDOMECHANICALS) ×2 IMPLANT
SET IRRIG TUBING LAPAROSCOPIC (IRRIGATION / IRRIGATOR) ×2 IMPLANT
SLEEVE ENDOPATH XCEL 5M (ENDOMECHANICALS) ×2 IMPLANT
SPECIMEN JAR SMALL (MISCELLANEOUS) ×2 IMPLANT
SPONGE GAUZE 2X2 STER 10/PKG (GAUZE/BANDAGES/DRESSINGS) ×2
SUT MNCRL AB 3-0 PS2 18 (SUTURE) ×2 IMPLANT
TOWEL OR 17X24 6PK STRL BLUE (TOWEL DISPOSABLE) ×2 IMPLANT
TOWEL OR 17X26 10 PK STRL BLUE (TOWEL DISPOSABLE) ×2 IMPLANT
TRAY LAPAROSCOPIC MC (CUSTOM PROCEDURE TRAY) ×2 IMPLANT
TROCAR XCEL NON-BLD 11X100MML (ENDOMECHANICALS) ×2 IMPLANT
TROCAR XCEL NON-BLD 5MMX100MML (ENDOMECHANICALS) ×2 IMPLANT
TUBING INSUFFLATION (TUBING) ×2 IMPLANT

## 2015-05-02 NOTE — H&P (Signed)
Kimberly Anderson is an 50 y.o. female.   Chief Complaint: Abdominal Pain HPI: Patient complains of right sided abdominal and epigastric pain since 4am 05/01/15. She states that the pain is throbbing and radiates to the center of her back. The pain is usually intermittent, but this episode has not gone away. Patient also complains of nausea and vomiting (x4) and constipation for the past week. Her pain does get worse after eating fatty foods. Current pain rating at 5/10. Patient was diagnosed with gallstones 2 years ago and has come to the ED for abdominal pain related to the gallstones before. The last episode in July 2016.    Past Medical History  Diagnosis Date  . 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 No pertinent past medical history   . Obesity   . Fibroids   . Gall stones   . Hypertension     Past Surgical History  Procedure Laterality Date  . Bilateral tubal ligation  1996  . Svd      x 3  . Abdominal hysterectomy  03/11/2011    Procedure: HYSTERECTOMY ABDOMINAL;  Surgeon: Juliene Pina C. Hulan Fray, MD;  Location: [facility];  Service: Gynecology;  Laterality: N/A;  Possible removal of tubes & ovaries  . Salpingoophorectomy  03/11/2011    Procedure: SALPINGO OOPHERECTOMY;  Surgeon: Myra C. Hulan Fray, MD;  Location: [facility];  Service: Gynecology;  Laterality: Bilateral;    Family History  Problem Relation Age of Onset  . Diabetes type II Mother     Not currently on insulin but had been at one time  . Hypertension Mother   . Hyperlipidemia Mother   . Hypertension Father   . Hyperlipidemia Father   . Prostate cancer Father 33    s/p radiation  . Stroke Father 68  . Coronary artery disease Mother     s/p stents  . Fibroids Sister     s/p fibroidectomy 2/2 dub   Social History:  reports that she quit smoking about 4 years ago. Her smoking use included Cigarettes. She has a 10.5 pack-year smoking history. She has never used smokeless tobacco. She reports that she does not drink  alcohol or use illicit drugs.  Allergies: No Known Allergies   (Not in a hospital admission)  Results for orders placed or performed during the hospital encounter of 05/01/15 (from the past 48 hour(s))  Lipase, blood     Status: None   Collection Time: 05/01/15  8:51 PM  Result Value Ref Range   Lipase 24 11 - 51 U/L  Comprehensive metabolic panel     Status: Abnormal   Collection Time: 05/01/15  8:51 PM  Result Value Ref Range   Sodium 141 135 - 145 mmol/L   Potassium 3.9 3.5 - 5.1 mmol/L   Chloride 102 101 - 111 mmol/L   CO2 26 22 - 32 mmol/L   Glucose, Bld 124 (H) 65 - 99 mg/dL   BUN 10 6 - 20 mg/dL   Creatinine, Ser 0.74 0.44 - 1.00 mg/dL   Calcium 9.6 8.9 - 10.3 mg/dL   Total Protein 7.2 6.5 - 8.1 g/dL   Albumin 4.1 3.5 - 5.0 g/dL   AST 22 15 - 41 U/L   ALT 24 14 - 54 U/L   Alkaline Phosphatase 54 38 - 126 U/L   Total Bilirubin 0.7 0.3 - 1.2 mg/dL   GFR calc non Af Amer >60 >60 mL/min   GFR calc Af Amer >60 >  60 mL/min    Comment: (NOTE) The eGFR has been calculated using the CKD EPI equation. This calculation has not been validated in all clinical situations. eGFR's persistently <60 mL/min signify possible Chronic Kidney Disease.    Anion gap 13 5 - 15  CBC     Status: Abnormal   Collection Time: 05/01/15  8:51 PM  Result Value Ref Range   WBC 11.9 (H) 4.0 - 10.5 K/uL   RBC 4.48 3.87 - 5.11 MIL/uL   Hemoglobin 12.1 12.0 - 15.0 g/dL   HCT 37.0 36.0 - 46.0 %   MCV 82.6 78.0 - 100.0 fL   MCH 27.0 26.0 - 34.0 pg   MCHC 32.7 30.0 - 36.0 g/dL   RDW 13.4 11.5 - 15.5 %   Platelets 288 150 - 400 K/uL  Urinalysis, Routine w reflex microscopic (not at [facility])     Status: Abnormal   Collection Time: 05/01/15  8:56 PM  Result Value Ref Range   Color, Urine YELLOW YELLOW   APPearance CLOUDY (A) CLEAR   Specific Gravity, Urine 1.018 1.005 - 1.030   pH 7.5 5.0 - 8.0   Glucose, UA NEGATIVE NEGATIVE mg/dL   Hgb urine dipstick NEGATIVE NEGATIVE   Bilirubin Urine NEGATIVE  NEGATIVE   Ketones, ur NEGATIVE NEGATIVE mg/dL   Protein, ur NEGATIVE NEGATIVE mg/dL   Nitrite NEGATIVE NEGATIVE   Leukocytes, UA NEGATIVE NEGATIVE    Comment: MICROSCOPIC NOT DONE ON URINES WITH NEGATIVE PROTEIN, BLOOD, LEUKOCYTES, NITRITE, OR GLUCOSE <1000 mg/dL.   US Abdomen Limited Ruq  05/02/2015  CLINICAL DATA:  Acute onset of right upper quadrant abdominal pain. Initial encounter. EXAM: US ABDOMEN LIMITED - RIGHT UPPER QUADRANT COMPARISON:  Abdominal ultrasound performed 09/29/2014, and CT of the abdomen and pelvis from 02/25/2012 FINDINGS: Gallbladder: Diffuse gallbladder wall thickening is noted. Stones are seen dependently within the gallbladder, measuring up to 1.2 cm in size. No pericholecystic fluid is seen. A positive ultrasonographic Murphy's sign is elicited, concerning for mild acute cholecystitis. Common bile duct: Diameter: 0.4 cm, within normal limits in caliber. Liver: No focal lesion identified. Within normal limits in parenchymal echogenicity. IMPRESSION: Diffuse gallbladder wall thickening, with underlying cholelithiasis and a positive ultrasonographic Murphy's sign, concerning for mild acute cholecystitis. Electronically Signed   By: Garald Balding M.D.   On: 05/02/2015 02:13    Review of Systems  HENT:       Tooth ache  Respiratory: Negative for shortness of breath.   Cardiovascular: Negative for chest pain.  Gastrointestinal: Positive for nausea, vomiting, abdominal pain and constipation.    Blood pressure 129/71, pulse 62, temperature 98.3 F (36.8 C), temperature source Oral, resp. rate 14, last menstrual period 03/05/2011, SpO2 99 %. Physical Exam  Constitutional: She is oriented to person, place, and time. She appears well-developed and well-nourished.  HENT:  Head: Normocephalic and atraumatic.  Eyes: EOM are normal.  Cardiovascular: Normal rate and regular rhythm.   Respiratory: Breath sounds normal.  GI: Soft. There is tenderness.  Positive murphy's sign   Neurological: She is alert and oriented to person, place, and time.     Assessment/Plan Acute cholecystitis - Cholecystectomy; Continue antibiotics HTN - continue lisinopril  VTE - SCDs; Lovenox FEN - NPO; PRN pain control; continue NS fluids   Lehman Prom, Student-PA 05/02/2015, 3:38 AM

## 2015-05-02 NOTE — Anesthesia Procedure Notes (Signed)
Procedure Name: Intubation Date/Time: 05/02/2015 2:20 PM Performed by: Mariea Clonts Pre-anesthesia Checklist: Emergency Drugs available, Suction available, Timeout performed, Patient identified and Patient being monitored Patient Re-evaluated:Patient Re-evaluated prior to inductionOxygen Delivery Method: Circle system utilized Intubation Type: IV induction Ventilation: Mask ventilation without difficulty Laryngoscope Size: Miller and 2 Grade View: Grade II Tube type: Oral Tube size: 7.5 mm Number of attempts: 1 Placement Confirmation: ETT inserted through vocal cords under direct vision,  breath sounds checked- equal and bilateral and positive ETCO2 Tube secured with: Tape Dental Injury: Teeth and Oropharynx as per pre-operative assessment

## 2015-05-02 NOTE — Discharge Instructions (Signed)
Laparoscopic Cholecystectomy, Care After °Refer to this sheet in the next few weeks. These instructions provide you with information about caring for yourself after your procedure. Your health care provider may also give you more specific instructions. Your treatment has been planned according to current medical practices, but problems sometimes occur. Call your health care provider if you have any problems or questions after your procedure. °WHAT TO EXPECT AFTER THE PROCEDURE °After your procedure, it is common to have: °· Pain at your incision sites. You will be given pain medicines to control your pain. °· Mild nausea or vomiting. This should improve after the first 24 hours. °· Bloating and possible shoulder pain from the gas that was used during the procedure. This will improve after the first 24 hours. °HOME CARE INSTRUCTIONS °Incision Care °· Follow instructions from your health care provider about how to take care of your incisions. Make sure you: °¨ Wash your hands with soap and water before you change your bandage (dressing). If soap and water are not available, use hand sanitizer. °¨ Change your dressing as told by your health care provider. °¨ Leave stitches (sutures), skin glue, or adhesive strips in place. These skin closures may need to be in place for 2 weeks or longer. If adhesive strip edges start to loosen and curl up, you may trim the loose edges. Do not remove adhesive strips completely unless your health care provider tells you to do that. °· Do not take baths, swim, or use a hot tub until your health care provider approves. Ask your health care provider if you can take showers. You may only be allowed to take sponge baths for bathing. °General Instructions °· Take over-the-counter and prescription medicines only as told by your health care provider. °· Do not drive or operate heavy machinery while taking prescription pain medicine. °· Return to your normal diet as told by your health care  provider. °· Do not lift anything that is heavier than 10 lb (4.5 kg). °· Do not play contact sports for one week or until your health care provider approves. °SEEK MEDICAL CARE IF:  °· You have redness, swelling, or pain at the site of your incision. °· You have fluid, blood, or pus coming from your incision. °· You notice a bad smell coming from your incision area. °· Your surgical incisions break open. °· You have a fever. °SEEK IMMEDIATE MEDICAL CARE IF: °· You develop a rash. °· You have difficulty breathing. °· You have chest pain. °· You have increasing pain in your shoulders (shoulder strap areas). °· You faint or have dizzy episodes while you are standing. °· You have severe pain in your abdomen. °· You have nausea or vomiting that lasts for more than one day. °  °This information is not intended to replace advice given to you by your health care provider. Make sure you discuss any questions you have with your health care provider. °  °Document Released: 02/18/2005 Document Revised: 11/09/2014 Document Reviewed: 09/30/2012 °Elsevier Interactive Patient Education ©2016 [employer] Inc[employer] °CCS ______[facility] °LAPAROSCOPIC SURGERY: POST OP INSTRUCTIONS °Always review your discharge instruction sheet given to you by the facility where your surgery was performed. °IF YOU HAVE DISABILITY OR FAMILY LEAVE FORMS, YOU MUST BRING THEM TO THE OFFICE FOR PROCESSING.   °DO NOT GIVE THEM TO YOUR DOCTOR. ° °1. A prescription for pain medication may be given to you upon discharge.  Take your pain medication as prescribed, if needed.  If narcotic   pain medicine is not needed, then you may take acetaminophen (Tylenol) or ibuprofen (Advil) as needed. °2. Take your usually prescribed medications unless otherwise directed. °3. If you need a refill on your pain medication, please contact your pharmacy.  They will contact our office to request authorization. Prescriptions will not be filled after 5pm or on  week-ends. °4. You should follow a light diet the first few days after arrival home, such as soup and crackers, etc.  Be sure to include lots of fluids daily. °5. Most patients will experience some swelling and bruising in the area of the incisions.  Ice packs will help.  Swelling and bruising can take several days to resolve.  °6. It is common to experience some constipation if taking pain medication after surgery.  Increasing fluid intake and taking a stool softener (such as Colace) will usually help or prevent this problem from occurring.  A mild laxative (Milk of Magnesia or Miralax) should be taken according to package instructions if there are no bowel movements after 48 hours. °7. Unless discharge instructions indicate otherwise, you may remove your bandages 24-48 hours after surgery, and you may shower at that time.  You may have steri-strips (small skin tapes) in place directly over the incision.  These strips should be left on the skin for 7-10 days.  If your surgeon used skin glue on the incision, you may shower in 24 hours.  The glue will flake off over the next 2-3 weeks.  Any sutures or staples will be removed at the office during your follow-up visit. °8. ACTIVITIES:  You may resume regular (light) daily activities beginning the next day--such as daily self-care, walking, climbing stairs--gradually increasing activities as tolerated.  You may have sexual intercourse when it is comfortable.  Refrain from any heavy lifting or straining until approved by your doctor. °a. You may drive when you are no longer taking prescription pain medication, you can comfortably wear a seatbelt, and you can safely maneuver your car and apply brakes. °b. RETURN TO WORK:  __________________________________________________________ °9. You should see your doctor in the office for a follow-up appointment approximately 2-3 weeks after your surgery.  Make sure that you call for this appointment within a day or two after you  arrive home to insure a convenient appointment time. °10. OTHER INSTRUCTIONS: __________________________________________________________________________________________________________________________ __________________________________________________________________________________________________________________________ °WHEN TO CALL YOUR DOCTOR: °1. Fever over 101.0 °2. Inability to urinate °3. Continued bleeding from incision. °4. Increased pain, redness, or drainage from the incision. °5. Increasing abdominal pain ° °The clinic staff is available to answer your questions during regular business hours.  Please don’t hesitate to call and ask to speak to one of the nurses for clinical concerns.  If you have a medical emergency, go to the nearest emergency room or call 911.  A surgeon from [facility] is always on call at the hospital. °[address], [address], [place], [place]  [address] ? [address] [address], [place], [place]   [address] °[phone] ? [phone] ? FAX [phone] °Web site: [email or web address] ° °

## 2015-05-02 NOTE — Anesthesia Postprocedure Evaluation (Signed)
Anesthesia Post Note  Patient: Kimberly Anderson  Procedure(s) Performed: Procedure(s) (LRB): LAPAROSCOPIC CHOLECYSTECTOMY (N/A)  Patient location during evaluation: PACU Anesthesia Type: General Level of consciousness: awake and alert and oriented Pain management: pain level controlled Vital Signs Assessment: post-procedure vital signs reviewed and stable Respiratory status: spontaneous breathing, nonlabored ventilation and respiratory function stable Cardiovascular status: blood pressure returned to baseline and stable Postop Assessment: no signs of nausea or vomiting Anesthetic complications: no    Last Vitals:  Filed Vitals:   05/02/15 1617 05/02/15 1630  BP: 138/75 143/74  Pulse: 74 72  Temp: 36.5 C 36.3 C  Resp: 14 15    Last Pain:  Filed Vitals:   05/02/15 1631  PainSc: 6                  Laura-Lee Villegas A.

## 2015-05-02 NOTE — Anesthesia Preprocedure Evaluation (Addendum)
Anesthesia Evaluation  Patient identified by MRN, date of birth, ID band Patient awake    Reviewed: Allergy & Precautions, H&P , NPO status , Patient's Chart, lab work & pertinent test results  Airway Mallampati: III  TM Distance: >3 FB Neck ROM: Full    Dental no notable dental hx. (+) Teeth Intact, Dental Advisory Given   Pulmonary neg pulmonary ROS, former smoker,    Pulmonary exam normal breath sounds clear to auscultation       Cardiovascular hypertension, Pt. on medications  Rhythm:Regular Rate:Normal     Neuro/Psych negative neurological ROS  negative psych ROS   GI/Hepatic negative GI ROS, Neg liver ROS,   Endo/Other  negative endocrine ROS  Renal/GU negative Renal ROS  negative genitourinary   Musculoskeletal   Abdominal   Peds  Hematology negative hematology ROS (+)   Anesthesia Other Findings   Reproductive/Obstetrics negative OB ROS                            Anesthesia Physical Anesthesia Plan  ASA: II  Anesthesia Plan: General   Post-op Pain Management:    Induction: Intravenous  Airway Management Planned: Oral ETT  Additional Equipment:   Intra-op Plan:   Post-operative Plan: Extubation in OR  Informed Consent: I have reviewed the patients History and Physical, chart, labs and discussed the procedure including the risks, benefits and alternatives for the proposed anesthesia with the patient or authorized representative who has indicated his/her understanding and acceptance.   Dental advisory given  Plan Discussed with: CRNA  Anesthesia Plan Comments:         Anesthesia Quick Evaluation

## 2015-05-02 NOTE — Transfer of Care (Signed)
Immediate Anesthesia Transfer of Care Note  Patient: Kimberly Anderson  Procedure(s) Performed: Procedure(s): LAPAROSCOPIC CHOLECYSTECTOMY (N/A)  Patient Location: PACU  Anesthesia Type:General  Level of Consciousness: awake, alert  and responds to stimulation  Airway & Oxygen Therapy: Patient Spontanous Breathing and Patient connected to nasal cannula oxygen  Post-op Assessment: Report given to RN and Post -op Vital signs reviewed and stable  Post vital signs: Reviewed and stable  Last Vitals:  Filed Vitals:   05/02/15 0547 05/02/15 1539  BP: 127/61   Pulse: 64   Temp: 36.5 C 36.4 C  Resp: 17     Complications: No apparent anesthesia complications

## 2015-05-02 NOTE — Progress Notes (Signed)
Subjective: Pain RUQ, had this in the past.  Pain did not get better this time.    Objective: Vital signs in last 24 hours: Temp:  [97.7 F (36.5 C)-98.3 F (36.8 C)] 97.7 F (36.5 C) (02/28 0547) Pulse Rate:  [61-76] 64 (02/28 0547) Resp:  [14-18] 17 (02/28 0547) BP: (107-144)/(61-101) 127/61 mmHg (02/28 0547) SpO2:  [97 %-100 %] 100 % (02/28 0547) Weight:  [98.612 kg (217 lb 6.4 oz)] 98.612 kg (217 lb 6.4 oz) (02/28 0547) Last BM Date: 05/01/15 Afebrile, VSS wBC 11.9 last PM CMP/lipse is normal Ultrasound:  Diffuse gallbladder wall thickening, with underlying cholelithiasis and a positive ultrasonographic Murphy's sign, concerning for mild acute cholecystitis. Intake/Output from previous day: 02/27 0701 - 02/28 0700 In: -  Out: 300 [Urine:300] Intake/Output this shift:    General appearance: alert, cooperative and no distress Resp: clear to auscultation bilaterally GI: soft, tender RUQ. +BS  Lab Results:   Recent Labs  05/01/15 2051  WBC 11.9*  HGB 12.1  HCT 37.0  PLT 288    BMET  Recent Labs  05/01/15 2051  NA 141  K 3.9  CL 102  CO2 26  GLUCOSE 124*  BUN 10  CREATININE 0.74  CALCIUM 9.6   PT/INR No results for input(s): LABPROT, INR in the last 72 hours.   Recent Labs Lab 05/01/15 2051  AST 22  ALT 24  ALKPHOS 54  BILITOT 0.7  PROT 7.2  ALBUMIN 4.1     Lipase     Component Value Date/Time   LIPASE 24 05/01/2015 2051     Studies/Results: US Abdomen Limited Ruq  05/02/2015  CLINICAL DATA:  Acute onset of right upper quadrant abdominal pain. Initial encounter. EXAM: US ABDOMEN LIMITED - RIGHT UPPER QUADRANT COMPARISON:  Abdominal ultrasound performed 09/29/2014, and CT of the abdomen and pelvis from 02/25/2012 FINDINGS: Gallbladder: Diffuse gallbladder wall thickening is noted. Stones are seen dependently within the gallbladder, measuring up to 1.2 cm in size. No pericholecystic fluid is seen. A positive ultrasonographic Murphy's  sign is elicited, concerning for mild acute cholecystitis. Common bile duct: Diameter: 0.4 cm, within normal limits in caliber. Liver: No focal lesion identified. Within normal limits in parenchymal echogenicity. IMPRESSION: Diffuse gallbladder wall thickening, with underlying cholelithiasis and a positive ultrasonographic Murphy's sign, concerning for mild acute cholecystitis. Electronically Signed   By: Garald Balding M.D.   On: 05/02/2015 02:13    Medications: . cefTRIAXone (ROCEPHIN)  IV  2 g Intravenous Q24H  . dextrose 5% lactated ringers  1,000 mL Intravenous Once  . [START ON 05/03/2015] enoxaparin (LOVENOX) injection  40 mg Subcutaneous Q24H  . lisinopril  10 mg Oral Daily   And  . hydrochlorothiazide  12.5 mg Oral Daily  .  morphine injection  4 mg Intravenous Once  . niacin  100 mg Oral QPM  . pantoprazole (PROTONIX) IV  40 mg Intravenous QHS  . vitamin B-12  100 mcg Oral QPM  hydrALAZINE, HYDROmorphone (DILAUDID) injection, Influenza vac split quadrivalent PF, ondansetron **OR** ondansetron (ZOFRAN) IV  . dextrose 5 % and 0.45 % NaCl with KCl 20 mEq/L 100 mL/hr at 05/02/15 0601   Prior to Admission medications   Medication Sig Start Date End Date Taking? Authorizing Provider  acidophilus (RISAQUAD) CAPS Take 1 capsule by mouth daily.   Yes Historical Provider, MD  Ascorbic Acid (VITAMIN C PO) Take 1 tablet by mouth daily.   Yes Historical Provider, MD  Black Cohosh 160 MG CAPS Take 1 capsule by  mouth daily.   Yes Historical Provider, MD  Calcium Carbonate-Vitamin D (CALTRATE 600+D PO) Take 600 mg by mouth daily.   Yes Historical Provider, MD  HYDROcodone-acetaminophen (NORCO) 5-325 MG tablet Take 1 tablet by mouth every 6 (six) hours as needed for moderate pain. 01/21/15  Yes Melony Overly, MD  ibuprofen (ADVIL,MOTRIN) 600 MG tablet Take 1 tablet (600 mg total) by mouth every 6 (six) hours as needed for moderate pain. 01/21/15  Yes Melony Overly, MD  lisinopril-hydrochlorothiazide  (PRINZIDE,ZESTORETIC) 10-12.5 MG per tablet Take 1 tablet by mouth daily. 09/19/14  Yes Historical Provider, MD  Multiple Vitamin (MULTIVITAMIN WITH MINERALS) TABS Take 1 tablet by mouth every evening.   Yes Historical Provider, MD  niacin 100 MG tablet Take 100 mg by mouth every evening.   Yes Historical Provider, MD  oxyCODONE-acetaminophen (PERCOCET) 5-325 MG per tablet Take 1 tablet by mouth every 4 (four) hours as needed for moderate pain. [id number]  Yes Delora Fuel, MD  phentermine (ADIPEX-P) 37.5 MG tablet Take 37.5 mg by mouth daily before breakfast.   Yes Historical Provider, MD  vitamin B-12 (CYANOCOBALAMIN) 100 MCG tablet Take 100 mcg by mouth every evening.   Yes Historical Provider, MD  amoxicillin (AMOXIL) 500 MG capsule Take 1 capsule (500 mg total) by mouth 2 (two) times daily. Patient not taking: Reported on 05/02/2015 01/21/15   Melony Overly, MD     Assessment/Plan Acute cholecystitis Hypertension  Body mass index is 32 S/p hysterectomy/salpingoohrectomy 03/2011 Antibiotics:  Ceftriaxone day 1   Plan:  Cholecystectomy later today, risk and benefits discussed.  Dr. Rosendo Gros will see her before surgery.      Kimberly Anderson 05/02/2015

## 2015-05-02 NOTE — Op Note (Signed)
05/02/2015  3:20 PM  PATIENT:  Kimberly Anderson  50 y.o. female  PRE-OPERATIVE DIAGNOSIS:  Acute Cholecysititis  POST-OPERATIVE DIAGNOSIS:  Acute Cholecysititis, cholelithiasis  PROCEDURE:  Procedure(s): LAPAROSCOPIC CHOLECYSTECTOMY (N/A)  SURGEON:  Surgeon(s) and Role:    * Ralene Ok, MD - Primary  ANESTHESIA:   local and general  EBL: <5cc Total I/O In: 783.3 [I.V.:783.3] Out: -   BLOOD ADMINISTERED:none  DRAINS: none   LOCAL MEDICATIONS USED:  BUPIVICAINE   SPECIMEN:  Source of Specimen:  galbladder  DISPOSITION OF SPECIMEN:  PATHOLOGY  COUNTS:  YES  TOURNIQUET:  * No tourniquets in log *  DICTATION: .Dragon Dictation  The patient was taken to the operating and placed in the supine position with bilateral SCDs in place. The patient was prepped and draped in the usual sterile fashion. A time out was called and all facts were verified. A pneumoperitoneum was obtained via A Veress needle technique to a pressure of 32mm of mercury.  A 45mm trochar was then placed in the right upper quadrant under visualization, and there were no injuries to any abdominal organs. A 11 mm port was then placed in the umbilical region after infiltrating with local anesthesia under direct visualization. A second and third epigastric port and right lower quadrant port placement under direct visualization, respectively.    The gallbladder was identified and retracted, the peritoneum was then sharply dissected from the gallbladder and this dissection was carried down to Calot's triangle. The gallbladder was identified and stripped away circumferentially and seen going into the gallbladder 360, the critical angle was obtained.  2 clips were placed proximally one distally and the cystic duct transected. The cystic artery was identified and 2 clips placed proximally and one distally and transected. We then proceeded to remove the gallbladder off the hepatic fossa with Bovie cautery. A  retrieval bag was then placed in the abdomen and gallbladder placed in the bag. The hepatic fossa was then reexamined and hemostasis was achieved with Bovie cautery and was excellent at the end of the case. The subhepatic fossa and perihepatic fossa was then irrigated until the effluent was clear.  The gallbladder and bag were removed from the abdominal cavity. The 11 mm trocar fascia was reapproximated with the Endo Close #1 Vicryl x2. The pneumoperitoneum was evacuated and all trochars removed under direct visulalization. The skin was then closed with 4-0 Monocryl and the skin dressed with Steri-Strips, gauze, and tape. The patient was awaken from general anesthesia and taken to the recovery room in stable condition.  PLAN OF CARE: Admit for overnight observation  PATIENT DISPOSITION:  PACU - hemodynamically stable.   Delay start of Pharmacological VTE agent (>24hrs) due to surgical blood loss or risk of bleeding: not applicable

## 2015-05-03 ENCOUNTER — Encounter (HOSPITAL_COMMUNITY): Payer: Self-pay | Admitting: General Surgery

## 2015-05-03 ENCOUNTER — Encounter: Payer: Self-pay | Admitting: General Surgery

## 2015-05-03 MED ORDER — PHENOL 1.4 % MT LIQD
1.0000 | OROMUCOSAL | Status: DC | PRN
  Filled 2015-05-03: qty 177

## 2015-05-03 MED ORDER — OXYCODONE-ACETAMINOPHEN 5-325 MG PO TABS
1.0000 | ORAL_TABLET | ORAL | Status: DC | PRN
  Administered 2015-05-03: 2 via ORAL
  Filled 2015-05-03: qty 2

## 2015-05-03 MED ORDER — IBUPROFEN 200 MG PO TABS: ORAL_TABLET | ORAL | Status: DC

## 2015-05-03 MED ORDER — OXYCODONE-ACETAMINOPHEN 5-325 MG PO TABS: 1.0000 | ORAL_TABLET | ORAL | Status: DC | PRN

## 2015-05-03 MED ORDER — DIPHENHYDRAMINE HCL 25 MG PO CAPS: 25.0000 mg | ORAL_CAPSULE | Freq: Four times a day (QID) | ORAL | Status: DC | PRN

## 2015-05-03 MED ORDER — ACETAMINOPHEN 325 MG PO TABS: 650.0000 mg | ORAL_TABLET | Freq: Four times a day (QID) | ORAL | Status: DC | PRN

## 2015-05-03 NOTE — Progress Notes (Signed)
Pt discharged home with family this pm. Condition stable. Discharge instructions provided with no concerns expressed

## 2015-05-03 NOTE — Discharge Summary (Signed)
Physician Discharge Summary  Patient ID: Kimberly Anderson MRN: [id number] DOB/AGE: 1965-09-14 50 y.o.  Admit date: 05/01/2015 Discharge date: 05/03/2015  Admission Diagnoses:   Acute cholecystitis - Cholecystectomy; Continue antibiotics HTN - continue lisinopril    Discharge Diagnoses:  Acute Cholecysititis, cholelithiasis  Hypertension Active Problems:   Acute cholecystitis   PROCEDURES:   S/p laparoscopic cholecystectomy, 05/02/15, Dr. Marchia Meiers Course: Patient complains of right sided abdominal and epigastric pain since 4am 05/01/15. She states that the pain is throbbing and radiates to the center of her back. The pain is usually intermittent, but this episode has not gone away. Patient also complains of nausea and vomiting (x4) and constipation for the past week. Her pain does get worse after eating fatty foods. Current pain rating at 5/10. Patient was diagnosed with gallstones 2 years ago and has come to the ED for abdominal pain related to the gallstones before. The last episode in July 2016.  Pt admitted and placed on antibiotics and IV hydration.  She was taken to the OR the following AM.  She tolerated the procedure well.  We advanced her diet and she was ready for discharge the following day.    Condition on D/C:  Improved    Disposition: 01-Home or Self Care     Medication List    STOP taking these medications        amoxicillin 500 MG capsule  Commonly known as:  AMOXIL     HYDROcodone-acetaminophen 5-325 MG tablet  Commonly known as:  NORCO      TAKE these medications        acetaminophen 325 MG tablet  Commonly known as:  TYLENOL  Take 2 tablets (650 mg total) by mouth every 6 (six) hours as needed for mild pain, moderate pain, fever or headache.     acidophilus Caps capsule  Take 1 capsule by mouth daily.     Black Cohosh 160 MG Caps  Take 1 capsule by mouth daily.     CALTRATE 600+D PO  Take 600 mg by mouth daily.     ibuprofen 200 MG tablet  Commonly known as:  ADVIL,MOTRIN  You can take 2-3 tablets every 6 hours as needed for pain.     lisinopril-hydrochlorothiazide 10-12.5 MG tablet  Commonly known as:  PRINZIDE,ZESTORETIC  Take 1 tablet by mouth daily.     multivitamin with minerals Tabs tablet  Take 1 tablet by mouth every evening.     niacin 100 MG tablet  Take 100 mg by mouth every evening.     oxyCODONE-acetaminophen 5-325 MG tablet  Commonly known as:  PERCOCET/ROXICET  Take 1-2 tablets by mouth every 4 (four) hours as needed for moderate pain.     phentermine 37.5 MG tablet  Commonly known as:  ADIPEX-P  Take 37.5 mg by mouth daily before breakfast.     vitamin B-12 100 MCG tablet  Commonly known as:  CYANOCOBALAMIN  Take 100 mcg by mouth every evening.     VITAMIN C PO  Take 1 tablet by mouth daily.           Follow-up Information    Follow up with [facility] On 05/24/2015.   Specialty:  General Surgery   Why:  Your appointment is at 9:15AM.  Be at the office 30 minutes early for check in.   Contact information:   [address] [place] [address] [phone]       Follow up with  Philis Fendt, MD.   Specialty:  Internal Medicine   Why:  CAll and let him follow up with you.   Contact information:   [address] [place] [address] [phone]       Signed: Earnstine Regal 05/03/2015, 10:58 AM

## 2015-05-03 NOTE — Progress Notes (Signed)
1 Day Post-Op  Subjective: She is doing well.  Has not eaten yet.  Some itching, but not sure of the source.    Objective: Vital signs in last 24 hours: Temp:  [97.4 F (36.3 C)-98.8 F (37.1 C)] 98.8 F (37.1 C) (02/28 2100) Pulse Rate:  [65-75] 75 (02/28 2100) Resp:  [11-16] 16 (02/28 2100) BP: (120-143)/(71-77) 120/72 mmHg (02/28 2100) SpO2:  [99 %-100 %] 100 % (02/28 2100) Last BM Date: 05/01/15 Nothing PO recorded  Diet regular Afebrile, VSS No labs Intake/Output from previous day: 02/28 0701 - 03/01 0700 In: 2996.7 [I.V.:2996.7] Out: 400 [Urine:400] Intake/Output this shift:    General appearance: alert, cooperative and no distress GI: soft, sore, sites look fine.  Lab Results:   Recent Labs  05/01/15 2051  WBC 11.9*  HGB 12.1  HCT 37.0  PLT 288    BMET  Recent Labs  05/01/15 2051  NA 141  K 3.9  CL 102  CO2 26  GLUCOSE 124*  BUN 10  CREATININE 0.74  CALCIUM 9.6   PT/INR No results for input(s): LABPROT, INR in the last 72 hours.   Recent Labs Lab 05/01/15 2051  AST 22  ALT 24  ALKPHOS 54  BILITOT 0.7  PROT 7.2  ALBUMIN 4.1     Lipase     Component Value Date/Time   LIPASE 24 05/01/2015 2051     Studies/Results: US Abdomen Limited Ruq  05/02/2015  CLINICAL DATA:  Acute onset of right upper quadrant abdominal pain. Initial encounter. EXAM: US ABDOMEN LIMITED - RIGHT UPPER QUADRANT COMPARISON:  Abdominal ultrasound performed 09/29/2014, and CT of the abdomen and pelvis from 02/25/2012 FINDINGS: Gallbladder: Diffuse gallbladder wall thickening is noted. Stones are seen dependently within the gallbladder, measuring up to 1.2 cm in size. No pericholecystic fluid is seen. A positive ultrasonographic Murphy's sign is elicited, concerning for mild acute cholecystitis. Common bile duct: Diameter: 0.4 cm, within normal limits in caliber. Liver: No focal lesion identified. Within normal limits in parenchymal echogenicity. IMPRESSION: Diffuse  gallbladder wall thickening, with underlying cholelithiasis and a positive ultrasonographic Murphy's sign, concerning for mild acute cholecystitis. Electronically Signed   By: Garald Balding M.D.   On: 05/02/2015 02:13    Medications: . enoxaparin (LOVENOX) injection  40 mg Subcutaneous Q24H  . lisinopril  10 mg Oral Daily   And  . hydrochlorothiazide  12.5 mg Oral Daily  .  morphine injection  4 mg Intravenous Once  . niacin  100 mg Oral QPM  . pantoprazole (PROTONIX) IV  40 mg Intravenous QHS  . vitamin B-12  100 mcg Oral QPM    Assessment/Plan Acute Cholecysititis, cholelithiasis S/p laparoscopic cholecystectomy Hypertension Antibiotics:  2 days of ceftriaxone completed yesterday DVT:  Lovenox   Plan:  Advance her diet, mobilize her and let her go home later this AM.      Earnstine Regal 05/03/2015

## 2015-05-16 ENCOUNTER — Other Ambulatory Visit: Payer: Self-pay

## 2015-05-16 DIAGNOSIS — Z1231 Encounter for screening mammogram for malignant neoplasm of breast: Secondary | ICD-10-CM

## 2015-05-29 ENCOUNTER — Ambulatory Visit
Admission: RE | Admit: 2015-05-29 | Discharge: 2015-05-29 | Disposition: A | Discharge status: Home / Self Care | Payer: BLUE CROSS/BLUE SHIELD | Source: Ambulatory Visit

## 2015-05-29 DIAGNOSIS — Z1231 Encounter for screening mammogram for malignant neoplasm of breast: Secondary | ICD-10-CM

## 2015-12-19 ENCOUNTER — Other Ambulatory Visit: Payer: Self-pay | Admitting: Internal Medicine

## 2015-12-19 DIAGNOSIS — E2839 Other primary ovarian failure: Secondary | ICD-10-CM

## 2015-12-27 ENCOUNTER — Other Ambulatory Visit: Payer: BLUE CROSS/BLUE SHIELD

## 2016-04-12 IMAGING — US US ABDOMEN LIMITED
1 series · 14 of 25 positions shown · non-contrast
Comparison: Abdominal ultrasound performed 09/29/2014, and CT of
the abdomen and pelvis from 02/25/2012

CLINICAL DATA: Acute onset of right upper quadrant abdominal pain.
Initial encounter.

EXAM:
US ABDOMEN LIMITED - RIGHT UPPER QUADRANT

[Series 1: us abdomen limited · 0.23mm/px · 14 of 52 slices shown]
[im 1/52]
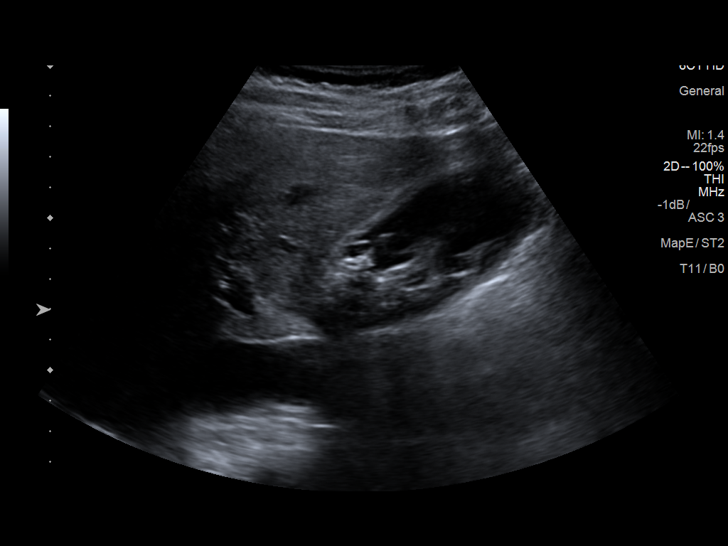
[im 5/52]
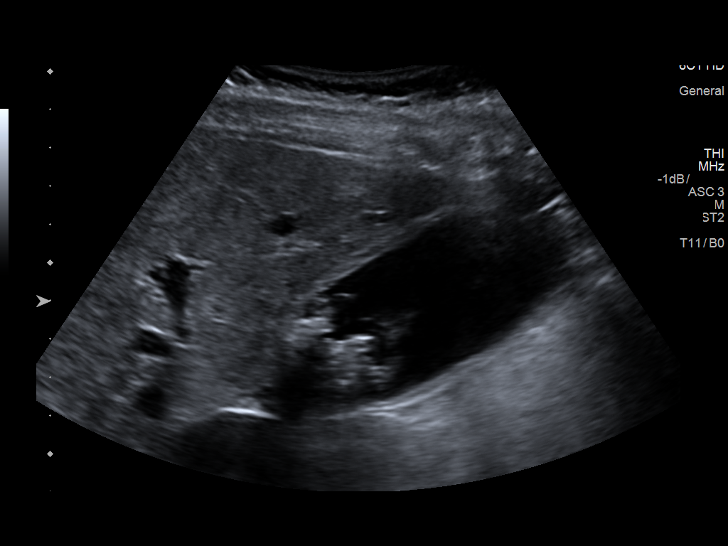
[im 9/52]
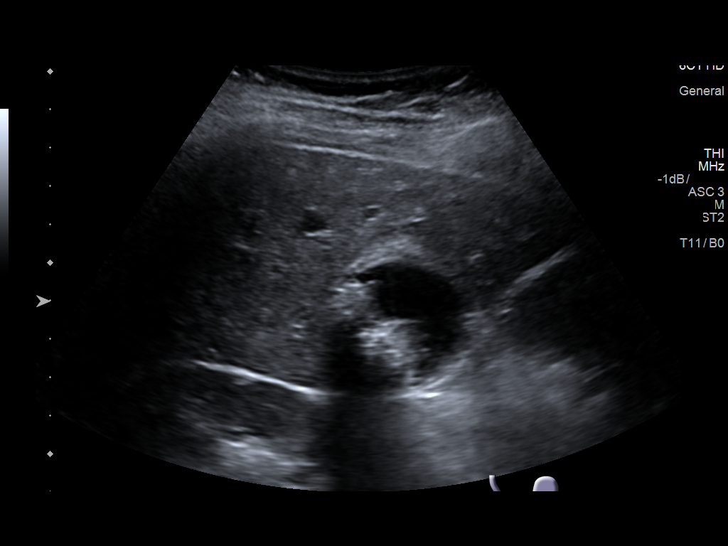
[im 13/52]
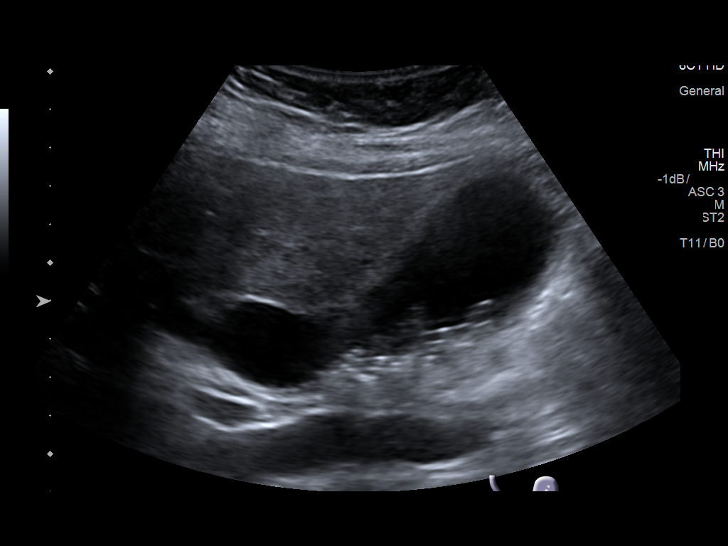
[im 18/52]
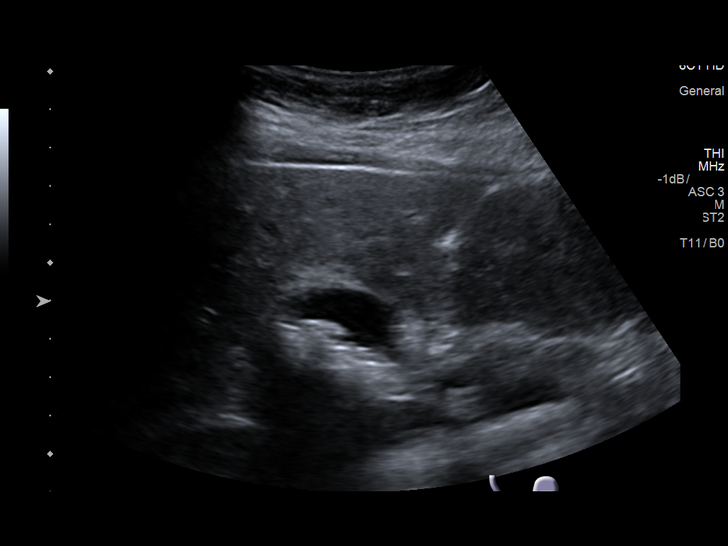
[im 20/52]
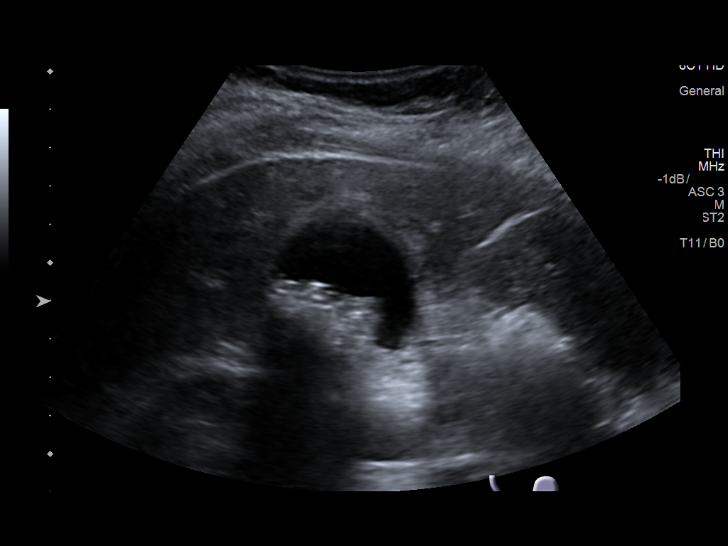
[im 24/52]
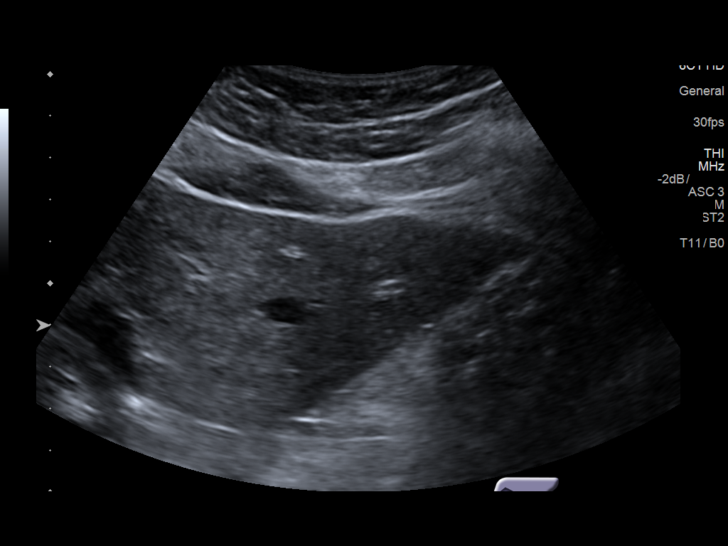
[im 28/52]
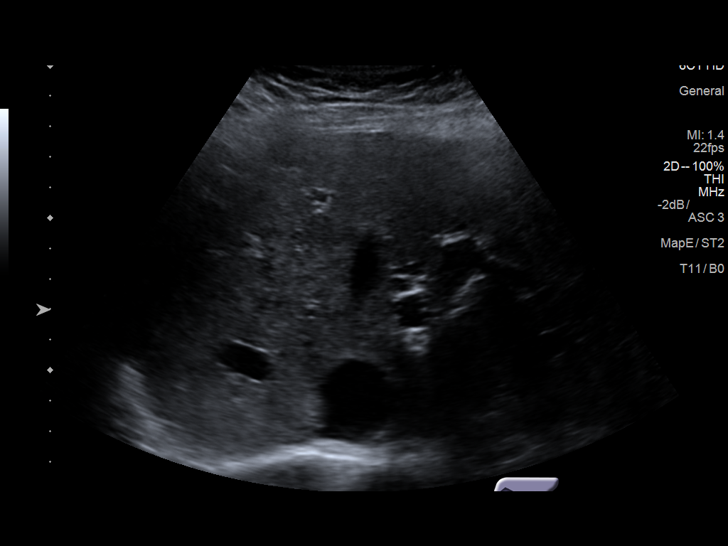
[im 32/52]
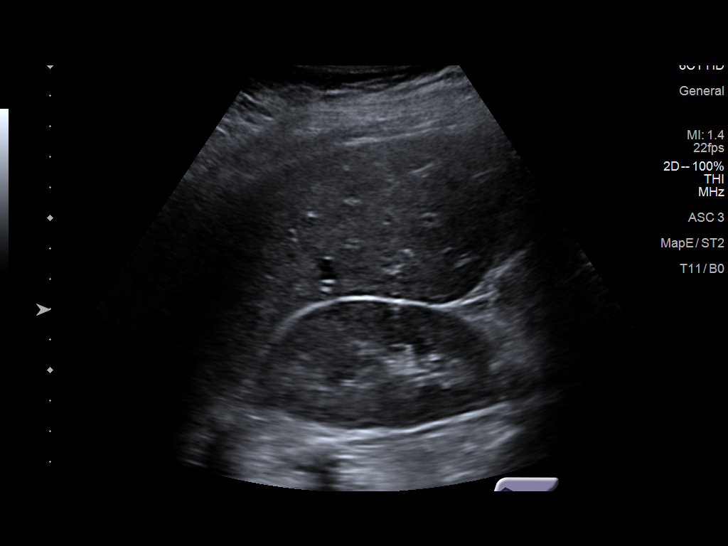
[im 35/52]
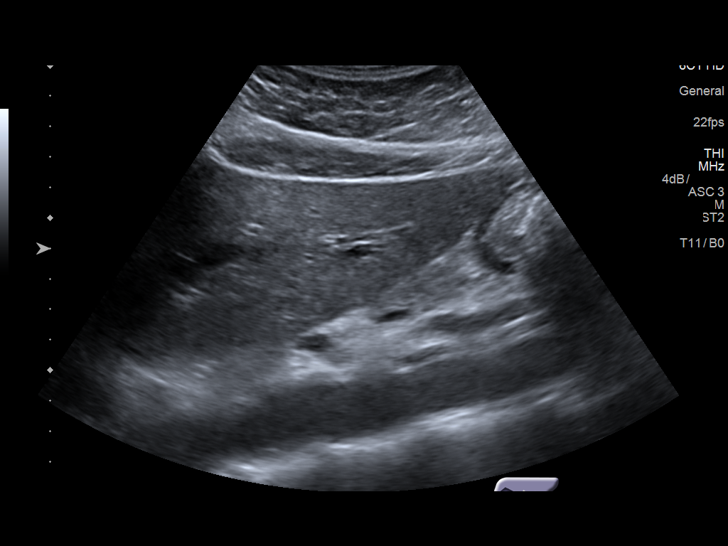
[im 39/52]
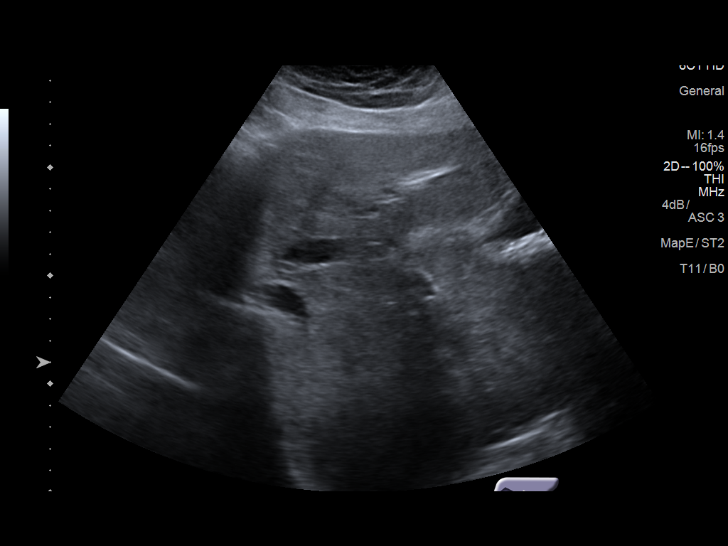
[im 43/52]
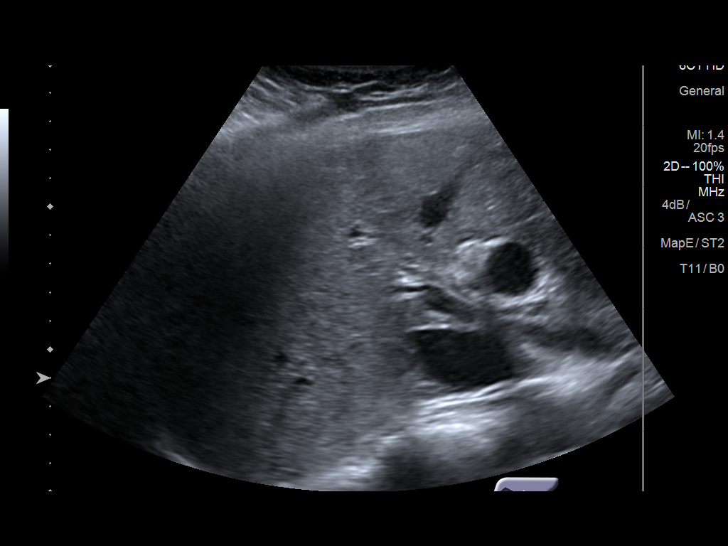
[im 47/52]
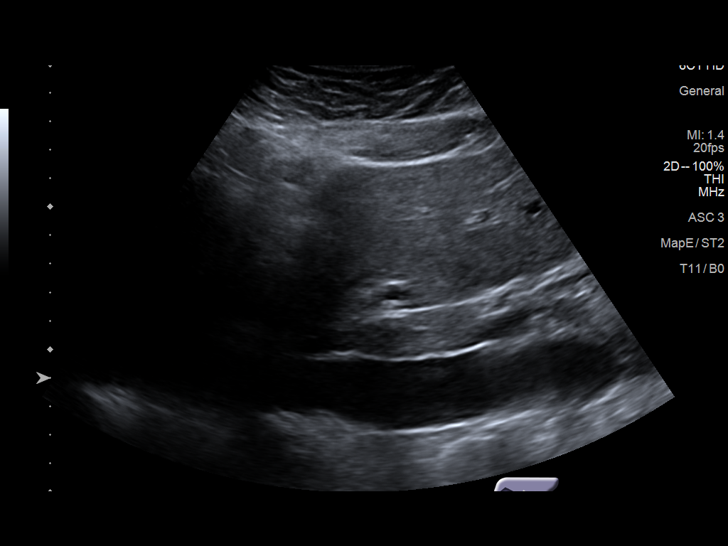
[im 52/52]
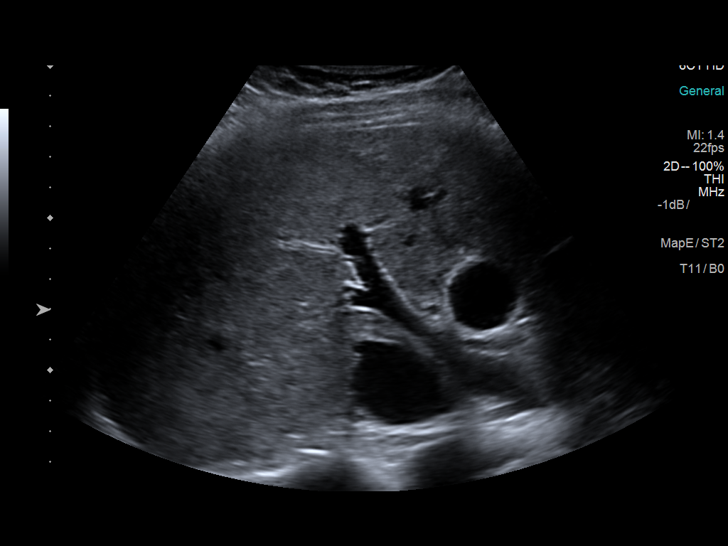

[14 of 25 positions shown; findings below may reference images not displayed]

FINDINGS: Gallbladder:

Diffuse gallbladder wall thickening is noted. Stones are seen
dependently within the gallbladder, measuring up to 1.2 cm in size.
No pericholecystic fluid is seen. A positive ultrasonographic
Murphy's sign is elicited, concerning for mild acute cholecystitis.

Common bile duct:

Diameter: 0.4 cm, within normal limits in caliber.

Liver:

No focal lesion identified. Within normal limits in parenchymal
echogenicity.
IMPRESSION: Diffuse gallbladder wall thickening, with underlying cholelithiasis
and a positive ultrasonographic Murphy's sign, concerning for mild
acute cholecystitis.

## 2016-05-28 ENCOUNTER — Encounter: Payer: Self-pay | Admitting: Podiatry

## 2016-05-28 ENCOUNTER — Ambulatory Visit (INDEPENDENT_AMBULATORY_CARE_PROVIDER_SITE_OTHER): Payer: BLUE CROSS/BLUE SHIELD | Admitting: Podiatry

## 2016-05-28 VITALS — BP 139/87 | HR 72 | Ht 69.0 in | Wt 225.0 lb

## 2016-05-28 DIAGNOSIS — M216X2 Other acquired deformities of left foot: Secondary | ICD-10-CM

## 2016-05-28 DIAGNOSIS — M79672 Pain in left foot: Secondary | ICD-10-CM | POA: Diagnosis not present

## 2016-05-28 DIAGNOSIS — M205X9 Other deformities of toe(s) (acquired), unspecified foot: Secondary | ICD-10-CM | POA: Diagnosis not present

## 2016-05-28 DIAGNOSIS — B351 Tinea unguium: Secondary | ICD-10-CM | POA: Diagnosis not present

## 2016-05-28 DIAGNOSIS — M79671 Pain in right foot: Secondary | ICD-10-CM | POA: Diagnosis not present

## 2016-05-28 DIAGNOSIS — M216X1 Other acquired deformities of right foot: Secondary | ICD-10-CM

## 2016-05-28 DIAGNOSIS — M21969 Unspecified acquired deformity of unspecified lower leg: Secondary | ICD-10-CM

## 2016-05-28 DIAGNOSIS — M216X9 Other acquired deformities of unspecified foot: Secondary | ICD-10-CM

## 2016-05-28 NOTE — Progress Notes (Signed)
SUBJECTIVE: 51 y.o. year old female presents complaining of feet are hurting bottom and top for duration of a couple of years. Also having fungus nail on right great toe. Pain is more later part of the day. On feet at work all day.  REVIEW OF SYSTEMS: A comprehensive review of systems was negative except for: Hypertension and gall bladder surgery in 2017.   OBJECTIVE: DERMATOLOGIC EXAMINATION: Thick dystrophic and discolored right great toe nail at distal 1/2 of plate. Left great toe is mildly affected.  VASCULAR EXAMINATION OF LOWER LIMBS: All pedal pulses are palpable with normal pulsation.  Capillary Filling times within 3 seconds in all digits.  No edema or erythema noted. Temperature gradient from tibial crest to dorsum of foot is within normal bilateral.  NEUROLOGIC EXAMINATION OF THE LOWER LIMBS: All epicritic and tactile sensations grossly intact. Sharp and Dull discriminatory sensations at the plantar ball of hallux is intact bilateral.   MUSCULOSKELETAL EXAMINATION: Positive for tight Achilles tendon bilateral. Elevated first ray bilateral with bunion deformity on right. STJ hyperpronation with weight bearing bilateral.  ASSESSMENT: Onychomycosis right great toe. Ankle equinus bilateral. 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 STJ hyperpronation bilateral.  PLAN: Reviewed clinical findings and available treatment options. Stretch exercise reviewed to practice daily. Metatarsal binder dispensed with instruction x 1 pair. All nails debrided. Need custom orthotics. Will call with insurance info.

## 2016-05-28 NOTE — Patient Instructions (Addendum)
Seen for painful feet and fungal nail on right great toe. Noted of tight Achilles tendon bilateral, weak first metatarsal bone, and traumatized and fungal infected right great toe nail due to abnormal foot function. Stretch exercise reviewed to practice daily. Metatarsal binder dispensed with instruction x 1 pair. All nails debrided. Need custom orthotics. Will call with insurance info.

## 2016-06-06 ENCOUNTER — Encounter: Payer: Self-pay | Admitting: Podiatry

## 2016-06-06 ENCOUNTER — Ambulatory Visit (INDEPENDENT_AMBULATORY_CARE_PROVIDER_SITE_OTHER): Payer: BLUE CROSS/BLUE SHIELD | Admitting: Podiatry

## 2016-06-06 DIAGNOSIS — M205X9 Other deformities of toe(s) (acquired), unspecified foot: Secondary | ICD-10-CM | POA: Diagnosis not present

## 2016-06-06 DIAGNOSIS — M216X2 Other acquired deformities of left foot: Secondary | ICD-10-CM | POA: Diagnosis not present

## 2016-06-06 DIAGNOSIS — M216X1 Other acquired deformities of right foot: Secondary | ICD-10-CM

## 2016-06-06 DIAGNOSIS — M79671 Pain in right foot: Secondary | ICD-10-CM

## 2016-06-06 DIAGNOSIS — M79672 Pain in left foot: Secondary | ICD-10-CM | POA: Diagnosis not present

## 2016-06-06 DIAGNOSIS — M21969 Unspecified acquired deformity of unspecified lower leg: Secondary | ICD-10-CM

## 2016-06-06 NOTE — Patient Instructions (Addendum)
Both feet casted for orthotics. Continue with daily stretch for tight Achilles tendon. Will call when orthotics are ready.

## 2016-06-06 NOTE — Progress Notes (Signed)
SUBJECTIVE: 51 y.o. year old female presents requesting for custom orthotics be made.  complaining of feet are hurting bottom and top for duration of a couple of years. Also having fungus nail on right great toe. Pain is more later part of the day. On feet at work all day.  REVIEW OF SYSTEMS: A comprehensive review of systems was negative except for: Hypertension and gall bladder surgery in 2017.   OBJECTIVE: DERMATOLOGIC EXAMINATION: Thick dystrophic and discolored right great toe nail at distal 1/2 of plate. Left great toe is mildly affected.  VASCULAR EXAMINATION OF LOWER LIMBS: All pedal pulses are palpable with normal pulsation.  Capillary Filling times within 3 seconds in all digits.  No edema or erythema noted. Temperature gradient from tibial crest to dorsum of foot is within normal bilateral.  NEUROLOGIC EXAMINATION OF THE LOWER LIMBS: All epicritic and tactile sensations grossly intact. Sharp and Dull discriminatory sensations at the plantar ball of hallux is intact bilateral.   MUSCULOSKELETAL EXAMINATION: Positive for tight Achilles tendon bilateral. Elevated first ray bilateral with bunion deformity on right. STJ hyperpronation with weight bearing bilateral.  Radiographic examination reveal  AP view; Flattening metatarsal head right, enlarged medial eminence of the first metatarsal head bilateral, Fibular sesamoid position at 4. Lateral view; Sagging of midtarsal bone, elevated first metatarsal head, anterior advancement of CYMA line, mild inferior calcaneal spur bilateral.  ASSESSMENT: Onychomycosis right great toe. Ankle equinus bilateral. 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 STJ hyperpronation bilateral.  PLAN: Reviewed clinical findings and available treatment options. Stretch exercise reviewed to practice daily. Metatarsal binder dispensed with instruction x 1 pair. All nails debrided. Need custom orthotics. Will  call with insurance info.

## 2016-06-11 ENCOUNTER — Ambulatory Visit (HOSPITAL_COMMUNITY)
Admission: EM | Admit: 2016-06-11 | Discharge: 2016-06-11 | Disposition: A | Discharge status: Home / Self Care | Payer: BLUE CROSS/BLUE SHIELD | Attending: Internal Medicine | Admitting: Internal Medicine

## 2016-06-11 ENCOUNTER — Encounter (HOSPITAL_COMMUNITY): Payer: Self-pay | Admitting: Emergency Medicine

## 2016-06-11 DIAGNOSIS — M545 Low back pain, unspecified: Secondary | ICD-10-CM

## 2016-06-11 DIAGNOSIS — M62838 Other muscle spasm: Secondary | ICD-10-CM | POA: Diagnosis not present

## 2016-06-11 DIAGNOSIS — S39012A Strain of muscle, fascia and tendon of lower back, initial encounter: Secondary | ICD-10-CM

## 2016-06-11 LAB — POCT URINALYSIS DIP (DEVICE)
BILIRUBIN URINE: NEGATIVE
GLUCOSE, UA: NEGATIVE mg/dL
Hgb urine dipstick: NEGATIVE
Leukocytes, UA: NEGATIVE
NITRITE: NEGATIVE
PH: 7 (ref 5.0–8.0)
PROTEIN: NEGATIVE mg/dL
Specific Gravity, Urine: 1.025 (ref 1.005–1.030)
Urobilinogen, UA: 0.2 mg/dL (ref 0.0–1.0)

## 2016-06-11 MED ORDER — CYCLOBENZAPRINE HCL 10 MG PO TABS: 10.0000 mg | ORAL_TABLET | Freq: Two times a day (BID) | ORAL | 0 refills | Status: DC | PRN

## 2016-06-11 MED ORDER — KETOROLAC TROMETHAMINE 60 MG/2ML IM SOLN
60.0000 mg | Freq: Once | INTRAMUSCULAR | Status: AC
  Administered 2016-06-11: 60 mg via INTRAMUSCULAR

## 2016-06-11 MED ORDER — KETOROLAC TROMETHAMINE 60 MG/2ML IM SOLN
INTRAMUSCULAR | Status: AC
  Filled 2016-06-11: qty 2

## 2016-06-11 MED ORDER — NAPROXEN 375 MG PO TABS: 375.0000 mg | ORAL_TABLET | Freq: Two times a day (BID) | ORAL | 0 refills | Status: DC

## 2016-06-11 NOTE — ED Provider Notes (Signed)
CSN: [id number]     Arrival date & time 06/11/16  1840 History   First MD Initiated Contact with Patient 06/11/16 1923     Chief Complaint  Patient presents with  . Back Pain   (Consider location/radiation/quality/duration/timing/severity/associated sxs/prior Treatment) The history is provided by the patient.  Back Pain  Location:  Lumbar spine Quality:  Aching Radiates to: radiating to left flank. Pain severity:  Moderate Pain is:  Worse during the day (pain worse laying to left side, prolong sitting., bending ) Onset quality:  Gradual Duration:  3 days Timing:  Constant Progression:  Worsening Worsened by:  Sitting, movement and bending : 51 yo female presented with CC of lower back pain radiating to left flank. Denies fever/chills. Denies urinary Sx. Denies blood in urine.   Past Medical History:  Diagnosis Date  . 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 Fibroids   . Gall stones   . Hypertension   . No pertinent past medical history   . Obesity    Past Surgical History:  Procedure Laterality Date  . ABDOMINAL HYSTERECTOMY  03/11/2011   Procedure: HYSTERECTOMY ABDOMINAL;  Surgeon: Juliene Pina C. Hulan Fray, MD;  Location: [facility];  Service: Gynecology;  Laterality: N/A;  Possible removal of tubes & ovaries  . Bilateral tubal ligation  1996  . CHOLECYSTECTOMY N/A 05/02/2015   Procedure: LAPAROSCOPIC CHOLECYSTECTOMY;  Surgeon: Ralene Ok, MD;  Location: [facility];  Service: General;  Laterality: N/A;  . SALPINGOOPHORECTOMY  03/11/2011   Procedure: SALPINGO OOPHERECTOMY;  Surgeon: Wilhemina Cash. Hulan Fray, MD;  Location: [facility];  Service: Gynecology;  Laterality: Bilateral;  . SVD     x 3   Family History  Problem Relation Age of Onset  . Diabetes type II Mother     Not currently on insulin but had been at one time  . Hypertension Mother   . Hyperlipidemia Mother   . Hypertension Father   . Hyperlipidemia Father   . Prostate cancer Father 12    s/p radiation  . Stroke Father  13  . Coronary artery disease Mother     s/p stents  . Fibroids Sister     s/p fibroidectomy 2/2 dub   Social History  Substance Use Topics  . Smoking status: Former Smoker    Packs/day: 0.30    Years: 35.00    Types: Cigarettes    Quit date: 06/06/2010  . Smokeless tobacco: Never Used  . Alcohol use No   OB History    Gravida Para Term Preterm AB Living   4 3 3  0 1     SAB TAB Ectopic Multiple Live Births   1             Review of Systems  Constitutional: Negative.   HENT: Negative.   Eyes: Negative.   Respiratory: Negative.   Cardiovascular: Negative.   Gastrointestinal: Negative.   Genitourinary: Negative.   Musculoskeletal: Positive for back pain (radiates toleft side).    Allergies  Patient has no known allergies.  Home Medications   Prior to Admission medications   Medication Sig Start Date End Date Taking? Authorizing Provider  lisinopril-hydrochlorothiazide (PRINZIDE,ZESTORETIC) 10-12.5 MG per tablet Take 1 tablet by mouth daily. 09/19/14  Yes Historical Provider, MD  cyclobenzaprine (FLEXERIL) 10 MG tablet Take 1 tablet (10 mg total) by mouth 2 (two) times daily as needed for muscle spasms. 06/11/16   Latiya Navia, NP  naproxen (NAPROSYN) 375 MG tablet Take 1 tablet (375 mg  total) by mouth 2 (two) times daily. 06/11/16   Braxley Balandran, NP   Meds Ordered and Administered this Visit   Medications  ketorolac (TORADOL) injection 60 mg (60 mg Intramuscular Given 06/11/16 2008)    BP 122/79 (BP Location: Right Arm)   Pulse 82   Temp 98 F (36.7 C) (Oral)   Resp 16   LMP 03/05/2011   SpO2 100%  No data found.   Physical Exam  Constitutional: She is oriented to person, place, and time. She appears well-developed and well-nourished. No distress.  HENT:  Head: Normocephalic.  Eyes: Pupils are equal, round, and reactive to light.  Cardiovascular: Normal rate, regular rhythm and normal heart sounds.   Pulmonary/Chest: Effort normal and breath sounds  normal.  Abdominal: Soft. Bowel sounds are normal. She exhibits no distension. There is tenderness (pain radiates from lower back to left mid axillary line between 5-7 rib).  Musculoskeletal: She exhibits tenderness (Tenderness to palaption at L3-L5. Paraspinous tenderness to palaption.  Straight leg rasie test positive on right. ). She exhibits no edema.  Neurological: She is alert and oriented to person, place, and time.  Skin: Skin is warm.  Psychiatric: She has a normal mood and affect.  -Ve CVA tenderness B/L   Urgent Care Course     Procedures (including critical care time)  Labs Review Labs Reviewed  POCT URINALYSIS DIP (DEVICE) - Abnormal; Notable for the following:       Result Value   Ketones, ur TRACE (*)    All other components within normal limits    Imaging Review No results found.   Visual Acuity Review  Right Eye Distance:   Left Eye Distance:   Bilateral Distance:    Right Eye Near:   Left Eye Near:    Bilateral Near:         MDM   1. Acute midline low back pain without sciatica   2. Muscle spasm   3. Strain of lumbar region, initial encounter   Dipstick negative for Hematuria. Sx likely from musculoskeletal pain. Bending , stretching exercises as tolerated. Heating pad to lower back as advised.     Sharada Albornoz, NP 06/11/16 2027

## 2016-06-11 NOTE — Discharge Instructions (Signed)
Bending , stretching exercises as tolerated. Heating pad to lower back as advised.

## 2016-06-11 NOTE — ED Triage Notes (Signed)
The patient presented to the [facility] with a complaint of lower left side back pain that radiates into her left side x 3 days. The patient denied any known injury.

## 2016-06-16 ENCOUNTER — Emergency Department (HOSPITAL_COMMUNITY): Payer: BLUE CROSS/BLUE SHIELD

## 2016-06-16 ENCOUNTER — Emergency Department (HOSPITAL_COMMUNITY)
Admission: EM | Admit: 2016-06-16 | Discharge: 2016-06-16 | Disposition: A | Discharge status: Home / Self Care | Payer: BLUE CROSS/BLUE SHIELD | Attending: Emergency Medicine | Admitting: Emergency Medicine

## 2016-06-16 ENCOUNTER — Encounter (HOSPITAL_COMMUNITY): Payer: Self-pay | Admitting: Emergency Medicine

## 2016-06-16 DIAGNOSIS — Z87891 Personal history of nicotine dependence: Secondary | ICD-10-CM | POA: Diagnosis not present

## 2016-06-16 DIAGNOSIS — R1032 Left lower quadrant pain: Secondary | ICD-10-CM | POA: Diagnosis present

## 2016-06-16 DIAGNOSIS — I10 Essential (primary) hypertension: Secondary | ICD-10-CM | POA: Diagnosis not present

## 2016-06-16 DIAGNOSIS — Z79899 Other long term (current) drug therapy: Secondary | ICD-10-CM | POA: Diagnosis not present

## 2016-06-16 DIAGNOSIS — L259 Unspecified contact dermatitis, unspecified cause: Secondary | ICD-10-CM | POA: Insufficient documentation

## 2016-06-16 DIAGNOSIS — L309 Dermatitis, unspecified: Secondary | ICD-10-CM

## 2016-06-16 DIAGNOSIS — K5909 Other constipation: Secondary | ICD-10-CM

## 2016-06-16 LAB — URINALYSIS, ROUTINE W REFLEX MICROSCOPIC
Bilirubin Urine: NEGATIVE
Glucose, UA: NEGATIVE mg/dL
HGB URINE DIPSTICK: NEGATIVE
Ketones, ur: NEGATIVE mg/dL
Leukocytes, UA: NEGATIVE
NITRITE: NEGATIVE
PROTEIN: NEGATIVE mg/dL
SPECIFIC GRAVITY, URINE: 1.024 (ref 1.005–1.030)
pH: 5 (ref 5.0–8.0)

## 2016-06-16 LAB — CBC
HCT: 35.2 % — ABNORMAL LOW (ref 36.0–46.0)
HEMOGLOBIN: 11.5 g/dL — AB (ref 12.0–15.0)
MCH: 26.9 pg (ref 26.0–34.0)
MCHC: 32.7 g/dL (ref 30.0–36.0)
MCV: 82.4 fL (ref 78.0–100.0)
PLATELETS: 238 10*3/uL (ref 150–400)
RBC: 4.27 MIL/uL (ref 3.87–5.11)
RDW: 13.4 % (ref 11.5–15.5)
WBC: 6.9 10*3/uL (ref 4.0–10.5)

## 2016-06-16 LAB — COMPREHENSIVE METABOLIC PANEL
ALK PHOS: 57 U/L (ref 38–126)
ALT: 22 U/L (ref 14–54)
AST: 26 U/L (ref 15–41)
Albumin: 3.7 g/dL (ref 3.5–5.0)
Anion gap: 6 (ref 5–15)
BILIRUBIN TOTAL: 0.7 mg/dL (ref 0.3–1.2)
BUN: 12 mg/dL (ref 6–20)
CALCIUM: 9 mg/dL (ref 8.9–10.3)
CO2: 27 mmol/L (ref 22–32)
CREATININE: 0.83 mg/dL (ref 0.44–1.00)
Chloride: 105 mmol/L (ref 101–111)
Glucose, Bld: 141 mg/dL — ABNORMAL HIGH (ref 65–99)
Potassium: 3.8 mmol/L (ref 3.5–5.1)
Sodium: 138 mmol/L (ref 135–145)
TOTAL PROTEIN: 6.5 g/dL (ref 6.5–8.1)

## 2016-06-16 LAB — LIPASE, BLOOD: Lipase: 23 U/L (ref 11–51)

## 2016-06-16 MED ORDER — BISACODYL 5 MG PO TBEC: 10.0000 mg | DELAYED_RELEASE_TABLET | Freq: Every day | ORAL | 0 refills | Status: DC | PRN

## 2016-06-16 MED ORDER — POLYETHYLENE GLYCOL 3350 17 G PO PACK: 17.0000 g | PACK | Freq: Every day | ORAL | 0 refills | Status: DC

## 2016-06-16 MED ORDER — GI COCKTAIL ~~LOC~~
30.0000 mL | Freq: Once | ORAL | Status: AC
  Administered 2016-06-16: 30 mL via ORAL
  Filled 2016-06-16: qty 30

## 2016-06-16 MED ORDER — KETOROLAC TROMETHAMINE 30 MG/ML IJ SOLN
30.0000 mg | Freq: Once | INTRAMUSCULAR | Status: AC
  Administered 2016-06-16: 30 mg via INTRAMUSCULAR
  Filled 2016-06-16: qty 1

## 2016-06-16 MED ORDER — HYDROCORTISONE 2.5 % EX LOTN: TOPICAL_LOTION | Freq: Two times a day (BID) | CUTANEOUS | 0 refills | Status: DC

## 2016-06-16 NOTE — Discharge Instructions (Signed)
Begin using hydrocortisone cream on affected areas. Take Dulcolax or MiraLAX as needed for constipation. Follow-up with PCP for further evaluation. Return to ED for worsening pain, blood in stool, blood in vomit, vision changes, injuries, persistent abdominal pain, trouble breathing.

## 2016-06-16 NOTE — ED Provider Notes (Signed)
[place] DEPT Provider Note   CSN: [id number] Arrival date & time: 06/16/16  1358     History   Chief Complaint Chief Complaint  Patient presents with  . Abdominal Pain  . Flank Pain    HPI Kimberly Anderson is a 51 y.o. female.  Patient presents with one-week history of left-sided mid abdominal pain that radiates to her left lower back. She denies nausea, vomiting, diarrhea, constipation, blood in stool. She states she has had a normal appetite. She states she was seen at urgent care for back pain and given muscle relaxer. She states that this has not helped with this pain. Patient is also complaining of small bump in the groin area. She states she has had similar to this in other parts of her body that had spontaneously resolved and rarely needed drainage. She also complains of rash on the left side of her abdomen, and a different area than where her pain is associated, that began a week ago also. She has never had a rash similar to this in the past. She denies any new soaps, lotions, fabrics, detergent. Of note patient's past abdominal surgical history includes cholecystectomy, tubal ligation, hysterectomy. Denies chest pain, trouble breathing, new medications, recent travel, changes in diet.      Past Medical History:  Diagnosis Date  . 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 Fibroids   . Gall stones   . Hypertension   . No pertinent past medical history   . Obesity     Patient Active Problem List   Diagnosis Date Noted  . Acute cholecystitis 05/02/2015  . Abdominal pain 02/28/2012  . Hot flashes due to surgical menopause 11/06/2011  . Leg pain, bilateral 11/06/2011  . Iron deficiency anemia 11/06/2011  . Cervical strain 11/27/2010  . Preventative health care 11/06/2010    Past Surgical History:  Procedure Laterality Date  . ABDOMINAL HYSTERECTOMY  03/11/2011   Procedure: HYSTERECTOMY ABDOMINAL;  Surgeon: Juliene Pina C. Hulan Fray, MD;  Location:  [facility];  Service: Gynecology;  Laterality: N/A;  Possible removal of tubes & ovaries  . Bilateral tubal ligation  1996  . CHOLECYSTECTOMY N/A 05/02/2015   Procedure: LAPAROSCOPIC CHOLECYSTECTOMY;  Surgeon: Ralene Ok, MD;  Location: [facility];  Service: General;  Laterality: N/A;  . SALPINGOOPHORECTOMY  03/11/2011   Procedure: SALPINGO OOPHERECTOMY;  Surgeon: Wilhemina Cash. Hulan Fray, MD;  Location: [facility];  Service: Gynecology;  Laterality: Bilateral;  . SVD     x 3    OB History    Gravida Para Term Preterm AB Living   4 3 3  0 1     SAB TAB Ectopic Multiple Live Births   1               Home Medications    Prior to Admission medications   Medication Sig Start Date End Date Taking? Authorizing Provider  acetaminophen-codeine (TYLENOL #3) 300-30 MG tablet Take 1 tablet by mouth every 6 (six) hours as needed for moderate pain.   Yes Historical Provider, MD  Ascorbic Acid (VITAMIN C PO) Take 1 tablet by mouth daily.   Yes Historical Provider, MD  cyclobenzaprine (FLEXERIL) 10 MG tablet Take 1 tablet (10 mg total) by mouth 2 (two) times daily as needed for muscle spasms. 06/11/16  Yes Bhupinder Multani, NP  lisinopril-hydrochlorothiazide (PRINZIDE,ZESTORETIC) 10-12.5 MG per tablet Take 1 tablet by mouth daily. 09/19/14  Yes Historical Provider, MD  Multiple Vitamin (MULTIVITAMIN) capsule Take 1 capsule by mouth  daily.   Yes Historical Provider, MD  naproxen (NAPROSYN) 375 MG tablet Take 1 tablet (375 mg total) by mouth 2 (two) times daily. Patient taking differently: Take 375 mg by mouth 2 (two) times daily as needed for moderate pain.  06/11/16  Yes Bhupinder Multani, NP  bisacodyl (DULCOLAX) 5 MG EC tablet Take 2 tablets (10 mg total) by mouth daily as needed for moderate constipation. 06/16/16   Flynn Lininger, PA-C  hydrocortisone 2.5 % lotion Apply topically 2 (two) times daily. 06/16/16   Berl Bonfanti, PA-C  polyethylene glycol (MIRALAX) packet Take 17 g by mouth daily. 06/16/16   Delia Heady, PA-C     Family History Family History  Problem Relation Age of Onset  . Diabetes type II Mother     Not currently on insulin but had been at one time  . Hypertension Mother   . Hyperlipidemia Mother   . Coronary artery disease Mother     s/p stents  . Hypertension Father   . Hyperlipidemia Father   . Prostate cancer Father 54    s/p radiation  . Stroke Father 16  . Fibroids Sister     s/p fibroidectomy 2/2 dub    Social History Social History  Substance Use Topics  . Smoking status: Former Smoker    Packs/day: 0.30    Years: 35.00    Types: Cigarettes    Quit date: 06/06/2010  . Smokeless tobacco: Never Used  . Alcohol use No     Allergies   Patient has no known allergies.   Review of Systems Review of Systems  Constitutional: Negative for appetite change, chills and fever.  HENT: Negative for ear pain, rhinorrhea, sneezing and sore throat.   Eyes: Negative for photophobia and visual disturbance.  Respiratory: Negative for cough, chest tightness, shortness of breath and wheezing.   Cardiovascular: Negative for chest pain and palpitations.  Gastrointestinal: Positive for abdominal pain. Negative for blood in stool, constipation, diarrhea, nausea and vomiting.  Genitourinary: Negative for dysuria, flank pain, hematuria and urgency.  Musculoskeletal: Positive for back pain. Negative for myalgias.  Skin: Positive for rash.  Neurological: Negative for dizziness, weakness and light-headedness.     Physical Exam Updated Vital Signs BP 118/81   Pulse 71   Temp 98.3 F (36.8 C) (Oral)   Resp 20   Ht 5\' 9"  (1.753 m)   Wt 102.1 kg   LMP 03/05/2011   SpO2 100%   BMI 33.23 kg/m   Physical Exam  Constitutional: She appears well-developed and well-nourished. No distress.  HENT:  Head: Normocephalic and atraumatic.  Nose: Nose normal.  Eyes: Conjunctivae and EOM are normal. Right eye exhibits no discharge. Left eye exhibits no discharge. No scleral icterus.  Neck:  Normal range of motion. Neck supple.  Cardiovascular: Normal rate, regular rhythm, normal heart sounds and intact distal pulses.  Exam reveals no gallop and no friction rub.   No murmur heard. Pulmonary/Chest: Effort normal and breath sounds normal. No respiratory distress.  Abdominal: Soft. Bowel sounds are normal. She exhibits no distension. There is tenderness (On L side of mid abdomen.). There is no rebound and no guarding.  Negative left-sided CVA tenderness.  Musculoskeletal: Normal range of motion. She exhibits no edema.  Tenderness to palpation of the left lower back paraspinally.  Neurological: She is alert. She exhibits normal muscle tone. Coordination normal.  Skin: Skin is warm and dry. Rash noted.  There is a 1 cm area of induration on the right groin area.  No temperature changes, tenderness, color changes. Patient also has a rash on the lower abdomen with lesions that are nonpainful and not itchy. Lesions stop at midline.  Psychiatric: She has a normal mood and affect.  Nursing note and vitals reviewed.    ED Treatments / Results  Labs (all labs ordered are listed, but only abnormal results are displayed) Labs Reviewed  COMPREHENSIVE METABOLIC PANEL - Abnormal; Notable for the following:       Result Value   Glucose, Bld 141 (*)    All other components within normal limits  CBC - Abnormal; Notable for the following:    Hemoglobin 11.5 (*)    HCT 35.2 (*)    All other components within normal limits  URINALYSIS, ROUTINE W REFLEX MICROSCOPIC - Abnormal; Notable for the following:    APPearance HAZY (*)    All other components within normal limits  LIPASE, BLOOD    EKG  EKG Interpretation None       Radiology Dg Abdomen 1 View  Result Date: 06/16/2016 CLINICAL DATA:  Abdominal pain EXAM: ABDOMEN - 1 VIEW COMPARISON:  02/25/2012 CT abdomen/pelvis FINDINGS: No disproportionately dilated small bowel loops. Mild-to-moderate colorectal stool volume. No evidence of  pneumatosis pneumoperitoneum. No radiopaque urolithiasis. IMPRESSION: Nonobstructive bowel gas pattern. Mild-to-moderate colorectal stool volume. Electronically Signed   By: Ilona Sorrel M.D.   On: 06/16/2016 17:25    Procedures Procedures (including critical care time)  Medications Ordered in ED Medications  gi cocktail (Maalox,Lidocaine,Donnatal) (30 mLs Oral Given 06/16/16 1736)  ketorolac (TORADOL) 30 MG/ML injection 30 mg (30 mg Intramuscular Given 06/16/16 1736)     Initial Impression / Assessment and Plan / ED Course  I have reviewed the triage vital signs and the nursing notes.  Pertinent labs & imaging results that were available during my care of the patient were reviewed by me and considered in my medical decision making (see chart for details).     Patient's history and symptoms concerning for musculoskeletal pain versus gastroenteritis versus constipation versus UTI versus pancreatitis versus shingles versus dermatitis. CBC, CMP, lipase, UA returned as unremarkable. Patient is afebrile with normal vital signs. She does not appear to be in acute distress. Lesions with low likelihood of shingles due to absence of pain and appearance of lesions. KUB shows no acute abdominal processes just mild to moderate stool burden. Given GI cocktail and Toradol here for symptomatic control. Patient states relief of symptoms with the above measures. Symptoms likely due to constipation. Will give hydrocortisone cream for rash which is likely dermatitis. Will give MiraLAX and Dulcolax to be taken as needed for constipation. Patient states that she would like both said that she can see which one works better for her. Return precautions given. Follow-up with dermatology for rash.   Final Clinical Impressions(s) / ED Diagnoses   Final diagnoses:  Other constipation  Dermatitis    New Prescriptions New Prescriptions   BISACODYL (DULCOLAX) 5 MG EC TABLET    Take 2 tablets (10 mg total) by  mouth daily as needed for moderate constipation.   HYDROCORTISONE 2.5 % LOTION    Apply topically 2 (two) times daily.   POLYETHYLENE GLYCOL (MIRALAX) PACKET    Take 17 g by mouth daily.     Delia Heady, PA-C 06/16/16 1825    Carmin Muskrat, MD 06/20/16 2027

## 2016-06-16 NOTE — ED Notes (Signed)
Pt to xray

## 2016-06-16 NOTE — ED Triage Notes (Signed)
Pt c/o mid to left sided abdominal pain that radiates to left back. Pt reports rash to abdomen and abscess to right groin area. Symptoms ongoing x 1 week. Pt denies N/V.

## 2016-08-19 ENCOUNTER — Ambulatory Visit: Payer: Self-pay | Admitting: Podiatry

## 2016-09-11 ENCOUNTER — Encounter: Payer: Self-pay | Admitting: Podiatry

## 2016-09-11 ENCOUNTER — Ambulatory Visit (INDEPENDENT_AMBULATORY_CARE_PROVIDER_SITE_OTHER): Payer: BLUE CROSS/BLUE SHIELD | Admitting: Podiatry

## 2016-09-11 DIAGNOSIS — M79671 Pain in right foot: Secondary | ICD-10-CM

## 2016-09-11 DIAGNOSIS — M79672 Pain in left foot: Secondary | ICD-10-CM

## 2016-09-11 DIAGNOSIS — M216X2 Other acquired deformities of left foot: Secondary | ICD-10-CM

## 2016-09-11 DIAGNOSIS — M21969 Unspecified acquired deformity of unspecified lower leg: Secondary | ICD-10-CM

## 2016-09-11 DIAGNOSIS — M216X1 Other acquired deformities of right foot: Secondary | ICD-10-CM

## 2016-09-11 MED ORDER — NABUMETONE 500 MG PO TABS: 500.0000 mg | ORAL_TABLET | Freq: Two times a day (BID) | ORAL | 1 refills | Status: DC

## 2016-09-11 MED ORDER — OXYCODONE-ACETAMINOPHEN 7.5-325 MG PO TABS
1.0000 | ORAL_TABLET | Freq: Four times a day (QID) | ORAL | 0 refills | Status: DC | PRN
Start: 2016-09-11 — End: 2020-05-02

## 2016-09-11 NOTE — Progress Notes (Signed)
SUBJECTIVE: 51 y.o.year old femalepresents complaining of pain in both feet top and bottom arch, and heel x over 2 months. Been wearing orthotics for the past 2 months.  Also having painful callus under the 5th MPJ area L>R. On feet all day 6-8 hours at work.  REVIEW OF SYSTEMS: A comprehensive review of systems was negative except for: Hypertension and gall bladder surgery in 2017.   OBJECTIVE: DERMATOLOGIC EXAMINATION: Thick dystrophic and discolored right great toe nail at distal 1/2 of plate. Left great toe is mildly affected.  VASCULAR EXAMINATION OF LOWER LIMBS: All pedal pulses are palpable with normal pulsation.  Capillary Filling times within 3 seconds in all digits.  No edema or erythema noted. Temperature gradient from tibial crest to dorsum of foot is within normal bilateral.  NEUROLOGIC EXAMINATION OF THE LOWER LIMBS: All epicritic and tactile sensations grossly intact. Sharp and Dull discriminatory sensations at the plantar ball of hallux is intact bilateral.   MUSCULOSKELETAL EXAMINATION: Positive for tight Achilles tendon bilateral. Elevated first ray bilateral with bunion deformity on right. STJ hyperpronation with weight bearing bilateral.  ASSESSMENT: Onychomycosis right great toe. Ankle equinus bilateral. 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 STJ hyperpronation bilateral.  PLAN: Reviewed clinical findings and available treatment options. Stretch exercise reviewed to practice daily. Patient has not made improvement since her last visit and will require outside help.  Metatarsal binder dispensed with instruction x 1 pair. Ankle brace dispensed x 2.  Plantar callus under 5th MPJ left debrided.  Rx. Relafen, Percocet done. Referral for PT to stretch Achilles tendon done.

## 2016-09-11 NOTE — Patient Instructions (Signed)
Seen for pain in both feet for over 2 months. Wearing Orthotics and still hurts with pain and swelling on ankle as well. Rx for Relafen and Percocet done. Referral for PT for tight Achilles tendon done. Ankle brace dispensed with instruction x 2. Return as needed.

## 2016-10-08 ENCOUNTER — Ambulatory Visit: Payer: BLUE CROSS/BLUE SHIELD | Attending: Podiatry

## 2016-10-22 ENCOUNTER — Other Ambulatory Visit: Payer: Self-pay | Admitting: Internal Medicine

## 2016-10-22 DIAGNOSIS — Z1231 Encounter for screening mammogram for malignant neoplasm of breast: Secondary | ICD-10-CM

## 2016-11-01 ENCOUNTER — Ambulatory Visit
Admission: RE | Admit: 2016-11-01 | Discharge: 2016-11-01 | Disposition: A | Discharge status: Home / Self Care | Payer: BLUE CROSS/BLUE SHIELD | Source: Ambulatory Visit | Attending: Internal Medicine | Admitting: Internal Medicine

## 2016-11-01 DIAGNOSIS — Z1231 Encounter for screening mammogram for malignant neoplasm of breast: Secondary | ICD-10-CM

## 2016-11-30 ENCOUNTER — Other Ambulatory Visit: Payer: Self-pay | Admitting: Podiatry

## 2017-02-11 ENCOUNTER — Ambulatory Visit: Payer: BLUE CROSS/BLUE SHIELD | Admitting: Podiatry

## 2017-02-23 IMAGING — MG MM SCREEN MAMMOGRAM BILATERAL
4 series · 4 of 4 positions shown · non-contrast
Comparison: Previous exam(s).

CLINICAL DATA: Screening.

EXAM:
DIGITAL SCREENING BILATERAL MAMMOGRAM WITH CAD

[R CC]
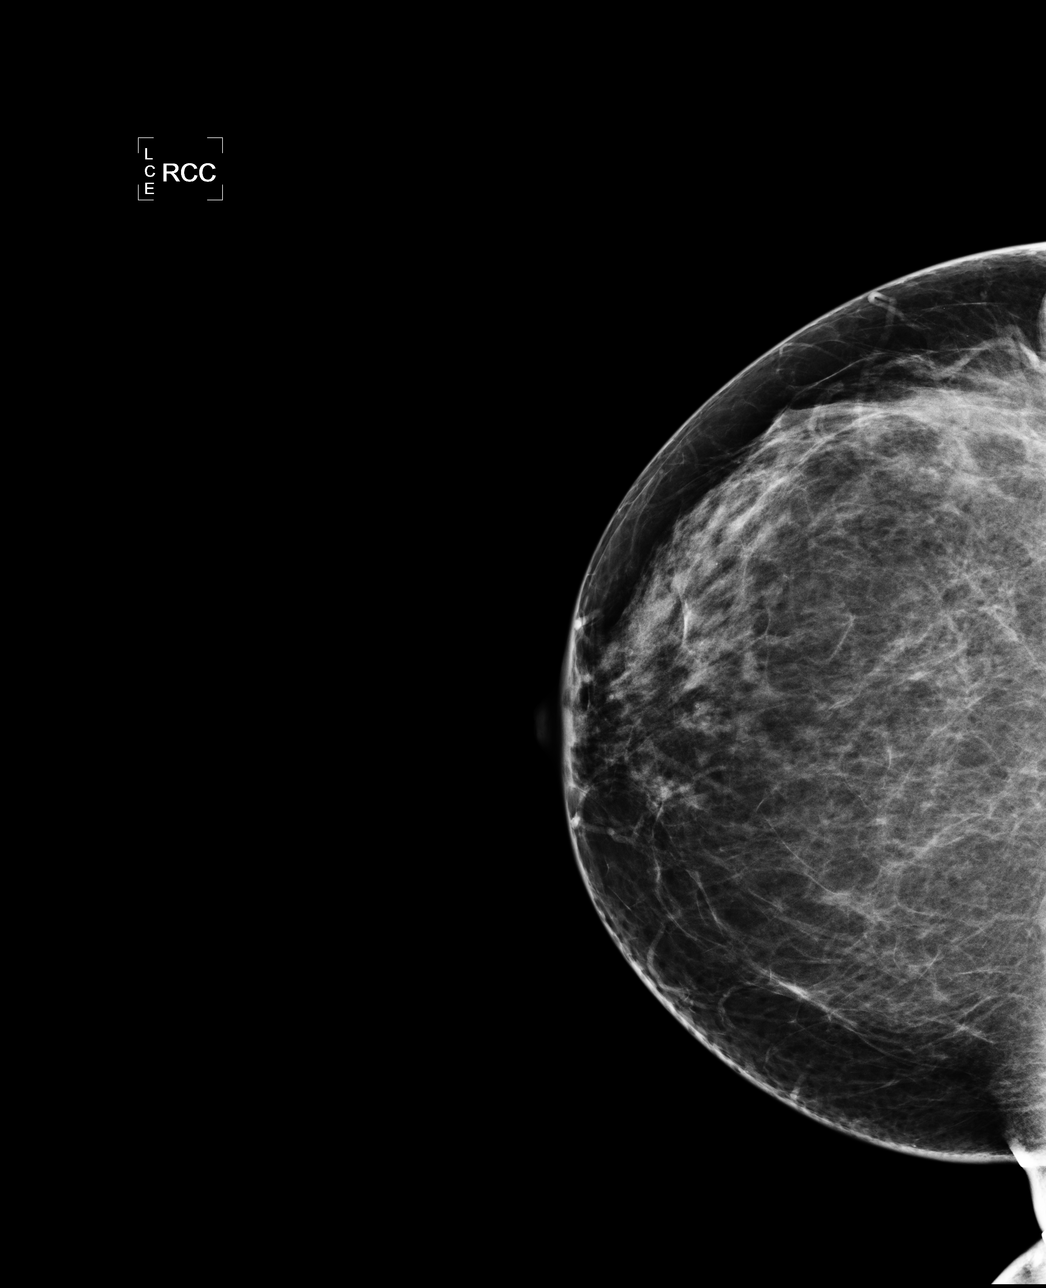

[L CC]
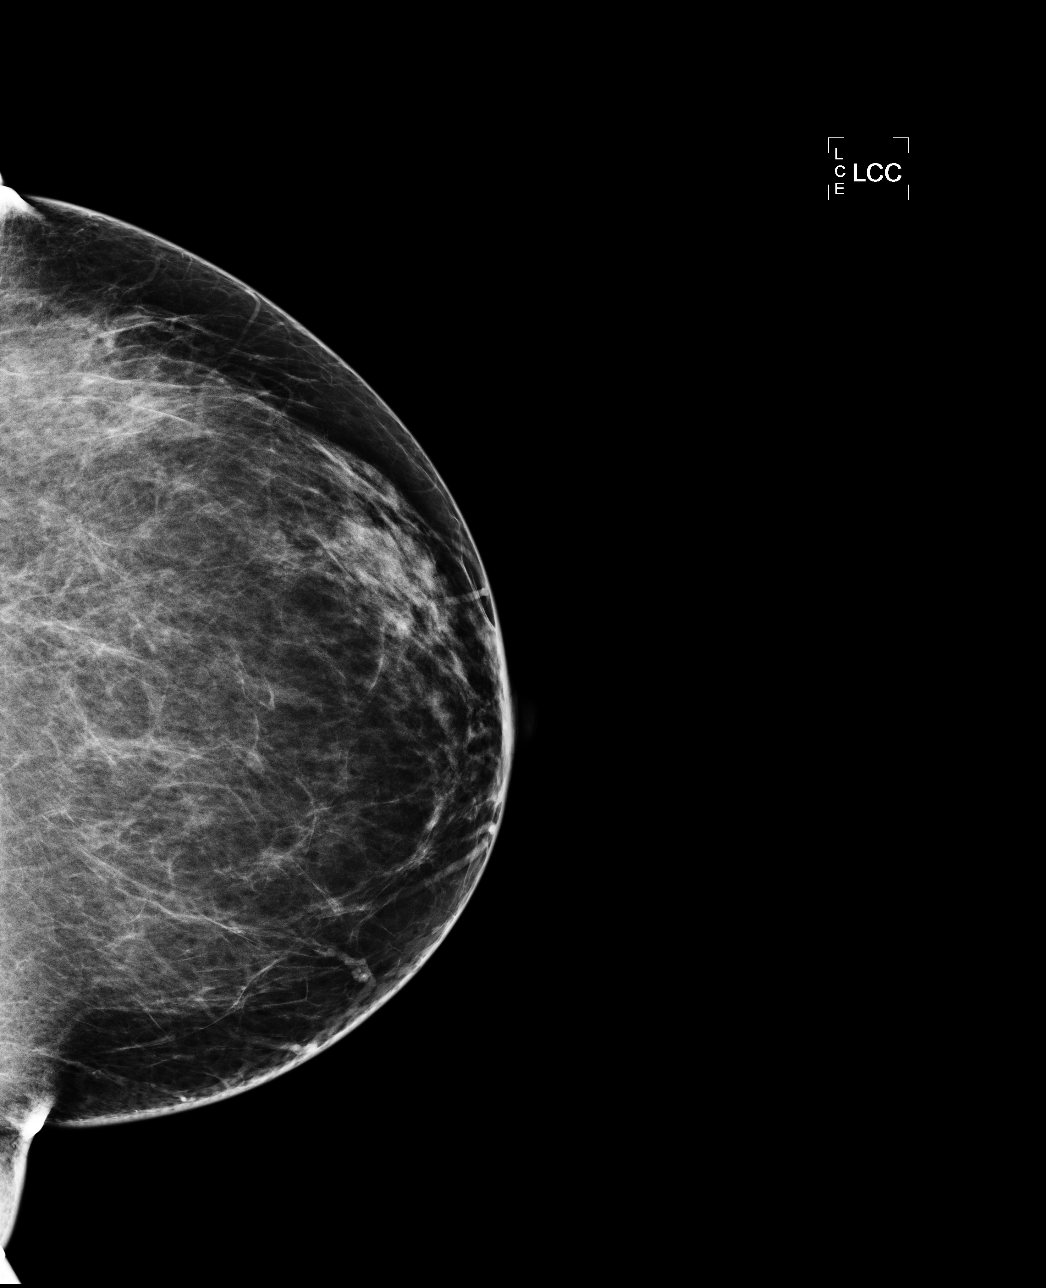

[L MLO]
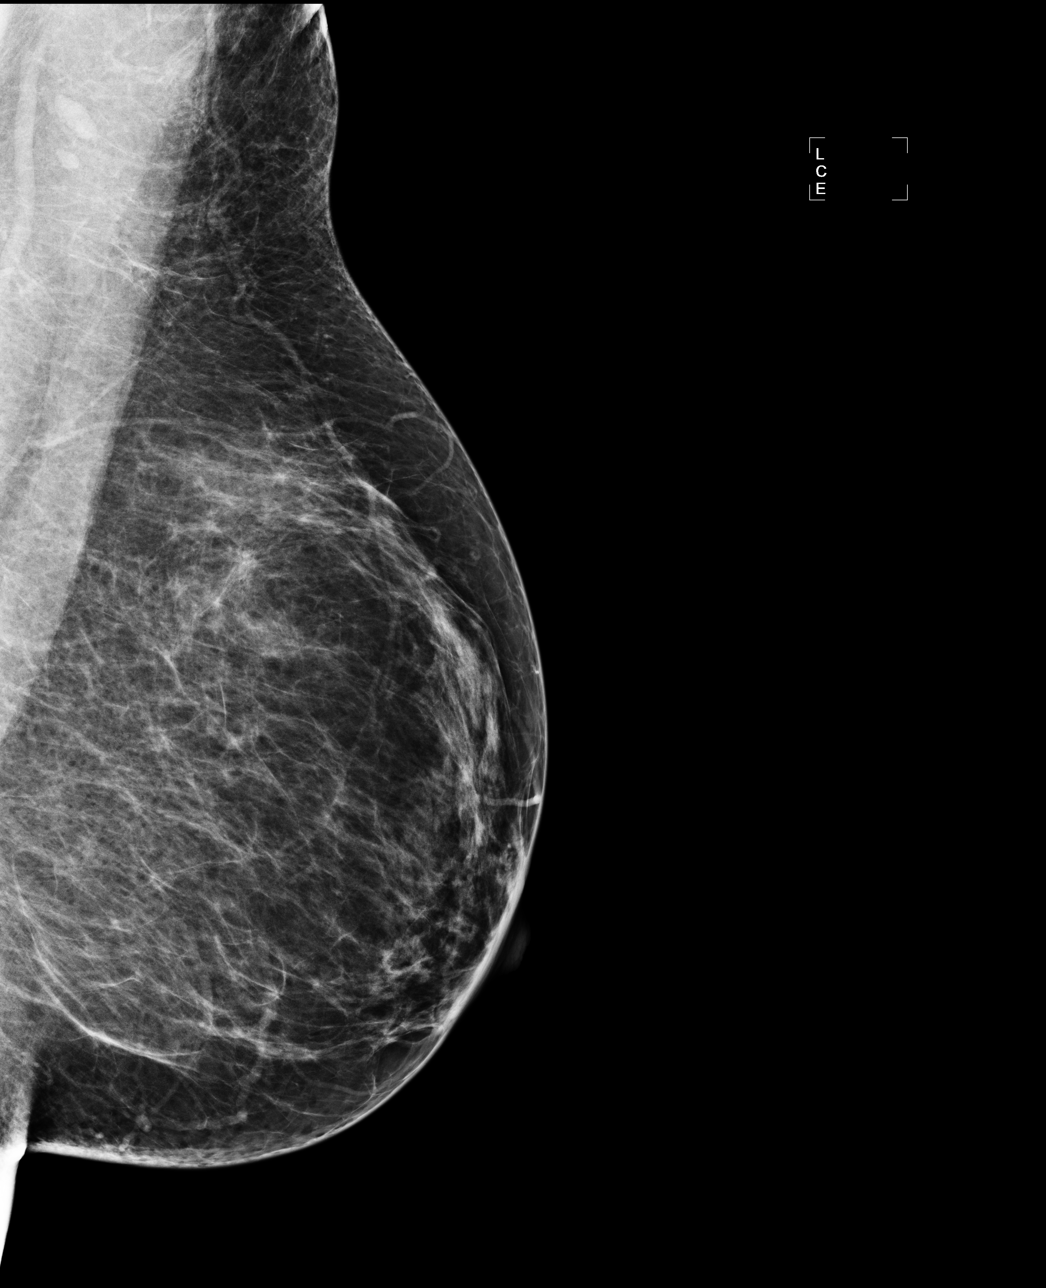

[R MLO]
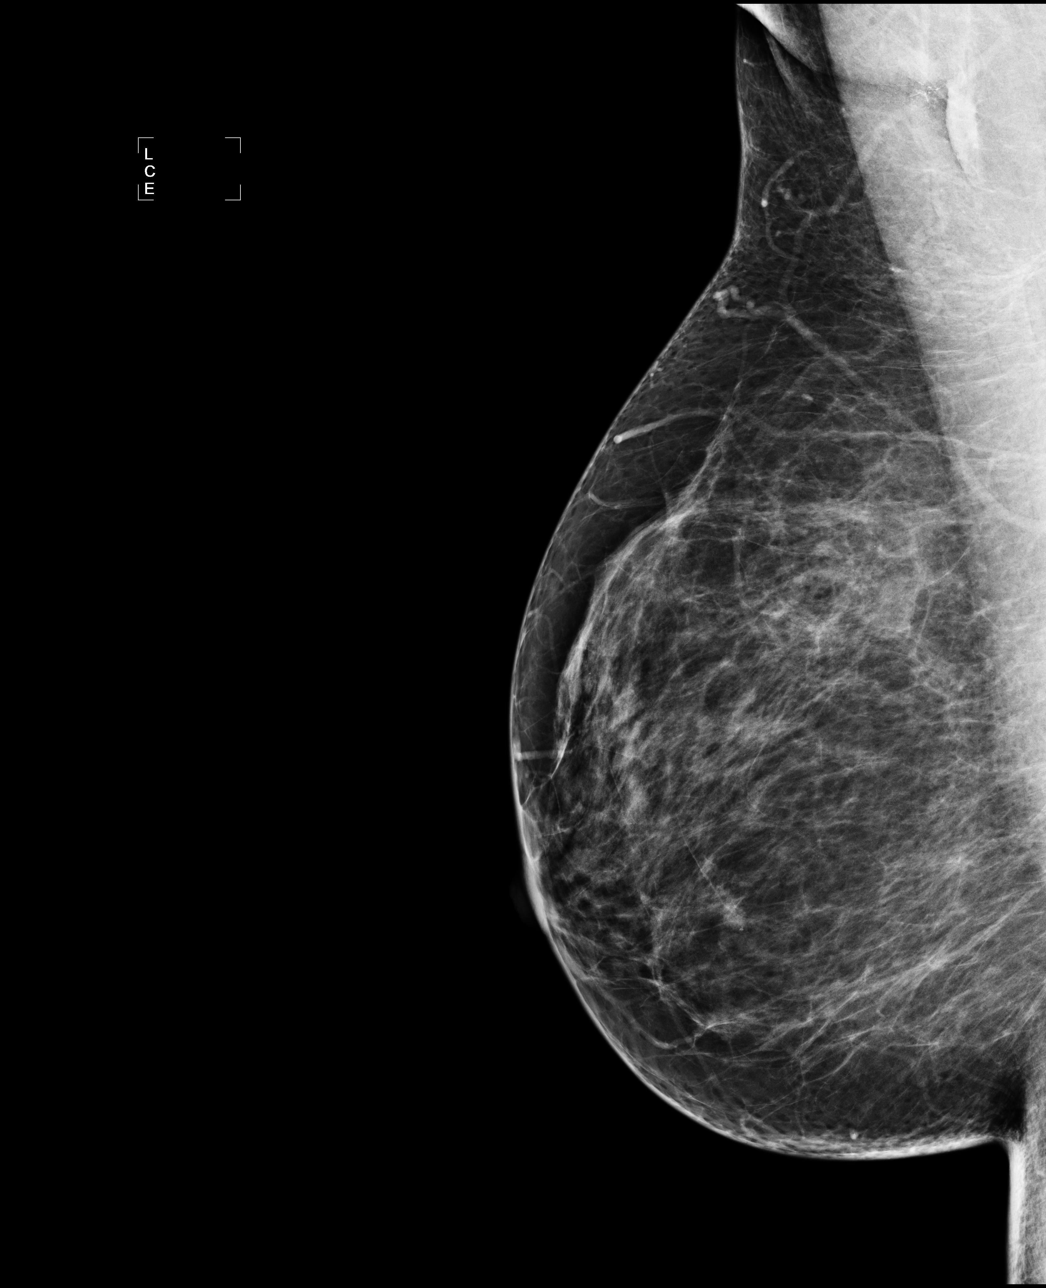

[4 of 4 positions shown; findings below may reference images not displayed]

ACR Breast Density Category b: There are scattered areas of
fibroglandular density.
FINDINGS: There are no findings suspicious for malignancy. Images were
processed with CAD.
IMPRESSION: No mammographic evidence of malignancy. A result letter of this
screening mammogram will be mailed directly to the patient.

RECOMMENDATION:
Screening mammogram in one year. (Code:AS-G-LCT)

BI-RADS CATEGORY  1: Negative.

## 2017-04-09 ENCOUNTER — Ambulatory Visit: Payer: BLUE CROSS/BLUE SHIELD | Admitting: Podiatry

## 2017-04-17 ENCOUNTER — Ambulatory Visit: Payer: BLUE CROSS/BLUE SHIELD | Admitting: Podiatry

## 2017-05-08 ENCOUNTER — Ambulatory Visit: Payer: BLUE CROSS/BLUE SHIELD | Admitting: Obstetrics and Gynecology

## 2017-05-21 ENCOUNTER — Ambulatory Visit: Payer: BLUE CROSS/BLUE SHIELD | Admitting: Podiatry

## 2017-05-21 ENCOUNTER — Other Ambulatory Visit: Payer: Self-pay | Admitting: Podiatry

## 2017-05-21 ENCOUNTER — Ambulatory Visit (INDEPENDENT_AMBULATORY_CARE_PROVIDER_SITE_OTHER): Payer: BLUE CROSS/BLUE SHIELD

## 2017-05-21 ENCOUNTER — Encounter: Payer: Self-pay | Admitting: Podiatry

## 2017-05-21 VITALS — BP 149/86 | HR 93

## 2017-05-21 DIAGNOSIS — B351 Tinea unguium: Secondary | ICD-10-CM | POA: Diagnosis not present

## 2017-05-21 DIAGNOSIS — M722 Plantar fascial fibromatosis: Secondary | ICD-10-CM

## 2017-05-21 DIAGNOSIS — R52 Pain, unspecified: Secondary | ICD-10-CM

## 2017-05-21 MED ORDER — TRIAMCINOLONE ACETONIDE 10 MG/ML IJ SUSP
10.0000 mg | Freq: Once | INTRAMUSCULAR | Status: AC
Start: 2017-05-21 — End: 2017-05-21
  Administered 2017-05-21: 10 mg

## 2017-05-21 MED ORDER — PREDNISONE 10 MG PO TABS: ORAL_TABLET | ORAL | 0 refills | Status: DC

## 2017-05-21 NOTE — Progress Notes (Signed)
Subjective:   Patient ID: Kimberly Anderson, female   DOB: 52 y.o.   MRN: [id number]   HPI Patient presents stating she has had a lot of pain in her feet for your and has pain mostly in the heels but now the outside of the feet has started to hurt.  States the left hurts more than the right but they both get quite sore and this is been an ongoing event for the patient.  Patient does not smoke and likes to be active   Review of Systems  All other systems reviewed and are negative.       Objective:  Physical Exam  Constitutional: She appears well-developed and well-nourished.  Cardiovascular: Intact distal pulses.  Pulmonary/Chest: Effort normal.  Musculoskeletal: Normal range of motion.  Neurological: She is alert.  Skin: Skin is warm.  Nursing note and vitals reviewed.   Neurovascular status intact muscle strength adequate range of motion within normal limits with patient found to have exquisite discomfort plantar aspect heel left over right with inflammation fluid buildup and mild changes in gait with discomfort in the lateral side of the foot bilateral left over right.  Patient also has forefoot pain mild in its intensity but the heels at this time seem to be the worst area that she is experiencing.  Patient noted to have equinus condition bilateral     Assessment:  Acute plantar fasciitis left over right with probable change in gait leading to lateral tendinitis     Plan:  H&P conditions reviewed and today I did go ahead and administer plantar fascial injections at the insertion 3 mg Kenalog 5 mg Xylocaine and applied fascial brace bilateral and placed on Sterapred DS 12-day Dosepak to try to reduce inflammation and patient will be seen back again in 2 weeks.  X-rays indicate small spur with no indications of stress fracture arthritis with moderate depression of the arch noted

## 2017-06-12 ENCOUNTER — Ambulatory Visit: Payer: BLUE CROSS/BLUE SHIELD | Admitting: Obstetrics

## 2017-09-13 ENCOUNTER — Encounter

## 2017-10-03 ENCOUNTER — Emergency Department (HOSPITAL_COMMUNITY)
Admission: EM | Admit: 2017-10-03 | Discharge: 2017-10-03 | Discharge status: Against Medical Advice | Payer: BLUE CROSS/BLUE SHIELD

## 2017-10-03 NOTE — ED Notes (Signed)
Patient went to room 25 stated he was her son.

## 2017-11-18 ENCOUNTER — Other Ambulatory Visit: Payer: Self-pay

## 2017-11-18 ENCOUNTER — Encounter (HOSPITAL_COMMUNITY): Payer: Self-pay | Admitting: Emergency Medicine

## 2017-11-18 ENCOUNTER — Emergency Department (HOSPITAL_COMMUNITY)
Admission: EM | Admit: 2017-11-18 | Discharge: 2017-11-18 | Disposition: A | Discharge status: Home / Self Care | Payer: BLUE CROSS/BLUE SHIELD | Attending: Emergency Medicine | Admitting: Emergency Medicine

## 2017-11-18 DIAGNOSIS — Z87891 Personal history of nicotine dependence: Secondary | ICD-10-CM | POA: Diagnosis not present

## 2017-11-18 DIAGNOSIS — M79642 Pain in left hand: Secondary | ICD-10-CM | POA: Insufficient documentation

## 2017-11-18 DIAGNOSIS — M7918 Myalgia, other site: Secondary | ICD-10-CM

## 2017-11-18 DIAGNOSIS — I10 Essential (primary) hypertension: Secondary | ICD-10-CM | POA: Diagnosis not present

## 2017-11-18 MED ORDER — NAPROXEN 500 MG PO TABS: 500.0000 mg | ORAL_TABLET | Freq: Two times a day (BID) | ORAL | 0 refills | Status: DC

## 2017-11-18 MED ORDER — CYCLOBENZAPRINE HCL 10 MG PO TABS: 10.0000 mg | ORAL_TABLET | Freq: Two times a day (BID) | ORAL | 0 refills | Status: DC | PRN
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
## 2017-11-22 ENCOUNTER — Emergency Department (HOSPITAL_COMMUNITY)
Admission: EM | Admit: 2017-11-22 | Discharge: 2017-11-22 | Disposition: A | Discharge status: Home / Self Care | Payer: No Typology Code available for payment source | Attending: Emergency Medicine | Admitting: Emergency Medicine

## 2017-11-22 ENCOUNTER — Encounter (HOSPITAL_COMMUNITY): Payer: Self-pay | Admitting: Emergency Medicine

## 2017-11-22 ENCOUNTER — Other Ambulatory Visit: Payer: Self-pay

## 2017-11-22 DIAGNOSIS — M62838 Other muscle spasm: Secondary | ICD-10-CM | POA: Diagnosis not present

## 2017-11-22 DIAGNOSIS — Z87891 Personal history of nicotine dependence: Secondary | ICD-10-CM | POA: Insufficient documentation

## 2017-11-22 DIAGNOSIS — Z79899 Other long term (current) drug therapy: Secondary | ICD-10-CM | POA: Insufficient documentation

## 2017-11-22 DIAGNOSIS — M549 Dorsalgia, unspecified: Secondary | ICD-10-CM | POA: Diagnosis present

## 2017-11-22 MED ORDER — KETOROLAC TROMETHAMINE 15 MG/ML IJ SOLN
15.0000 mg | Freq: Once | INTRAMUSCULAR | Status: AC
  Administered 2017-11-22: 15 mg via INTRAMUSCULAR
  Filled 2017-11-22: qty 1

## 2017-11-22 MED ORDER — ORPHENADRINE CITRATE ER 100 MG PO TB12: 100.0000 mg | ORAL_TABLET | Freq: Two times a day (BID) | ORAL | 0 refills | Status: DC
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
## 2017-11-22 NOTE — ED Notes (Signed)
ED Provider at bedside. 
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
## 2017-11-22 NOTE — Discharge Instructions (Addendum)
Recheck with your family doctor, referral given if needed. Continue Flexeril, start Norflex. Compresses to sore muscles, take naproxen as previously prescribed. Soreness may continue for several days but should get progressively better.  Your primary doctor may need to refer you for physical therapy if needed.

## 2018-09-23 ENCOUNTER — Ambulatory Visit (HOSPITAL_COMMUNITY)
Admission: EM | Admit: 2018-09-23 | Discharge: 2018-09-23 | Disposition: A | Discharge status: Home / Self Care | Payer: Self-pay | Attending: Emergency Medicine | Admitting: Emergency Medicine

## 2018-09-23 ENCOUNTER — Other Ambulatory Visit: Payer: Self-pay

## 2018-09-23 ENCOUNTER — Encounter (HOSPITAL_COMMUNITY): Payer: Self-pay

## 2018-09-23 DIAGNOSIS — L0291 Cutaneous abscess, unspecified: Secondary | ICD-10-CM

## 2018-09-23 DIAGNOSIS — M25579 Pain in unspecified ankle and joints of unspecified foot: Secondary | ICD-10-CM

## 2018-09-23 MED ORDER — MUPIROCIN CALCIUM 2 % EX CREA: 1.0000 "application " | TOPICAL_CREAM | Freq: Two times a day (BID) | CUTANEOUS | 0 refills | Status: DC

## 2018-09-23 MED ORDER — IBUPROFEN 600 MG PO TABS: 600.0000 mg | ORAL_TABLET | Freq: Four times a day (QID) | ORAL | 0 refills | Status: DC | PRN

## 2018-09-23 NOTE — ED Provider Notes (Signed)
[facility]    CSN: [id number] Arrival date & time: 09/23/18  1819     History   Chief Complaint Chief Complaint  Patient presents with  . Foot Pain  . Abscess    HPI Kimberly Anderson is a 53 y.o. female.   Patient presents with a small "abscess" on her lower abdomen x2 days; she reports it was draining pus earlier today.  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  She denies fever or chills.  She denies weakness, paresthesias, numbness in her LE.     The history is provided by the patient.    Past Medical History:  Diagnosis Date  . 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 Fibroids   . Gall stones   . Hypertension   . No pertinent past medical history   . Obesity     Patient Active Problem List   Diagnosis Date Noted  . Acute cholecystitis 05/02/2015  . Abdominal pain 02/28/2012  . Anemia 01/09/2012  . Fatigue 01/09/2012  . Hot flashes 01/09/2012  . Morbid obesity ([place]) 01/09/2012  . Neck pain 01/09/2012  . Hot flashes due to surgical menopause 11/06/2011  . Leg pain, bilateral 11/06/2011  . Iron deficiency anemia 11/06/2011  . Cervical strain 11/27/2010  . Preventative health care 11/06/2010    Past Surgical History:  Procedure Laterality Date  . ABDOMINAL HYSTERECTOMY  03/11/2011   Procedure: HYSTERECTOMY ABDOMINAL;  Surgeon: Juliene Pina C. Hulan Fray, MD;  Location: [facility];  Service: Gynecology;  Laterality: N/A;  Possible removal of tubes & ovaries  . Bilateral tubal ligation  1996  . CHOLECYSTECTOMY N/A 05/02/2015   Procedure: LAPAROSCOPIC CHOLECYSTECTOMY;  Surgeon: Ralene Ok, MD;  Location: [facility];  Service: General;  Laterality: N/A;  . SALPINGOOPHORECTOMY  03/11/2011   Procedure: SALPINGO OOPHERECTOMY;  Surgeon: Wilhemina Cash. Hulan Fray, MD;  Location: [facility];  Service: Gynecology;  Laterality:  Bilateral;  . SVD     x 3    OB History    Gravida  4   Para  3   Term  3   Preterm  0   AB  1   Living        SAB  1   TAB      Ectopic      Multiple      Live Births               Home Medications    Prior to Admission medications   Medication Sig Start Date End Date Taking? Authorizing Provider  Ascorbic Acid (VITAMIN C PO) Take 1 tablet by mouth daily.    [provider]  bisacodyl (DULCOLAX) 5 MG EC tablet Take 2 tablets (10 mg total) by mouth daily as needed for moderate constipation. Patient not taking: Reported on 05/21/2017 06/16/16   Delia Heady, PA-C  hydrochlorothiazide (HYDRODIURIL) 25 MG tablet Take 25 mg by mouth daily. 03/04/17   [provider]  ibuprofen (ADVIL) 600 MG tablet Take 1 tablet (600 mg total) by mouth every 6 (six) hours as needed. 09/23/18   Sharion Balloon, NP  Multiple Vitamin (MULTIVITAMIN) capsule Take 1 capsule by mouth daily.    [provider]  mupirocin cream (BACTROBAN) 2 % Apply 1 application topically 2 (two) times daily. 09/23/18   Barkley Boards  H, NP  nabumetone (RELAFEN) 500 MG tablet TAKE 1 TABLET BY MOUTH TWICE A DAY 12/03/16   Sheard, Myeong O, DPM  naproxen (NAPROSYN) 500 MG tablet Take 1 tablet (500 mg total) by mouth 2 (two) times daily. 11/18/17   Maudie Flakes, MD  orphenadrine (NORFLEX) 100 MG tablet Take 1 tablet (100 mg total) by mouth 2 (two) times daily. 11/22/17   Tacy Learn, PA-C  oxyCODONE-acetaminophen (PERCOCET) 7.5-325 MG tablet Take 1 tablet by mouth every 6 (six) hours as needed. 09/11/16   Sheard, Myeong O, DPM  polyethylene glycol (MIRALAX) packet Take 17 g by mouth daily. 06/16/16   Delia Heady, PA-C  predniSONE (DELTASONE) 10 MG tablet 12 day tapering dose 05/21/17   Wallene Huh, DPM    Family History Family History  Problem Relation Age of Onset  . Diabetes type II Mother        Not currently on insulin but had been at one time  . Hypertension Mother   .  Hyperlipidemia Mother   . Coronary artery disease Mother        s/p stents  . Hypertension Father   . Hyperlipidemia Father   . Prostate cancer Father 57       s/p radiation  . Stroke Father 32  . Fibroids Sister        s/p fibroidectomy 2/2 dub  . Breast cancer Maternal Aunt     Social History Social History   Tobacco Use  . Smoking status: Former Smoker    Packs/day: 0.30    Years: 35.00    Pack years: 10.50    Types: Cigarettes    Quit date: 06/06/2010    Years since quitting: 8.3  . Smokeless tobacco: Never Used  Substance Use Topics  . Alcohol use: No  . Drug use: No     Allergies   Patient has no known allergies.   Review of Systems Review of Systems  Constitutional: Negative for chills and fever.  HENT: Negative for ear pain and sore throat.   Eyes: Negative for pain and visual disturbance.  Respiratory: Negative for cough and shortness of breath.   Cardiovascular: Negative for chest pain and palpitations.  Gastrointestinal: Negative for abdominal pain and vomiting.  Genitourinary: Negative for dysuria and hematuria.  Musculoskeletal: Positive for arthralgias. Negative for back pain.  Skin: Positive for wound. Negative for color change and rash.  Neurological: Negative for seizures and syncope.  All other systems reviewed and are negative.    Physical Exam Triage Vital Signs ED Triage Vitals  Enc Vitals Group     BP 09/23/18 1851 (!) 135/96     Pulse --      Resp 09/23/18 1851 12     Temp 09/23/18 1851 98.8 F (37.1 C)     Temp Source 09/23/18 1851 Oral     SpO2 09/23/18 1851 99 %     Weight 09/23/18 1847 240 lb (108.9 kg)     Height --      Head Circumference --      Peak Flow --      Pain Score 09/23/18 1847 10     Pain Loc --      Pain Edu? --      Excl. in [place]? --    No data found.  Updated Vital Signs BP (!) 135/96 (BP Location: Right Arm)   Temp 98.8 F (37.1 C) (Oral)   Resp 12   Wt 240 lb (108.9 kg)  LMP 03/05/2011   SpO2  99%   BMI 35.44 kg/m   Visual Acuity Right Eye Distance:   Left Eye Distance:   Bilateral Distance:    Right Eye Near:   Left Eye Near:    Bilateral Near:     Physical Exam Vitals signs and nursing note reviewed.  Constitutional:      General: She is not in acute distress.    Appearance: She is well-developed.  HENT:     Head: Normocephalic and atraumatic.  Eyes:     Conjunctiva/sclera: Conjunctivae normal.  Neck:     Musculoskeletal: Neck supple.  Cardiovascular:     Rate and Rhythm: Normal rate and regular rhythm.     Heart sounds: No murmur.  Pulmonary:     Effort: Pulmonary effort is normal. No respiratory distress.     Breath sounds: Normal breath sounds.  Abdominal:     Palpations: Abdomen is soft.     Tenderness: There is no abdominal tenderness.  Musculoskeletal: Normal range of motion.        General: No tenderness, deformity or signs of injury.  Skin:    General: Skin is warm and dry.     Findings: No bruising or erythema.     Comments: 3 mm open wound on lower abdomen below the umbilicus; no erythema or drainage.    Neurological:     General: No focal deficit present.     Mental Status: She is alert and oriented to person, place, and time.     Sensory: No sensory deficit.     Motor: No weakness.      UC Treatments / Results  Labs (all labs ordered are listed, but only abnormal results are displayed) Labs Reviewed - No data to display  EKG   Radiology No results found.  Procedures Procedures (including critical care time)  Medications Ordered in UC Medications - No data to display  Initial Impression / Assessment and Plan / UC Course  I have reviewed the triage vital signs and the nursing notes.  Pertinent labs & imaging results that were available during my care of the patient were reviewed by me and considered in my medical decision making (see chart for details).   Abscess, lower abdomen.  Bilateral foot pain.  Wound cleaned and  dressed with Bactroban and bandage;  Bactroban prescription called in.  Discussed with patient that she can take prescribed ibuprofen as needed for her bilateral feet pain; and that she should rest and elevate them as she is able to.  Instructed patient to return here or go to the emergency department if she develops fever, chills, weakness, paresthesias, numbness, or other symptoms.     Final Clinical Impressions(s) / UC Diagnoses   Final diagnoses:  Abscess  Pain in joint involving ankle and foot, unspecified laterality     Discharge Instructions     Keep your wound clean and dry.  Use the antibiotic cream twice a day.    Take the ibuprofen as needed for your feet pain.  Rest and elevate them as you are able.    Return here or go to the emergency department if you develop fever, chills, weakness, numbness, tingling, or other symptoms.        ED Prescriptions    Medication Sig Dispense Auth. Provider   ibuprofen (ADVIL) 600 MG tablet Take 1 tablet (600 mg total) by mouth every 6 (six) hours as needed. 30 tablet Sharion Balloon, NP   mupirocin cream Drue Stager)  2 % Apply 1 application topically 2 (two) times daily. 15 g Sharion Balloon, NP     Controlled Substance Prescriptions [place] Controlled Substance Registry consulted? Not Applicable   Sharion Balloon, NP 09/23/18 [phone]

## 2018-09-23 NOTE — Discharge Instructions (Addendum)
Keep your wound clean and dry.  Use the antibiotic cream twice a day.    Take the ibuprofen as needed for your feet pain.  Rest and elevate them as you are able.    Return here or go to the emergency department if you develop fever, chills, weakness, numbness, tingling, or other symptoms.

## 2018-09-23 NOTE — ED Triage Notes (Signed)
Pt states she has a abscess  on her stomach. Pt states her feet all of the time.

## 2018-10-29 DIAGNOSIS — M2142 Flat foot [pes planus] (acquired), left foot: Secondary | ICD-10-CM | POA: Insufficient documentation

## 2018-10-29 DIAGNOSIS — B351 Tinea unguium: Secondary | ICD-10-CM | POA: Insufficient documentation

## 2018-10-29 DIAGNOSIS — M7741 Metatarsalgia, right foot: Secondary | ICD-10-CM | POA: Insufficient documentation

## 2018-10-29 DIAGNOSIS — M2141 Flat foot [pes planus] (acquired), right foot: Secondary | ICD-10-CM | POA: Insufficient documentation

## 2018-10-29 DIAGNOSIS — M7742 Metatarsalgia, left foot: Secondary | ICD-10-CM | POA: Insufficient documentation

## 2018-11-10 ENCOUNTER — Ambulatory Visit: Payer: 59 | Admitting: Podiatry

## 2018-11-10 ENCOUNTER — Ambulatory Visit (INDEPENDENT_AMBULATORY_CARE_PROVIDER_SITE_OTHER): Payer: 59

## 2018-11-10 ENCOUNTER — Other Ambulatory Visit: Payer: Self-pay

## 2018-11-10 ENCOUNTER — Encounter: Payer: Self-pay | Admitting: Podiatry

## 2018-11-10 DIAGNOSIS — M779 Enthesopathy, unspecified: Secondary | ICD-10-CM | POA: Diagnosis not present

## 2018-11-10 DIAGNOSIS — M722 Plantar fascial fibromatosis: Secondary | ICD-10-CM

## 2018-11-14 NOTE — Progress Notes (Signed)
Subjective:   Patient ID: Kimberly Anderson, female   DOB: 53 y.o.   MRN: [id number]   HPI Patient presents stating getting pain in both feet which is been going on for a number months with the bottom of the heel being worse on the right and the ankle being worse on the left.  Patient states that this has been ongoing and gradually becoming more of an issue and patient does not smoke and likes to be active   Review of Systems  All other systems reviewed and are negative.       Objective:  Physical Exam Vitals signs and nursing note reviewed.  Constitutional:      Appearance: She is well-developed.  Pulmonary:     Effort: Pulmonary effort is normal.  Musculoskeletal: Normal range of motion.  Skin:    General: Skin is warm.  Neurological:     Mental Status: She is alert.     Neurovascular status intact muscle strength found to be adequate range of motion was within normal limits with patient noted to have exquisite discomfort right plantar heel and discomfort into the ankle left with inflammation fluid of the sinus tarsi.  Patient is found to have good digital perfusion well oriented x3     Assessment:  Acute plantar fasciitis right with capsulitis and sinus tarsitis left with inflammation fluid buildup     Plan:  H&P conditions reviewed and did sterile prep and injected the plantar fascia right 3 mg Kenalog 5 mg Xylocaine and then did sterile prep and injected the sinus tarsi left 3 mg Kenalog 5 mg Xylocaine and instructed on wider shoes and support therapy along with physical therapy  X-rays indicate there is spur formation plantar heel right over left with no indication of stress fracture arthritis

## 2018-11-24 ENCOUNTER — Ambulatory Visit: Payer: 59 | Admitting: Podiatry

## 2018-12-04 ENCOUNTER — Other Ambulatory Visit: Payer: Self-pay | Admitting: Internal Medicine

## 2018-12-04 DIAGNOSIS — E2839 Other primary ovarian failure: Secondary | ICD-10-CM

## 2018-12-17 ENCOUNTER — Other Ambulatory Visit: Payer: Self-pay | Admitting: Internal Medicine

## 2018-12-17 DIAGNOSIS — Z1231 Encounter for screening mammogram for malignant neoplasm of breast: Secondary | ICD-10-CM

## 2018-12-18 ENCOUNTER — Other Ambulatory Visit: Payer: Self-pay

## 2018-12-18 ENCOUNTER — Ambulatory Visit
Admission: RE | Admit: 2018-12-18 | Discharge: 2018-12-18 | Disposition: A | Discharge status: Home / Self Care | Payer: 59 | Source: Ambulatory Visit | Attending: Internal Medicine | Admitting: Internal Medicine

## 2018-12-18 DIAGNOSIS — Z1231 Encounter for screening mammogram for malignant neoplasm of breast: Secondary | ICD-10-CM

## 2019-01-07 ENCOUNTER — Telehealth: Payer: Self-pay | Admitting: *Deleted

## 2019-01-07 NOTE — Telephone Encounter (Signed)
Left patient a message to call and schedule. 

## 2019-01-07 NOTE — Telephone Encounter (Signed)
Returned call from 11/26/2018. 11/27/18 - Called patient but she was talking to someone else. Will call back.

## 2019-01-22 ENCOUNTER — Encounter: Payer: Self-pay | Admitting: Internal Medicine

## 2019-03-09 ENCOUNTER — Other Ambulatory Visit: Payer: 59

## 2019-03-17 DIAGNOSIS — M1990 Unspecified osteoarthritis, unspecified site: Secondary | ICD-10-CM | POA: Insufficient documentation

## 2019-08-04 ENCOUNTER — Ambulatory Visit (HOSPITAL_COMMUNITY)
Admission: EM | Admit: 2019-08-04 | Discharge: 2019-08-04 | Disposition: A | Discharge status: Home / Self Care | Payer: 59 | Attending: Internal Medicine | Admitting: Internal Medicine

## 2019-08-04 ENCOUNTER — Other Ambulatory Visit: Payer: Self-pay

## 2019-08-04 ENCOUNTER — Encounter (HOSPITAL_COMMUNITY): Payer: Self-pay

## 2019-08-04 DIAGNOSIS — I10 Essential (primary) hypertension: Secondary | ICD-10-CM | POA: Diagnosis not present

## 2019-08-04 DIAGNOSIS — U071 COVID-19: Secondary | ICD-10-CM | POA: Insufficient documentation

## 2019-08-04 DIAGNOSIS — Z87891 Personal history of nicotine dependence: Secondary | ICD-10-CM | POA: Diagnosis not present

## 2019-08-04 DIAGNOSIS — K0889 Other specified disorders of teeth and supporting structures: Secondary | ICD-10-CM | POA: Diagnosis present

## 2019-08-04 DIAGNOSIS — Z8249 Family history of ischemic heart disease and other diseases of the circulatory system: Secondary | ICD-10-CM | POA: Insufficient documentation

## 2019-08-04 DIAGNOSIS — B349 Viral infection, unspecified: Secondary | ICD-10-CM | POA: Diagnosis not present

## 2019-08-04 DIAGNOSIS — Z79899 Other long term (current) drug therapy: Secondary | ICD-10-CM | POA: Diagnosis not present

## 2019-08-04 MED ORDER — AMOXICILLIN-POT CLAVULANATE 875-125 MG PO TABS: 1.0000 | ORAL_TABLET | Freq: Two times a day (BID) | ORAL | 0 refills | Status: DC

## 2019-08-04 MED ORDER — IBUPROFEN 600 MG PO TABS: 600.0000 mg | ORAL_TABLET | Freq: Four times a day (QID) | ORAL | 0 refills | Status: DC | PRN

## 2019-08-04 MED ORDER — ONDANSETRON 4 MG PO TBDP: 4.0000 mg | ORAL_TABLET | Freq: Three times a day (TID) | ORAL | 0 refills | Status: DC | PRN

## 2019-08-04 NOTE — ED Provider Notes (Signed)
[facility]    CSN: [id number] Arrival date & time: 08/04/19  1529      History   Chief Complaint Chief Complaint  Patient presents with  . Covid Test  . Dental Pain    HPI Kimberly Anderson is a 54 y.o. female comes to the urgent care with complaints of dental pain over the past few days.  Patient attended [employer] over the weekend.  There was a large crowd of people there.  Following that the patient started having some generalized body aches and nausea as well as diarrhea.  One of the attendees of the funeral tested positive for COVID-19.  She denies any fever or chills.  No dizziness, near syncope or syncopal episode.  Patient is fully vaccinated against COVID-19 virus.  HPI  Past Medical History:  Diagnosis Date  . 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 Fibroids   . Gall stones   . Hypertension   . No pertinent past medical history   . Obesity     Patient Active Problem List   Diagnosis Date Noted  . Acute cholecystitis 05/02/2015  . Abdominal pain 02/28/2012  . Anemia 01/09/2012  . Fatigue 01/09/2012  . Hot flashes 01/09/2012  . Morbid obesity ([place]) 01/09/2012  . Neck pain 01/09/2012  . Hot flashes due to surgical menopause 11/06/2011  . Leg pain, bilateral 11/06/2011  . Iron deficiency anemia 11/06/2011  . Cervical strain 11/27/2010  . Preventative health care 11/06/2010    Past Surgical History:  Procedure Laterality Date  . ABDOMINAL HYSTERECTOMY  03/11/2011   Procedure: HYSTERECTOMY ABDOMINAL;  Surgeon: Juliene Pina C. Hulan Fray, MD;  Location: [facility];  Service: Gynecology;  Laterality: N/A;  Possible removal of tubes & ovaries  . Bilateral tubal ligation  1996  . CHOLECYSTECTOMY N/A 05/02/2015   Procedure: LAPAROSCOPIC CHOLECYSTECTOMY;  Surgeon: Ralene Ok, MD;  Location: [facility];  Service: General;  Laterality: N/A;  . SALPINGOOPHORECTOMY  03/11/2011   Procedure: SALPINGO OOPHERECTOMY;  Surgeon: Wilhemina Cash. Hulan Fray, MD;  Location: [facility];  Service: Gynecology;  Laterality: Bilateral;  . SVD     x 3    OB History    Gravida  4   Para  3   Term  3   Preterm  0   AB  1   Living        SAB  1   TAB      Ectopic      Multiple      Live Births               Home Medications    Prior to Admission medications   Medication Sig Start Date End Date Taking? Authorizing Provider  amoxicillin-clavulanate (AUGMENTIN) 875-125 MG tablet Take 1 tablet by mouth every 12 (twelve) hours. 08/04/19   LampteyMyrene Galas, MD  Ascorbic Acid (VITAMIN C PO) Take 1 tablet by mouth daily.    [provider]  hydrochlorothiazide (HYDRODIURIL) 25 MG tablet Take 25 mg by mouth daily. 03/04/17   [provider]  ibuprofen (ADVIL) 600 MG tablet Take 1 tablet (600 mg total) by mouth every 6 (six) hours as needed. 08/04/19   Chase Picket, MD  Multiple Vitamin (MULTIVITAMIN) capsule Take 1 capsule by mouth daily.    [provider]  mupirocin cream (BACTROBAN) 2 % Apply 1 application topically 2 (two) times daily. 09/23/18   Sharion Balloon, NP  nabumetone (RELAFEN) 500 MG tablet  TAKE 1 TABLET BY MOUTH TWICE A DAY 12/03/16   Sheard, Myeong O, DPM  naproxen (NAPROSYN) 500 MG tablet Take 1 tablet (500 mg total) by mouth 2 (two) times daily. 11/18/17   Maudie Flakes, MD  ondansetron (ZOFRAN ODT) 4 MG disintegrating tablet Take 1 tablet (4 mg total) by mouth every 8 (eight) hours as needed for nausea or vomiting. 08/04/19   Linn Clavin, Myrene Galas, MD  orphenadrine (NORFLEX) 100 MG tablet Take 1 tablet (100 mg total) by mouth 2 (two) times daily. 11/22/17   Tacy Learn, PA-C  oxyCODONE-acetaminophen (PERCOCET) 7.5-325 MG tablet Take 1 tablet by mouth every 6 (six) hours as needed. 09/11/16   Sheard, Myeong O, DPM  polyethylene glycol (MIRALAX) packet Take 17 g by mouth daily. 06/16/16   Delia Heady, PA-C  predniSONE (DELTASONE) 10 MG tablet 12 day tapering dose 05/21/17   Wallene Huh, DPM    Family History Family  History  Problem Relation Age of Onset  . Diabetes type II Mother        Not currently on insulin but had been at one time  . Hypertension Mother   . Hyperlipidemia Mother   . Coronary artery disease Mother        s/p stents  . Hypertension Father   . Hyperlipidemia Father   . Prostate cancer Father 50       s/p radiation  . Stroke Father 31  . Fibroids Sister        s/p fibroidectomy 2/2 dub  . Breast cancer Maternal Aunt     Social History Social History   Tobacco Use  . Smoking status: Former Smoker    Packs/day: 0.30    Years: 35.00    Pack years: 10.50    Types: Cigarettes    Quit date: 06/06/2010    Years since quitting: 9.1  . Smokeless tobacco: Never Used  Substance Use Topics  . Alcohol use: No  . Drug use: No     Allergies   Patient has no known allergies.   Review of Systems Review of Systems  Constitutional: Positive for activity change and fatigue. Negative for chills, fever and unexpected weight change.  HENT: Negative.   Gastrointestinal: Positive for nausea. Negative for diarrhea and vomiting.  Genitourinary: Negative.   Musculoskeletal: Positive for arthralgias and back pain.  Neurological: Negative.   Psychiatric/Behavioral: Negative.      Physical Exam Triage Vital Signs ED Triage Vitals [08/04/19 1608]  Enc Vitals Group     BP (!) 145/87     Pulse Rate 86     Resp 17     Temp 98.6 F (37 C)     Temp Source Oral     SpO2 99 %     Weight      Height      Head Circumference      Peak Flow      Pain Score 7     Pain Loc      Pain Edu?      Excl. in [place]?    No data found.  Updated Vital Signs BP (!) 145/87 (BP Location: Right Arm)   Pulse 86   Temp 98.6 F (37 C) (Oral)   Resp 17   LMP 03/05/2011   SpO2 99%   Visual Acuity Right Eye Distance:   Left Eye Distance:   Bilateral Distance:    Right Eye Near:   Left Eye Near:    Bilateral Near:  Physical Exam Vitals and nursing note reviewed.  Constitutional:       General: She is not in acute distress.    Appearance: Normal appearance. She is ill-appearing. She is not toxic-appearing.  HENT:     Nose: Nose normal.  Cardiovascular:     Rate and Rhythm: Normal rate and regular rhythm.  Abdominal:     General: Bowel sounds are normal.     Palpations: Abdomen is soft.  Musculoskeletal:        General: Normal range of motion.  Skin:    General: Skin is warm.     Capillary Refill: Capillary refill takes less than 2 seconds.     Coloration: Skin is not jaundiced.     Findings: No erythema.  Neurological:     General: No focal deficit present.     Mental Status: She is alert.      UC Treatments / Results  Labs (all labs ordered are listed, but only abnormal results are displayed) Labs Reviewed  SARS CORONAVIRUS 2 (TAT 6-24 HRS)    EKG   Radiology No results found.  Procedures Procedures (including critical care time)  Medications Ordered in UC Medications - No data to display  Initial Impression / Assessment and Plan / UC Course  I have reviewed the triage vital signs and the nursing notes.  Pertinent labs & imaging results that were available during my care of the patient were reviewed by me and considered in my medical decision making (see chart for details).     1.  Acute viral syndrome: COVID-19 PCR test sent Tylenol/Motrin as needed for body aches Zofran as needed for nausea  2.  Dental pain likely pulpitis: Augmentin 875-125 twice daily for 7 days Patient is advised to follow-up with the dentist Return precautions given Patient advised to quarantine until COVID-19 test results is available. Final Clinical Impressions(s) / UC Diagnoses   Final diagnoses:  Acute viral syndrome  Pain, dental   Discharge Instructions   None    ED Prescriptions    Medication Sig Dispense Auth. Provider   amoxicillin-clavulanate (AUGMENTIN) 875-125 MG tablet Take 1 tablet by mouth every 12 (twelve) hours. 14 tablet Tamyrah Burbage, Myrene Galas, MD   ondansetron (ZOFRAN ODT) 4 MG disintegrating tablet Take 1 tablet (4 mg total) by mouth every 8 (eight) hours as needed for nausea or vomiting. 20 tablet Rochell Mabie, Myrene Galas, MD   ibuprofen (ADVIL) 600 MG tablet Take 1 tablet (600 mg total) by mouth every 6 (six) hours as needed. 30 tablet Cortlan Dolin, Myrene Galas, MD     PDMP not reviewed this encounter.   Chase Picket, MD 08/04/19 [phone]

## 2019-08-04 NOTE — ED Triage Notes (Signed)
Pt presents for covid testing after an exposure at a funeral a few days ago; pt also presents with right side dental pain.

## 2019-08-05 LAB — SARS CORONAVIRUS 2 (TAT 6-24 HRS): SARS Coronavirus 2: POSITIVE — AB

## 2019-08-06 ENCOUNTER — Telehealth: Payer: Self-pay | Admitting: Physician Assistant

## 2019-08-06 NOTE — Telephone Encounter (Addendum)
Called to discuss with Valma Cava about Covid symptoms and the use of bamlanivimab/etesevimab or casirivimab/imdevimab, a monoclonal antibody infusion for those with mild to moderate Covid symptoms and at a high risk of hospitalization.     Pt is qualified for this infusion at the [facility] infusion center due to co-morbid conditions and/or a member of an at-risk group (BMI >25, HTN), however she wants to think more about the infusion at this time. Symptoms tier reviewed as well as criteria for ending isolation.  Symptoms reviewed that would warrant ED/Hospital evaluation. Preventative practices reviewed. Patient verbalized understanding. Patient advised to call back if he decides that he does want to get infusion. Callback number to the infusion center given. Patient advised to go to Urgent care or ED with severe symptoms. Last date pt would be eligible for infusion is 6/11.   She has our hotline number if she wants to call us back.  Patient Active Problem List   Diagnosis Date Noted  . Acute cholecystitis 05/02/2015  . Abdominal pain 02/28/2012  . Anemia 01/09/2012  . Fatigue 01/09/2012  . Hot flashes 01/09/2012  . Morbid obesity ([place]) 01/09/2012  . Neck pain 01/09/2012  . Hot flashes due to surgical menopause 11/06/2011  . Leg pain, bilateral 11/06/2011  . Iron deficiency anemia 11/06/2011  . Cervical strain 11/27/2010  . Preventative health care 11/06/2010    Angelena Form PA-C

## 2019-08-16 ENCOUNTER — Other Ambulatory Visit: Payer: Self-pay

## 2019-08-16 ENCOUNTER — Ambulatory Visit (HOSPITAL_COMMUNITY)
Admission: EM | Admit: 2019-08-16 | Discharge: 2019-08-16 | Disposition: A | Discharge status: Home / Self Care | Payer: 59

## 2019-08-29 ENCOUNTER — Other Ambulatory Visit: Payer: Self-pay

## 2019-08-29 ENCOUNTER — Encounter (HOSPITAL_COMMUNITY): Payer: Self-pay | Admitting: Emergency Medicine

## 2019-08-29 ENCOUNTER — Ambulatory Visit (HOSPITAL_COMMUNITY)
Admission: EM | Admit: 2019-08-29 | Discharge: 2019-08-29 | Disposition: A | Discharge status: Home / Self Care | Payer: 59 | Attending: Urgent Care | Admitting: Urgent Care

## 2019-08-29 DIAGNOSIS — R002 Palpitations: Secondary | ICD-10-CM | POA: Diagnosis not present

## 2019-08-29 NOTE — ED Provider Notes (Signed)
[place] [facility]   MRN: [id number] DOB: [birth year]  Subjective:   Kimberly Anderson is a 54 y.o. female presenting for 1 week history of intermittent heart racing and palpitations.  Patient states that symptoms occur randomly and last for a few minutes, has noticed for most of the time they happen when she is sitting.  Denies fever, chest pain, shortness of breath, abdominal pain.  Denies history of heart disease, MI.  Patient is actually taking phentermine right now.  This is her only medication.    No Known Allergies  Past Medical History:  Diagnosis Date  . 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 Fibroids   . Gall stones   . Hypertension   . No pertinent past medical history   . Obesity      Past Surgical History:  Procedure Laterality Date  . ABDOMINAL HYSTERECTOMY  03/11/2011   Procedure: HYSTERECTOMY ABDOMINAL;  Surgeon: Juliene Pina C. Hulan Fray, MD;  Location: [facility];  Service: Gynecology;  Laterality: N/A;  Possible removal of tubes & ovaries  . Bilateral tubal ligation  1996  . CHOLECYSTECTOMY N/A 05/02/2015   Procedure: LAPAROSCOPIC CHOLECYSTECTOMY;  Surgeon: Ralene Ok, MD;  Location: [facility];  Service: General;  Laterality: N/A;  . SALPINGOOPHORECTOMY  03/11/2011   Procedure: SALPINGO OOPHERECTOMY;  Surgeon: Wilhemina Cash. Hulan Fray, MD;  Location: [facility];  Service: Gynecology;  Laterality: Bilateral;  . SVD     x 3    Family History  Problem Relation Age of Onset  . Diabetes type II Mother        Not currently on insulin but had been at one time  . Hypertension Mother   . Hyperlipidemia Mother   . Coronary artery disease Mother        s/p stents  . Hypertension Father   . Hyperlipidemia Father   . Prostate cancer Father 61       s/p radiation  . Stroke Father 36  . Fibroids Sister        s/p fibroidectomy 2/2 dub  . Breast cancer Maternal Aunt     Social History   Tobacco Use  . Smoking status: Former Smoker    Packs/day: 0.30    Years:  35.00    Pack years: 10.50    Types: Cigarettes    Quit date: 06/06/2010    Years since quitting: 9.2  . Smokeless tobacco: Never Used  Substance Use Topics  . Alcohol use: No  . Drug use: No    ROS   Objective:   Vitals: BP (!) 143/95   Pulse 81   Temp 98.5 F (36.9 C) (Oral)   Resp 16   LMP 03/05/2011   SpO2 100%   Physical Exam Constitutional:      General: She is not in acute distress.    Appearance: Normal appearance. She is well-developed. She is not ill-appearing, toxic-appearing or diaphoretic.  HENT:     Head: Normocephalic and atraumatic.     Nose: Nose normal.     Mouth/Throat:     Mouth: Mucous membranes are moist.  Eyes:     Extraocular Movements: Extraocular movements intact.     Pupils: Pupils are equal, round, and reactive to light.  Cardiovascular:     Rate and Rhythm: Normal rate and regular rhythm.     Pulses: Normal pulses.     Heart sounds: Normal heart sounds. No murmur heard.  No friction rub. No gallop.   Pulmonary:  Effort: Pulmonary effort is normal. No respiratory distress.     Breath sounds: Normal breath sounds. No stridor. No wheezing, rhonchi or rales.  Skin:    General: Skin is warm and dry.     Findings: No rash.  Neurological:     Mental Status: She is alert and oriented to person, place, and time.  Psychiatric:        Mood and Affect: Mood normal.        Behavior: Behavior normal.        Thought Content: Thought content normal.        Judgment: Judgment normal.    ED ECG REPORT   Date: 08/29/2019  Rate: 84bpm  Rhythm: normal sinus rhythm  QRS Axis: normal  Intervals: normal  ST/T Wave abnormalities: normal  Conduction Disutrbances:none  Narrative Interpretation: Sinus rhythm at 84 bpm.  No previous EKG for comparison.  Old EKG Reviewed: none available  I have personally reviewed the EKG tracing and agree with the computerized printout as noted.   Assessment and Plan :   PDMP not reviewed this  encounter.  1. Palpitations     Recommended patient follow-up with PCP, discuss use of phentermine given her current symptoms.  Also discussed that she may end up needing to go to cardiologist for Holter monitor given her palpitations.  Vital signs, physical exam findings stable for discharge.  Counseled patient on potential for adverse effects with medications prescribed/recommended today, ER and return-to-clinic precautions discussed, patient verbalized understanding.    Jaynee Eagles, PA-C 08/29/19 1356

## 2019-08-29 NOTE — ED Triage Notes (Signed)
Had COVID 3 weeks ago

## 2019-08-29 NOTE — ED Triage Notes (Signed)
Intermittent palpitations for 1 week. Last episode was yesterday. Lasts for a few minutes when they occur. Episodes have occurred at rest and while active. Central chest tightness when this occurs. No radiation. Denies nausea, dizziness, diaphoresis.

## 2019-08-29 NOTE — Discharge Instructions (Signed)
Make sure you follow-up with your regular doctor to discuss use of phentermine given your history of high blood pressure, elevated blood pressure today and symptoms of heart racing and palpitations.  You may end up needing to follow-up with a cardiologist for consultation and consideration of a Holter monitor to try and evaluate for any abnormal heart rhythms at home when he had rare palpitations.  If you develop chest pain, shortness of breath, profuse sweating, abdominal pain then please report to the emergency room immediately.

## 2019-11-03 ENCOUNTER — Encounter (HOSPITAL_COMMUNITY): Payer: Self-pay

## 2019-11-03 ENCOUNTER — Other Ambulatory Visit: Payer: Self-pay

## 2019-11-03 ENCOUNTER — Ambulatory Visit (HOSPITAL_COMMUNITY)
Admission: EM | Admit: 2019-11-03 | Discharge: 2019-11-03 | Disposition: A | Discharge status: Home / Self Care | Payer: 59 | Attending: Family Medicine | Admitting: Family Medicine

## 2019-11-03 DIAGNOSIS — R52 Pain, unspecified: Secondary | ICD-10-CM | POA: Insufficient documentation

## 2019-11-03 DIAGNOSIS — Z833 Family history of diabetes mellitus: Secondary | ICD-10-CM | POA: Diagnosis not present

## 2019-11-03 DIAGNOSIS — Z90710 Acquired absence of both cervix and uterus: Secondary | ICD-10-CM | POA: Insufficient documentation

## 2019-11-03 DIAGNOSIS — Z20822 Contact with and (suspected) exposure to covid-19: Secondary | ICD-10-CM | POA: Diagnosis not present

## 2019-11-03 DIAGNOSIS — R0981 Nasal congestion: Secondary | ICD-10-CM | POA: Insufficient documentation

## 2019-11-03 DIAGNOSIS — Z87891 Personal history of nicotine dependence: Secondary | ICD-10-CM | POA: Insufficient documentation

## 2019-11-03 DIAGNOSIS — I10 Essential (primary) hypertension: Secondary | ICD-10-CM | POA: Diagnosis not present

## 2019-11-03 DIAGNOSIS — J029 Acute pharyngitis, unspecified: Secondary | ICD-10-CM

## 2019-11-03 DIAGNOSIS — Z90721 Acquired absence of ovaries, unilateral: Secondary | ICD-10-CM | POA: Insufficient documentation

## 2019-11-03 DIAGNOSIS — E669 Obesity, unspecified: Secondary | ICD-10-CM | POA: Diagnosis not present

## 2019-11-03 DIAGNOSIS — Z9049 Acquired absence of other specified parts of digestive tract: Secondary | ICD-10-CM | POA: Diagnosis not present

## 2019-11-03 DIAGNOSIS — Z8249 Family history of ischemic heart disease and other diseases of the circulatory system: Secondary | ICD-10-CM | POA: Insufficient documentation

## 2019-11-03 MED ORDER — PREDNISONE 20 MG PO TABS
40.0000 mg | ORAL_TABLET | Freq: Every day | ORAL | 0 refills | Status: DC
Start: 2019-11-03 — End: 2020-05-02

## 2019-11-03 NOTE — Discharge Instructions (Addendum)
You have been tested for COVID-19 today. °If your test returns positive, you will receive a phone call from [facility] regarding your results. °Negative test results are not called. °Both positive and negative results area always visible on MyChart. °If you do not have a MyChart account, sign up instructions are provided in your discharge papers. °Please do not hesitate to contact us should you have questions or concerns. ° °

## 2019-11-03 NOTE — ED Triage Notes (Signed)
Pt presents with sore throat, congestion, generalized body aches, and headache since Sunday.

## 2019-11-04 LAB — SARS CORONAVIRUS 2 (TAT 6-24 HRS): SARS Coronavirus 2: NEGATIVE

## 2019-11-04 NOTE — ED Provider Notes (Signed)
[facility]   [id number] 11/03/19 Arrival Time: 1937  ASSESSMENT & PLAN:  1. Sore throat   2. Nasal congestion   3. Generalized body aches      COVID-19 testing sent. See letter/work note on file for self-isolation guidelines. OTC symptom care as needed.  Begin trial of below for throat inflammation/pain.  Meds ordered this encounter  Medications  . predniSONE (DELTASONE) 20 MG tablet    Sig: Take 2 tablets (40 mg total) by mouth daily.    Dispense:  10 tablet    Refill:  0     Follow-up Information    Nolene Ebbs, MD.   Specialty: Internal Medicine Why: As needed. Contact information: [address] [place] [address] [phone]               Reviewed expectations re: course of current medical issues. Questions answered. Outlined signs and symptoms indicating need for more acute intervention. Understanding verbalized. After Visit Summary given.   SUBJECTIVE: History from: patient. Kimberly Anderson is a 54 y.o. female who requests sore throat, nasal congestion, body aches, and intermittent headache for the past several days. Known COVID-19 contact: none. Recent travel: none. Denies: fever, SOB. Normal PO intake without n/v/d.    OBJECTIVE:  Vitals:   11/03/19 2027  BP: (!) 149/98  Pulse: 82  Resp: 17  Temp: 98.7 F (37.1 C)  TempSrc: Oral  SpO2: 98%    General appearance: alert; no distress Eyes: PERRLA; EOMI; conjunctiva normal HENT: [place]; AT; nasal congestion; significant throat erythema/cobblestoning; normal tonsils Neck: supple; no cervical LAD Lungs: speaks full sentences without difficulty; unlabored Extremities: no edema Skin: warm and dry Neurologic: normal gait Psychological: alert and cooperative; normal mood and affect  Labs:  Labs Reviewed  SARS CORONAVIRUS 2 (TAT 6-24 HRS)     No Known Allergies  Past Medical History:  Diagnosis Date  . Drug abuse ([place])    clean from cocaine/marijuana since  2007  . Fibroids   . Gall stones   . Hypertension   . No pertinent past medical history   . Obesity    Social History   Socioeconomic History  . Marital status: Legally Separated    Spouse name: Not on file  . Number of children: Not on file  . Years of education: Not on file  . Highest education level: Not on file  Occupational History  . Not on file  Tobacco Use  . Smoking status: Former Smoker    Packs/day: 0.30    Years: 35.00    Pack years: 10.50    Types: Cigarettes    Quit date: 06/06/2010    Years since quitting: 9.4  . Smokeless tobacco: Never Used  Substance and Sexual Activity  . Alcohol use: No  . Drug use: No  . Sexual activity: Not Currently    Birth control/protection: Surgical  Other Topics Concern  . Not on file  Social History Narrative   Lives with son (chance), daughter (terri), and mother in [place].    Divorced. Sexually active with ex-husband occasionally; he's her only partner.    Works with [employer] ([employer]) and [employer].    Went to school through 11th grade.    Religion: none   Social Determinants of Health   Financial Resource Strain:   . Difficulty of Paying Living Expenses: Not on file  Food Insecurity:   . Worried About Charity fundraiser in the Last Year: Not on file  . Ran Out  of Food in the Last Year: Not on file  Transportation Needs:   . Lack of Transportation (Medical): Not on file  . Lack of Transportation (Non-Medical): Not on file  Physical Activity:   . Days of Exercise per Week: Not on file  . Minutes of Exercise per Session: Not on file  Stress:   . Feeling of Stress : Not on file  Social Connections:   . Frequency of Communication with Friends and Family: Not on file  . Frequency of Social Gatherings with Friends and Family: Not on file  . Attends Religious Services: Not on file  . Active Member of Clubs or Organizations: Not on file  . Attends Archivist Meetings: Not on file  .  Marital Status: Not on file  Intimate Partner Violence:   . Fear of Current or Ex-Partner: Not on file  . Emotionally Abused: Not on file  . Physically Abused: Not on file  . Sexually Abused: Not on file   Family History  Problem Relation Age of Onset  . Diabetes type II Mother        Not currently on insulin but had been at one time  . Hypertension Mother   . Hyperlipidemia Mother   . Coronary artery disease Mother        s/p stents  . Hypertension Father   . Hyperlipidemia Father   . Prostate cancer Father 51       s/p radiation  . Stroke Father 48  . Fibroids Sister        s/p fibroidectomy 2/2 dub  . Breast cancer Maternal Aunt    Past Surgical History:  Procedure Laterality Date  . ABDOMINAL HYSTERECTOMY  03/11/2011   Procedure: HYSTERECTOMY ABDOMINAL;  Surgeon: Juliene Pina C. Hulan Fray, MD;  Location: [facility];  Service: Gynecology;  Laterality: N/A;  Possible removal of tubes & ovaries  . Bilateral tubal ligation  1996  . CHOLECYSTECTOMY N/A 05/02/2015   Procedure: LAPAROSCOPIC CHOLECYSTECTOMY;  Surgeon: Ralene Ok, MD;  Location: [facility];  Service: General;  Laterality: N/A;  . SALPINGOOPHORECTOMY  03/11/2011   Procedure: SALPINGO OOPHERECTOMY;  Surgeon: Wilhemina Cash. Hulan Fray, MD;  Location: [facility];  Service: Gynecology;  Laterality: Bilateral;  . SVD     x 3     Vanessa Kick, MD 11/04/19 [phone]

## 2020-01-28 ENCOUNTER — Encounter (HOSPITAL_COMMUNITY): Payer: Self-pay | Admitting: Emergency Medicine

## 2020-01-28 ENCOUNTER — Emergency Department (HOSPITAL_COMMUNITY): Payer: 59

## 2020-01-28 ENCOUNTER — Emergency Department (HOSPITAL_COMMUNITY)
Admission: EM | Admit: 2020-01-28 | Discharge: 2020-01-28 | Disposition: A | Discharge status: Home / Self Care | Payer: 59 | Attending: Emergency Medicine | Admitting: Emergency Medicine

## 2020-01-28 DIAGNOSIS — Z87891 Personal history of nicotine dependence: Secondary | ICD-10-CM | POA: Diagnosis not present

## 2020-01-28 DIAGNOSIS — M79671 Pain in right foot: Secondary | ICD-10-CM | POA: Insufficient documentation

## 2020-01-28 DIAGNOSIS — M79672 Pain in left foot: Secondary | ICD-10-CM | POA: Insufficient documentation

## 2020-01-28 DIAGNOSIS — I10 Essential (primary) hypertension: Secondary | ICD-10-CM | POA: Diagnosis not present

## 2020-01-28 DIAGNOSIS — Z79899 Other long term (current) drug therapy: Secondary | ICD-10-CM | POA: Insufficient documentation

## 2020-01-28 NOTE — ED Notes (Signed)
Ortho Tech paged.

## 2020-01-28 NOTE — Discharge Instructions (Addendum)
Please take Tylenol (acetaminophen) to relieve your pain.  You may take tylenol, up to 1,000 mg (two extra strength pills).  Do not take more than 3,000 mg tylenol in a 24 hour period.  Please check all medication labels as many medications such as pain and cold medications may contain tylenol. Please do not drink alcohol while taking this medication.  ° °

## 2020-01-28 NOTE — ED Triage Notes (Addendum)
Patient complains of right foot pain after a cement table top fell over its stand and landed on her right foot on 01/26/20. PNS intact distal to injury but states it is difficult to wear shoes due to pain. Patient alert, oriented, and ambulatory at this time.

## 2020-01-28 NOTE — ED Provider Notes (Signed)
[place] [facility] EMERGENCY DEPARTMENT Provider Note   CSN: [id number] Arrival date & time: 01/28/20  1049     History Chief Complaint  Patient presents with  . Foot Pain    Kimberly Anderson is a 53 y.o. female no pertinent past medical history who presents today for evaluation of pain in her bilateral feet.  She reports that 2 days ago she had a table fall and landed on her feet.  Her primary area of pain is in her right foot and the right fourth toe.  She states that it hurts to put her shoes on due to this pain.  She has tried Tylenol without significant relief.  She denies any history of diabetes.  No wounds.  She denies any fevers or otherwise feeling unwell.  She additionally reports pain in her left foot however states that this is less right and she is not concerned that there is any broken bones in the left foot.  After we discussed utility of x-rays and that they are best served for evaluating osseous injuries she declined x-rays of the left foot.  HPI     Past Medical History:  Diagnosis Date  . 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 Fibroids   . Gall stones   . Hypertension   . No pertinent past medical history   . Obesity     Patient Active Problem List   Diagnosis Date Noted  . Acute cholecystitis 05/02/2015  . Abdominal pain 02/28/2012  . Anemia 01/09/2012  . Fatigue 01/09/2012  . Hot flashes 01/09/2012  . Morbid obesity ([place]) 01/09/2012  . Neck pain 01/09/2012  . Hot flashes due to surgical menopause 11/06/2011  . Leg pain, bilateral 11/06/2011  . Iron deficiency anemia 11/06/2011  . Cervical strain 11/27/2010  . Preventative health care 11/06/2010    Past Surgical History:  Procedure Laterality Date  . ABDOMINAL HYSTERECTOMY  03/11/2011   Procedure: HYSTERECTOMY ABDOMINAL;  Surgeon: Juliene Pina C. Hulan Fray, MD;  Location: [facility];  Service: Gynecology;  Laterality: N/A;  Possible removal of tubes & ovaries  . Bilateral  tubal ligation  1996  . CHOLECYSTECTOMY N/A 05/02/2015   Procedure: LAPAROSCOPIC CHOLECYSTECTOMY;  Surgeon: Ralene Ok, MD;  Location: [facility];  Service: General;  Laterality: N/A;  . SALPINGOOPHORECTOMY  03/11/2011   Procedure: SALPINGO OOPHERECTOMY;  Surgeon: Wilhemina Cash. Hulan Fray, MD;  Location: [facility];  Service: Gynecology;  Laterality: Bilateral;  . SVD     x 3     OB History    Gravida  4   Para  3   Term  3   Preterm  0   AB  1   Living        SAB  1   TAB      Ectopic      Multiple      Live Births              Family History  Problem Relation Age of Onset  . Diabetes type II Mother        Not currently on insulin but had been at one time  . Hypertension Mother   . Hyperlipidemia Mother   . Coronary artery disease Mother        s/p stents  . Hypertension Father   . Hyperlipidemia Father   . Prostate cancer Father 28       s/p radiation  . Stroke Father 26  . Fibroids Sister  s/p fibroidectomy 2/2 dub  . Breast cancer Maternal Aunt     Social History   Tobacco Use  . Smoking status: Former Smoker    Packs/day: 0.30    Years: 35.00    Pack years: 10.50    Types: Cigarettes    Quit date: 06/06/2010    Years since quitting: 9.6  . Smokeless tobacco: Never Used  Substance Use Topics  . Alcohol use: No  . Drug use: No    Home Medications Prior to Admission medications   Medication Sig Start Date End Date Taking? Authorizing Provider  Ascorbic Acid (VITAMIN C PO) Take 1 tablet by mouth daily.    [provider]  hydrochlorothiazide (HYDRODIURIL) 25 MG tablet Take 25 mg by mouth daily. 03/04/17   [provider]  ibuprofen (ADVIL) 600 MG tablet Take 1 tablet (600 mg total) by mouth every 6 (six) hours as needed. 08/04/19   Chase Picket, MD  Multiple Vitamin (MULTIVITAMIN) capsule Take 1 capsule by mouth daily.    [provider]  mupirocin cream (BACTROBAN) 2 % Apply 1 application topically 2 (two) times daily.  09/23/18   Sharion Balloon, NP  nabumetone (RELAFEN) 500 MG tablet TAKE 1 TABLET BY MOUTH TWICE A DAY 12/03/16   Sheard, Myeong O, DPM  naproxen (NAPROSYN) 500 MG tablet Take 1 tablet (500 mg total) by mouth 2 (two) times daily. 11/18/17   Maudie Flakes, MD  ondansetron (ZOFRAN ODT) 4 MG disintegrating tablet Take 1 tablet (4 mg total) by mouth every 8 (eight) hours as needed for nausea or vomiting. 08/04/19   Lamptey, Myrene Galas, MD  orphenadrine (NORFLEX) 100 MG tablet Take 1 tablet (100 mg total) by mouth 2 (two) times daily. 11/22/17   Tacy Learn, PA-C  oxyCODONE-acetaminophen (PERCOCET) 7.5-325 MG tablet Take 1 tablet by mouth every 6 (six) hours as needed. 09/11/16   Sheard, Myeong O, DPM  phentermine 37.5 MG capsule Take 37.5 mg by mouth every morning.    [provider]  polyethylene glycol (MIRALAX) packet Take 17 g by mouth daily. 06/16/16   Khatri, Hina, PA-C  predniSONE (DELTASONE) 20 MG tablet Take 2 tablets (40 mg total) by mouth daily. 11/03/19   Vanessa Kick, MD    Allergies    Patient has no known allergies.  Review of Systems   Review of Systems  Constitutional: Negative for chills and fever.  Musculoskeletal:       Pain and swelling of toes of right foot, pain in left foot  Neurological: Negative for weakness.  All other systems reviewed and are negative.   Physical Exam Updated Vital Signs BP (!) 149/70 (BP Location: Right Arm)   Pulse 69   Temp 98.3 F (36.8 C) (Oral)   Resp 16   LMP 03/05/2011   SpO2 100%   Physical Exam Vitals and nursing note reviewed.  Constitutional:      General: She is not in acute distress.    Appearance: She is not ill-appearing.  HENT:     Head: Normocephalic.  Cardiovascular:     Rate and Rhythm: Normal rate.     Pulses: Normal pulses.     Comments: 2+ DP/PT pulses bilaterally Pulmonary:     Effort: Pulmonary effort is normal. No respiratory distress.  Musculoskeletal:     Comments: Trace edema of right 4th toe.  No  obvious deformities of bilateral feet.  TTP diffusely across toes of right foot, worse on 4th toe. No TTP of  bilateral ankles.  Mild TTP diffuse across entire left foot.   Skin:    Comments: Contusion present on right toes, primarily on the 4th toe.  There is no obvious open wounds/skin breaks.  No evidence of secondary infection. Limited evaluation of subungual hematoma due to opaque nail polish but visible area of nail with out subungual hematoma.   Neurological:     Mental Status: She is alert.     Sensory: No sensory deficit (Sensation intact to light touch to bilateral feet).     ED Results / Procedures / Treatments   Labs (all labs ordered are listed, but only abnormal results are displayed) Labs Reviewed - No data to display  EKG None  Radiology DG Foot Complete Right  Result Date: 01/28/2020 CLINICAL DATA:  Injury, dropped concrete paper on toe. EXAM: RIGHT FOOT COMPLETE - 3+ VIEW COMPARISON:  November 10, 2018 FINDINGS: There is no evidence of fracture or dislocation. There is no evidence of arthropathy or other focal bone abnormality. Soft tissues are unremarkable. IMPRESSION: Negative evaluation of the RIGHT foot. Electronically Signed   By: Zetta Bills M.D.   On: 01/28/2020 11:33    Procedures Procedures (including critical care time)  Medications Ordered in ED Medications - No data to display  ED Course  I have reviewed the triage vital signs and the nursing notes.  Pertinent labs & imaging results that were available during my care of the patient were reviewed by me and considered in my medical decision making (see chart for details).    MDM Rules/Calculators/A&P                         Patient is a 54 year old woman who presents today for evaluation of pain in her bilateral feet after 2 days ago.  Had a concrete table fell on her feet.  X-rays of the foot was obtained without fracture or other acute abnormalities visualized.  On exam she has trace edema of the  right toes mostly on the right point tenderness without obvious deformity.  No obvious wounds or secondary bacterial infection.  She does report pain in her left foot, however we discussed role of x-ray imaging and that it is best suited for evaluation of osseous abnormalities, patient states that she not concerned about fracture.  "I know it is not broken" when referring to her left foot.  Patient declined x-rays of her left foot. Recommended conservative care.  Work note is given, postop shoe ordered.  Patient was informed that her blood pressure was elevated.  He was informed of need to follow-up with primary care doctor.    Return precautions were discussed with patient who states their understanding.  At the time of discharge patient denied any unaddressed complaints or concerns.  Patient is agreeable for discharge home.  Note: Portions of this report may have been transcribed using voice recognition software. Every effort was made to ensure accuracy; however, inadvertent computerized transcription errors may be present  Final Clinical Impression(s) / ED Diagnoses Final diagnoses:  Right foot pain  Foot pain, left    Rx / DC Orders ED Discharge Orders    None       Lorin Glass, PA-C 01/28/20 1233    Daleen Bo, MD 01/28/20 1624

## 2020-01-28 NOTE — ED Notes (Signed)
Pt stated she was going to see a podiatrist and did not want the boot at this time

## 2020-02-10 ENCOUNTER — Other Ambulatory Visit: Payer: Self-pay | Admitting: Internal Medicine

## 2020-02-10 DIAGNOSIS — Z1231 Encounter for screening mammogram for malignant neoplasm of breast: Secondary | ICD-10-CM

## 2020-02-11 ENCOUNTER — Encounter (HOSPITAL_COMMUNITY): Payer: Self-pay | Admitting: Emergency Medicine

## 2020-02-11 ENCOUNTER — Other Ambulatory Visit: Payer: Self-pay

## 2020-02-11 ENCOUNTER — Ambulatory Visit (HOSPITAL_COMMUNITY)
Admission: EM | Admit: 2020-02-11 | Discharge: 2020-02-11 | Disposition: A | Discharge status: Home / Self Care | Payer: 59

## 2020-02-11 ENCOUNTER — Emergency Department (HOSPITAL_COMMUNITY)
Admission: EM | Admit: 2020-02-11 | Discharge: 2020-02-11 | Disposition: A | Discharge status: Home / Self Care | Payer: 59 | Attending: Emergency Medicine | Admitting: Emergency Medicine

## 2020-02-11 DIAGNOSIS — Z5321 Procedure and treatment not carried out due to patient leaving prior to being seen by health care provider: Secondary | ICD-10-CM | POA: Insufficient documentation

## 2020-02-11 DIAGNOSIS — K0889 Other specified disorders of teeth and supporting structures: Secondary | ICD-10-CM | POA: Insufficient documentation

## 2020-02-11 DIAGNOSIS — N644 Mastodynia: Secondary | ICD-10-CM | POA: Insufficient documentation

## 2020-02-11 NOTE — ED Triage Notes (Signed)
Pt arrives to ED with complaints of dental pain x1 week and pain to her right breast x1 day. Pt states a lump appeared on her right breast yesterday.

## 2020-02-11 NOTE — ED Notes (Signed)
Called for vital signs x2, no answer

## 2020-02-16 ENCOUNTER — Ambulatory Visit: Payer: 59

## 2020-02-16 ENCOUNTER — Other Ambulatory Visit: Payer: Self-pay | Admitting: Internal Medicine

## 2020-02-16 DIAGNOSIS — N6310 Unspecified lump in the right breast, unspecified quadrant: Secondary | ICD-10-CM

## 2020-02-25 ENCOUNTER — Other Ambulatory Visit: Payer: Self-pay

## 2020-02-25 ENCOUNTER — Ambulatory Visit
Admission: EM | Admit: 2020-02-25 | Discharge: 2020-02-25 | Disposition: A | Discharge status: Home / Self Care | Payer: 59 | Attending: Emergency Medicine | Admitting: Emergency Medicine

## 2020-02-25 ENCOUNTER — Encounter: Payer: Self-pay | Admitting: Emergency Medicine

## 2020-02-25 DIAGNOSIS — J069 Acute upper respiratory infection, unspecified: Secondary | ICD-10-CM | POA: Diagnosis not present

## 2020-02-25 DIAGNOSIS — N611 Abscess of the breast and nipple: Secondary | ICD-10-CM

## 2020-02-25 DIAGNOSIS — Z20822 Contact with and (suspected) exposure to covid-19: Secondary | ICD-10-CM | POA: Diagnosis not present

## 2020-02-25 DIAGNOSIS — K0889 Other specified disorders of teeth and supporting structures: Secondary | ICD-10-CM

## 2020-02-25 MED ORDER — FLUTICASONE PROPIONATE 50 MCG/ACT NA SUSP: 1.0000 | Freq: Every day | NASAL | 0 refills | Status: DC

## 2020-02-25 MED ORDER — CETIRIZINE HCL 10 MG PO CAPS
10.0000 mg | ORAL_CAPSULE | Freq: Every day | ORAL | 0 refills | Status: DC
Start: 2020-02-25 — End: 2020-05-02

## 2020-02-25 MED ORDER — AMOXICILLIN-POT CLAVULANATE 875-125 MG PO TABS
1.0000 | ORAL_TABLET | Freq: Two times a day (BID) | ORAL | 0 refills | Status: AC
Start: 2020-02-25 — End: 2020-03-03

## 2020-02-25 MED ORDER — IBUPROFEN 800 MG PO TABS
800.0000 mg | ORAL_TABLET | Freq: Three times a day (TID) | ORAL | 0 refills | Status: DC
Start: 2020-02-25 — End: 2020-05-02

## 2020-02-25 NOTE — ED Triage Notes (Signed)
Pt here for left sided tooth pain; pt sts nasal congestion and cough starting yesterday; pt sts right sided breast mass

## 2020-02-25 NOTE — Discharge Instructions (Signed)
Covid test pending Begin daily cetirizine and Flonase to help with congestion drainage, sneezing, throat irritation Ibuprofen and Tylenol for dental pain Begin Augmentin twice daily for 1 week to cover dental infection as well as skin infection Warm compresses around this area Follow-up if any symptoms not improving or worsening

## 2020-02-25 NOTE — ED Provider Notes (Signed)
[facility]    CSN: [id number] Arrival date & time: 02/25/20  1020      History   Chief Complaint Chief Complaint  Patient presents with   Dental Pain    N     Nasal Congestion   Breast Mass    HPI Kimberly Anderson is a 54 y.o. female presenting today for evaluation of multiple complaints including dental pain, URI symptoms and right-sided breast mass. Reports 2 days of rhinorrhea, congestion, sneezing and throat irritation.  Denies cough or fevers.  Started after being outside at a [employer].  Also reports increased dental pain to her left upper jaw for the past 2 days.  Reports multiple teeth in this area that need to be pulled.  Denies any significant associated swelling.  Denies fevers or neck stiffness.  Also reports possible boil to her right breast.  Reports that it has developed swelling redness and pain for approximately 3 to 4 days.  Denies fevers.  Denies history of similar.  Has regular mammograms.  HPI  Past Medical History:  Diagnosis Date   Drug abuse ([facility])    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   Fibroids    Gall stones    Hypertension    No pertinent past medical history    Obesity     Patient Active Problem List   Diagnosis Date Noted   Acute cholecystitis 05/02/2015   Abdominal pain 02/28/2012   Anemia 01/09/2012   Fatigue 01/09/2012   Hot flashes 01/09/2012   Morbid obesity ([facility]) 01/09/2012   Neck pain 01/09/2012   Hot flashes due to surgical menopause 11/06/2011   Leg pain, bilateral 11/06/2011   Iron deficiency anemia 11/06/2011   Cervical strain 11/27/2010   Preventative health care 11/06/2010    Past Surgical History:  Procedure Laterality Date   ABDOMINAL HYSTERECTOMY  03/11/2011   Procedure: HYSTERECTOMY ABDOMINAL;  Surgeon: Hollie Salk C. Marice Potter, MD;  Location: [facility];  Service: Gynecology;  Laterality: N/A;  Possible removal of tubes & ovaries   Bilateral tubal ligation  1996    CHOLECYSTECTOMY N/A 05/02/2015   Procedure: LAPAROSCOPIC CHOLECYSTECTOMY;  Surgeon: Axel Filler, MD;  Location: [facility];  Service: General;  Laterality: N/A;   SALPINGOOPHORECTOMY  03/11/2011   Procedure: SALPINGO OOPHERECTOMY;  Surgeon: Leanora Ivanoff. Marice Potter, MD;  Location: [facility];  Service: Gynecology;  Laterality: Bilateral;   SVD     x 3    OB History    Gravida  4   Para  3   Term  3   Preterm  0   AB  1   Living        SAB  1   IAB      Ectopic      Multiple      Live Births               Home Medications    Prior to Admission medications   Medication Sig Start Date End Date Taking? Authorizing Provider  amoxicillin-clavulanate (AUGMENTIN) 875-125 MG tablet Take 1 tablet by mouth every 12 (twelve) hours for 7 days. 02/25/20 03/03/20  Jigar Zielke C, PA-C  Ascorbic Acid (VITAMIN C PO) Take 1 tablet by mouth daily.    [provider]  Cetirizine HCl 10 MG CAPS Take 1 capsule (10 mg total) by mouth daily for 10 days. 02/25/20 03/06/20  Morgen Linebaugh C, PA-C  fluticasone (FLONASE) 50 MCG/ACT nasal spray Place 1-2 sprays into both nostrils daily. 02/25/20  Angellee Cohill C, PA-C  hydrochlorothiazide (HYDRODIURIL) 25 MG tablet Take 25 mg by mouth daily. 03/04/17   [provider]  ibuprofen (ADVIL) 800 MG tablet Take 1 tablet (800 mg total) by mouth 3 (three) times daily. 02/25/20   Eric Nees C, PA-C  Multiple Vitamin (MULTIVITAMIN) capsule Take 1 capsule by mouth daily.    [provider]  mupirocin cream (BACTROBAN) 2 % Apply 1 application topically 2 (two) times daily. 09/23/18   Sharion Balloon, NP  nabumetone (RELAFEN) 500 MG tablet TAKE 1 TABLET BY MOUTH TWICE A DAY 12/03/16   Sheard, Myeong O, DPM  naproxen (NAPROSYN) 500 MG tablet Take 1 tablet (500 mg total) by mouth 2 (two) times daily. 11/18/17   Maudie Flakes, MD  ondansetron (ZOFRAN ODT) 4 MG disintegrating tablet Take 1 tablet (4 mg total) by mouth every 8 (eight) hours as  needed for nausea or vomiting. 08/04/19   Lamptey, Myrene Galas, MD  orphenadrine (NORFLEX) 100 MG tablet Take 1 tablet (100 mg total) by mouth 2 (two) times daily. 11/22/17   Tacy Learn, PA-C  oxyCODONE-acetaminophen (PERCOCET) 7.5-325 MG tablet Take 1 tablet by mouth every 6 (six) hours as needed. Patient not taking: Reported on 02/25/2020 09/11/16   Sheard, Myeong O, DPM  phentermine 37.5 MG capsule Take 37.5 mg by mouth every morning.    [provider]  polyethylene glycol (MIRALAX) packet Take 17 g by mouth daily. 06/16/16   Khatri, Hina, PA-C  predniSONE (DELTASONE) 20 MG tablet Take 2 tablets (40 mg total) by mouth daily. Patient not taking: Reported on 02/25/2020 11/03/19   Vanessa Kick, MD    Family History Family History  Problem Relation Age of Onset   Diabetes type II Mother        Not currently on insulin but had been at one time   Hypertension Mother    Hyperlipidemia Mother    Coronary artery disease Mother        s/p stents   Hypertension Father    Hyperlipidemia Father    Prostate cancer Father 10       s/p radiation   Stroke Father 13   Fibroids Sister        s/p fibroidectomy 2/2 dub   Breast cancer Maternal Aunt     Social History Social History   Tobacco Use   Smoking status: Former Smoker    Packs/day: 0.30    Years: 35.00    Pack years: 10.50    Types: Cigarettes    Quit date: 06/06/2010    Years since quitting: 9.7   Smokeless tobacco: Never Used  Substance Use Topics   Alcohol use: No   Drug use: No     Allergies   Patient has no known allergies.   Review of Systems Review of Systems  Constitutional: Negative for activity change, appetite change, chills, fatigue and fever.  HENT: Positive for congestion, dental problem, rhinorrhea, sinus pressure and sore throat. Negative for ear pain and trouble swallowing.   Eyes: Negative for discharge and redness.  Respiratory: Positive for cough. Negative for chest tightness and  shortness of breath.   Cardiovascular: Negative for chest pain.  Gastrointestinal: Negative for abdominal pain, diarrhea, nausea and vomiting.  Musculoskeletal: Negative for myalgias.  Skin: Negative for rash.  Neurological: Negative for dizziness, light-headedness and headaches.     Physical Exam Triage Vital Signs ED Triage Vitals [02/25/20 1118]  Enc Vitals Group     BP (!) 144/97  Pulse Rate 80     Resp 18     Temp 98.3 F (36.8 C)     Temp Source Oral     SpO2 99 %     Weight      Height      Head Circumference      Peak Flow      Pain Score 6     Pain Loc      Pain Edu?      Excl. in GC?    No data found.  Updated Vital Signs BP (!) 144/97 (BP Location: Right Arm)    Pulse 80    Temp 98.3 F (36.8 C) (Oral)    Resp 18    LMP 03/05/2011    SpO2 99%   Visual Acuity Right Eye Distance:   Left Eye Distance:   Bilateral Distance:    Right Eye Near:   Left Eye Near:    Bilateral Near:     Physical Exam Vitals and nursing note reviewed.  Constitutional:      Appearance: She is well-developed and well-nourished.     Comments: No acute distress  HENT:     Head: Normocephalic and atraumatic.     Ears:     Comments: Bilateral ears without tenderness to palpation of external auricle, tragus and mastoid, EAC's without erythema or swelling, TM's with good bony landmarks and cone of light. Non erythematous.     Nose: Nose normal.     Mouth/Throat:     Comments: Oral mucosa pink and moist, no tonsillar enlargement or exudate. Posterior pharynx patent and nonerythematous, no uvula deviation or swelling. Normal phonation. Eyes:     Conjunctiva/sclera: Conjunctivae normal.  Cardiovascular:     Rate and Rhythm: Normal rate and regular rhythm.  Pulmonary:     Effort: Pulmonary effort is normal. No respiratory distress.     Comments: Breathing comfortably at rest, CTABL, no wheezing, rales or other adventitious sounds auscultated Abdominal:     General: There is no  distension.  Musculoskeletal:        General: Normal range of motion.     Cervical back: Neck supple.  Skin:    General: Skin is warm and dry.     Comments: Right lower breast with area of mild induration and central fluctuance  Neurological:     Mental Status: She is alert and oriented to person, place, and time.  Psychiatric:        Mood and Affect: Mood and affect normal.      UC Treatments / Results  Labs (all labs ordered are listed, but only abnormal results are displayed) Labs Reviewed  NOVEL CORONAVIRUS, NAA    EKG   Radiology No results found.  Procedures Incision and Drainage  Date/Time: 02/25/2020 12:06 PM Performed by: Isaac Lacson, AndersonHallie C, PA-C Authorized by: Eural Holzschuh, Junius CreamerHallie C, PA-C   Consent:    Consent obtained:  Verbal   Consent given by:  Patient   Risks, benefits, and alternatives were discussed: yes     Risks discussed:  Bleeding and pain   Alternatives discussed:  No treatment Universal protocol:    Patient identity confirmed:  Verbally with patient Location:    Type:  Abscess   Size:  2   Location:  Trunk   Trunk location:  R breast Pre-procedure details:    Procedure prep: alcohol. Sedation:    Sedation type:  None Anesthesia:    Anesthesia method:  Local infiltration   Local anesthetic:  Lidocaine 1% w/o epi Procedure type:    Complexity:  Simple Procedure details:    Ultrasound guidance: no     Needle aspiration: no     Incision types:  Stab incision   Incision depth:  Subcutaneous   Wound management:  Probed and deloculated   Drainage:  Purulent and bloody   Drainage amount: mild.   Wound treatment:  Wound left open   Packing materials:  None Post-procedure details:    Procedure completion:  Tolerated well, no immediate complications   (including critical care time)  Medications Ordered in UC Medications - No data to display  Initial Impression / Assessment and Plan / UC Course  I have reviewed the triage vital signs  and the nursing notes.  Pertinent labs & imaging results that were available during my care of the patient were reviewed by me and considered in my medical decision making (see chart for details).     Covid test pending, URI symptoms likely viral versus allergic rhinitis and recommending symptomatic and supportive care rest and fluids.  Dental pain-covering with Augmentin for infection recommending anti-inflammatories warm compresses.  No sign of deep space abscess at this time.  I&D performed of abscess to right breast, relatively superficial.  Augmentin for infection and warm compresses.  Discussed strict return precautions. Patient verbalized understanding and is agreeable with plan.  Final Clinical Impressions(s) / UC Diagnoses   Final diagnoses:  Encounter for screening laboratory testing for COVID-19 virus  Viral URI  Pain, dental  Breast abscess     Discharge Instructions     Covid test pending Begin daily cetirizine and Flonase to help with congestion drainage, sneezing, throat irritation Ibuprofen and Tylenol for dental pain Begin Augmentin twice daily for 1 week to cover dental infection as well as skin infection Warm compresses around this area Follow-up if any symptoms not improving or worsening    ED Prescriptions    Medication Sig Dispense Auth. Provider   amoxicillin-clavulanate (AUGMENTIN) 875-125 MG tablet Take 1 tablet by mouth every 12 (twelve) hours for 7 days. 14 tablet Justyn Langham C, PA-C   fluticasone (FLONASE) 50 MCG/ACT nasal spray Place 1-2 sprays into both nostrils daily. 16 g Naturi Alarid C, PA-C   Cetirizine HCl 10 MG CAPS Take 1 capsule (10 mg total) by mouth daily for 10 days. 10 capsule Cordarious Zeek C, PA-C   ibuprofen (ADVIL) 800 MG tablet Take 1 tablet (800 mg total) by mouth 3 (three) times daily. 21 tablet Malvina Schadler, Leroy C, PA-C     PDMP not reviewed this encounter.   Janith Lima, [place] 02/25/20 1207

## 2020-02-28 LAB — NOVEL CORONAVIRUS, NAA: SARS-CoV-2, NAA: NOT DETECTED

## 2020-03-28 ENCOUNTER — Ambulatory Visit: Payer: 59

## 2020-03-29 ENCOUNTER — Other Ambulatory Visit: Payer: 59

## 2020-04-01 ENCOUNTER — Other Ambulatory Visit: Payer: Self-pay

## 2020-04-01 ENCOUNTER — Ambulatory Visit (HOSPITAL_COMMUNITY)
Admission: EM | Admit: 2020-04-01 | Discharge: 2020-04-01 | Disposition: A | Discharge status: Home / Self Care | Payer: 59 | Attending: Family Medicine | Admitting: Family Medicine

## 2020-04-01 ENCOUNTER — Encounter (HOSPITAL_COMMUNITY): Payer: Self-pay | Admitting: Emergency Medicine

## 2020-04-01 DIAGNOSIS — I10 Essential (primary) hypertension: Secondary | ICD-10-CM | POA: Diagnosis present

## 2020-04-01 DIAGNOSIS — R002 Palpitations: Secondary | ICD-10-CM | POA: Diagnosis present

## 2020-04-01 LAB — CBC
HCT: 37.9 % (ref 36.0–46.0)
Hemoglobin: 12.1 g/dL (ref 12.0–15.0)
MCH: 26.8 pg (ref 26.0–34.0)
MCHC: 31.9 g/dL (ref 30.0–36.0)
MCV: 84 fL (ref 80.0–100.0)
Platelets: 291 10*3/uL (ref 150–400)
RBC: 4.51 MIL/uL (ref 3.87–5.11)
RDW: 13.2 % (ref 11.5–15.5)
WBC: 9.2 10*3/uL (ref 4.0–10.5)
nRBC: 0 % (ref 0.0–0.2)

## 2020-04-01 LAB — BASIC METABOLIC PANEL
Anion gap: 7 (ref 5–15)
BUN: 10 mg/dL (ref 6–20)
CO2: 30 mmol/L (ref 22–32)
Calcium: 9.3 mg/dL (ref 8.9–10.3)
Chloride: 101 mmol/L (ref 98–111)
Creatinine, Ser: 0.81 mg/dL (ref 0.44–1.00)
GFR, Estimated: >60 mL/min (ref >60)
Glucose, Bld: 145 mg/dL — ABNORMAL HIGH (ref 70–99)
Potassium: 4.1 mmol/L (ref 3.5–5.1)
Sodium: 138 mmol/L (ref 135–145)

## 2020-04-01 LAB — TSH: TSH: 0.289 u[IU]/mL — ABNORMAL LOW (ref 0.350–4.500)

## 2020-04-01 NOTE — ED Triage Notes (Signed)
Pt presents today with c/o of elevated BP of 170/100 at home, headaches and heart "fluttering" x 1 week. Denies SOB or chest pain.

## 2020-04-01 NOTE — Discharge Instructions (Addendum)
You have had labs (blood work) drawn today. We will call you with any significant abnormalities or if there is need to begin or change treatment or indication to pursue further follow up.  You may also review your test results online through [facility]. If you do not have a MyChart account, instructions to sign up should be on your discharge paperwork.

## 2020-04-03 NOTE — ED Provider Notes (Signed)
[facility]   [id number] 04/01/20 Arrival Time: 0630  ASSESSMENT & PLAN:  1. Palpitations   2. Elevated blood pressure reading in office with diagnosis of hypertension    Labs pending.  ECG: Performed today and interpreted by me: normal EKG, normal sinus rhythm. Ressured.  Recommend:  Follow-up Information    Schedule an appointment as soon as possible for a visit  with [place].   Contact information: [place] [id number] [phone]            For current symptoms and BP check.    Discharge Instructions     You have had labs (blood work) drawn today. We will call you with any significant abnormalities or if there is need to begin or change treatment or indication to pursue further follow up.  You may also review your test results online through [facility]. If you do not have a MyChart account, instructions to sign up should be on your discharge paperwork.      Reviewed expectations re: course of current medical issues. Questions answered. Outlined signs and symptoms indicating need for more acute intervention. Patient verbalized understanding. After Visit Summary given.   SUBJECTIVE:  History from: patient. Kimberly Anderson is a 55 y.o. female who presents with complaint of palpitations; first noted over the past week. She reports no chest pain on exertion, no dyspnea on exertion, no swelling of ankles, no orthostatic dizziness or lightheadedness, no orthopnea or paroxysmal nocturnal dyspnea and no intermittent claudication symptoms. No h/o similar. Noted elevated BP at home; checks infrequently. Otherwise feeling well. Minimal caffeine use. Feels she is sleeping well.  Recent illnesses: none. Fever: absent. Ambulatory without assistance. Self/OTC treatment: none; no new medications. Illicit drug use: denied.  Social History   Tobacco Use  Smoking  Status Former Smoker  . Packs/day: 0.30  . Years: 35.00  . Pack years: 10.50  . Types: Cigarettes  . Quit date: 06/06/2010  . Years since quitting: 9.8  Smokeless Tobacco Never Used   Social History   Substance and Sexual Activity  Alcohol Use No    Increased blood pressure noted today. Reports that she is not currently treated.    OBJECTIVE:  Vitals:   04/01/20 1611 04/01/20 1614 04/01/20 1643  BP: (!) 159/93  (!) 150/86  Pulse: 80  78  Resp: 20    Temp: 98.3 F (36.8 C)    TempSrc: Oral    SpO2: 100% 100%   Weight:  111.1 kg   Height:  5\' 9"  (1.753 m)     General appearance: alert, oriented, no acute distress Eyes: PERRLA; EOMI; conjunctivae normal HENT: normocephalic; atraumatic Neck: supple with FROM Lungs: without labored respirations; speaks full sentences without difficulty; CTAB Heart: regular rate and rhythm without murmer Chest Wall: without tenderness to palpation Abdomen: soft, non-tender; no guarding or rebound tenderness Extremities: without edema; without calf swelling or tenderness; symmetrical without gross deformities Skin: warm and dry; without rash or lesions Neuro: normal gait Psychological: alert and cooperative; normal mood and affect   No Known Allergies  Past Medical History:  Diagnosis Date  . 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 Fibroids   . Gall stones   . Hypertension   . No pertinent past medical history   . Obesity    Social History   Socioeconomic History  . Marital status: Legally Separated    Spouse name: Not on  file  . Number of children: Not on file  . Years of education: Not on file  . Highest education level: Not on file  Occupational History  . Not on file  Tobacco Use  . Smoking status: Former Smoker    Packs/day: 0.30    Years: 35.00    Pack years: 10.50    Types: Cigarettes    Quit date: 06/06/2010    Years since quitting: 9.8  . Smokeless tobacco: Never Used  Substance and  Sexual Activity  . Alcohol use: No  . Drug use: No  . Sexual activity: Not Currently    Birth control/protection: Surgical  Other Topics Concern  . Not on file  Social History Narrative   Lives with son (chance), daughter (terri), and mother in [place].    Divorced. Sexually active with ex-husband occasionally; he's her only partner.    Works with [employer] ([employer]) and [employer].    Went to school through 11th grade.    Religion: none   Social Determinants of Radio broadcast assistant Strain: Not on file  Food Insecurity: Not on file  Transportation Needs: Not on file  Physical Activity: Not on file  Stress: Not on file  Social Connections: Not on file  Intimate Partner Violence: Not on file   Family History  Problem Relation Age of Onset  . Diabetes type II Mother        Not currently on insulin but had been at one time  . Hypertension Mother   . Hyperlipidemia Mother   . Coronary artery disease Mother        s/p stents  . Hypertension Father   . Hyperlipidemia Father   . Prostate cancer Father 73       s/p radiation  . Stroke Father 3  . Fibroids Sister        s/p fibroidectomy 2/2 dub  . Breast cancer Maternal Aunt    Past Surgical History:  Procedure Laterality Date  . ABDOMINAL HYSTERECTOMY  03/11/2011   Procedure: HYSTERECTOMY ABDOMINAL;  Surgeon: Juliene Pina C. Hulan Fray, MD;  Location: [facility];  Service: Gynecology;  Laterality: N/A;  Possible removal of tubes & ovaries  . Bilateral tubal ligation  1996  . CHOLECYSTECTOMY N/A 05/02/2015   Procedure: LAPAROSCOPIC CHOLECYSTECTOMY;  Surgeon: Ralene Ok, MD;  Location: [facility];  Service: General;  Laterality: N/A;  . SALPINGOOPHORECTOMY  03/11/2011   Procedure: SALPINGO OOPHERECTOMY;  Surgeon: Wilhemina Cash. Hulan Fray, MD;  Location: [facility];  Service: Gynecology;  Laterality: Bilateral;  . SVD     x 3     Vanessa Kick, MD 04/03/20 [phone]

## 2020-05-02 ENCOUNTER — Other Ambulatory Visit: Payer: Self-pay

## 2020-05-02 ENCOUNTER — Ambulatory Visit: Payer: 59 | Admitting: Family

## 2020-05-02 ENCOUNTER — Encounter: Payer: Self-pay | Admitting: Family

## 2020-05-02 ENCOUNTER — Ambulatory Visit (INDEPENDENT_AMBULATORY_CARE_PROVIDER_SITE_OTHER): Payer: 59 | Admitting: Family

## 2020-05-02 VITALS — BP 148/100 | HR 80 | Ht 69.49 in | Wt 238.6 lb

## 2020-05-02 DIAGNOSIS — M19071 Primary osteoarthritis, right ankle and foot: Secondary | ICD-10-CM

## 2020-05-02 DIAGNOSIS — M170 Bilateral primary osteoarthritis of knee: Secondary | ICD-10-CM

## 2020-05-02 DIAGNOSIS — E0590 Thyrotoxicosis, unspecified without thyrotoxic crisis or storm: Secondary | ICD-10-CM | POA: Diagnosis not present

## 2020-05-02 DIAGNOSIS — Z7689 Persons encountering health services in other specified circumstances: Secondary | ICD-10-CM

## 2020-05-02 DIAGNOSIS — M19072 Primary osteoarthritis, left ankle and foot: Secondary | ICD-10-CM

## 2020-05-02 NOTE — Patient Instructions (Addendum)
Return for annual physical examination, labs, and health maintenance. Arrive fasting meaning having had no food and/or nothing to drink for at least 8 hours prior to appointment.  Referral to Endocrinology for hyperthyroidism.   Referral to Orthopedics.   Increase Hydrochlorothiazide for high blood pressure. Follow-up within 2 weeks. Thank you for choosing Primary Care at [facility] for your medical home!    Kimberly Anderson was seen by Camillia Herter, NP today.   Kimberly Anderson's primary care provider is Ossie Yebra Zachery Dauer, NP.   For the best care possible,  you should try to see Durene Fruits, NP whenever you come to clinic.   We look forward to seeing you again soon!  If you have any questions about your visit today,  please call us at [phone]  Or feel free to reach your provider via [facility].   Hyperthyroidism  Hyperthyroidism is when the thyroid gland is too active (overactive). The thyroid gland is a small gland located in the lower front part of the neck, just in front of the windpipe (trachea). This gland makes hormones that help control how the body uses food for energy (metabolism) as well as how the heart and brain function. These hormones also play a role in keeping your bones strong. When the thyroid is overactive, it produces too much of a hormone called thyroxine. What are the causes? This condition may be caused by:  Graves' disease. This is a disorder in which the body's disease-fighting system (immune system) attacks the thyroid gland. This is the most common cause.  Inflammation of the thyroid gland.  A tumor in the thyroid gland.  Use of certain medicines, including: ? Prescription thyroid hormone replacement. ? Herbal supplements that mimic thyroid hormones. ? Amiodarone therapy.  Solid or fluid-filled lumps within your thyroid gland (thyroid nodules).  Taking in a large amount of iodine from foods or medicines. What increases the  risk? You are more likely to develop this condition if:  You are female.  You have a family history of thyroid conditions.  You smoke tobacco.  You use a medicine called lithium.  You take medicines that affect the immune system (immunosuppressants). What are the signs or symptoms? Symptoms of this condition include:  Nervousness.  Inability to tolerate heat.  Unexplained weight loss.  Diarrhea.  Change in the texture of hair or skin.  Heart skipping beats or making extra beats.  Rapid heart rate.  Loss of menstruation.  Shaky hands.  Fatigue.  Restlessness.  Sleep problems.  Enlarged thyroid gland or a lump in the thyroid (nodule). You may also have symptoms of Graves' disease, which may include:  Protruding eyes.  Dry eyes.  Red or swollen eyes.  Problems with vision. How is this diagnosed? This condition may be diagnosed based on:  Your symptoms and medical history.  A physical exam.  Blood tests.  Thyroid ultrasound. This test involves using sound waves to produce images of the thyroid gland.  A thyroid scan. A radioactive substance is injected into a vein, and images show how much iodine is present in the thyroid.  Radioactive iodine uptake test (RAIU). A small amount of radioactive iodine is given by mouth to see how much iodine the thyroid absorbs after a certain amount of time. How is this treated? Treatment depends on the cause and severity of the condition. Treatment may include:  Medicines to reduce the amount of thyroid hormone your body makes.  Radioactive iodine treatment (radioiodine therapy).  This involves swallowing a small dose of radioactive iodine, in capsule or liquid form, to kill thyroid cells.  Surgery to remove part or all of your thyroid gland. You may need to take thyroid hormone replacement medicine for the rest of your life after thyroid surgery.  Medicines to help manage your symptoms. Follow these instructions at  home:  Take over-the-counter and prescription medicines only as told by your health care provider.  Do not use any products that contain nicotine or tobacco, such as cigarettes and e-cigarettes. If you need help quitting, ask your health care provider.  Follow any instructions from your health care provider about diet. You may be instructed to limit foods that contain iodine.  Keep all follow-up visits as told by your health care provider. This is important. ? You will need to have blood tests regularly so that your health care provider can monitor your condition.   Contact a health care provider if:  Your symptoms do not get better with treatment.  You have a fever.  You are taking thyroid hormone replacement medicine and you: ? Have symptoms of depression. ? Feel like you are tired all the time. ? Gain weight. Get help right away if:  You have chest pain.  You have decreased alertness or a change in your awareness.  You have abdominal pain.  You feel dizzy.  You have a rapid heartbeat.  You have an irregular heartbeat.  You have difficulty breathing. Summary  The thyroid gland is a small gland located in the lower front part of the neck, just in front of the windpipe (trachea).  Hyperthyroidism is when the thyroid gland is too active (overactive) and produces too much of a hormone called thyroxine.  The most common cause is Graves' disease, a disorder in which your immune system attacks the thyroid gland.  Hyperthyroidism can cause various symptoms, such as unexplained weight loss, nervousness, inability to tolerate heat, or changes in your heartbeat.  Treatment may include medicine to reduce the amount of thyroid hormone your body makes, radioiodine therapy, surgery, or medicines to manage symptoms. This information is not intended to replace advice given to you by your health care provider. Make sure you discuss any questions you have with your health care  provider. Document Revised: 11/04/2019 Document Reviewed: 11/04/2019 Elsevier Patient Education  2021 [employer].

## 2020-05-02 NOTE — Progress Notes (Signed)
Subjective:    Kimberly Anderson - 55 y.o. female MRN [id number]  Date of birth: March 14, 1965  HPI  Kimberly Anderson is to establish care. Patient has a PMH significant for hot flashes, acute cholecystitis, cervical strain, anemia, fatigue, morbid obesity, neck pain, bilateral leg pain, iron deficiency anemia, and abdominal pain.   Current issues and/or concerns: 1. HYPERTENSION FOLLOW-UP: Currently taking: see medication list Have you taken your blood pressure medication today: [x]  Yes []  No  Med Adherence: [x]  Yes    []  No Medication side effects: []  Yes    [x]  No Adherence with salt restriction (low-salt diet): []  Yes    [x]  No Exercise: Yes [x]  No []  Home Monitoring?: []  Yes    [x]  No Monitoring Frequency: []  Yes    [x]  No Home BP results range: []  Yes    [x]  No  Smoking []  Yes [x]  No SOB? []  Yes    [x]  No  Chest Pain?: []  Yes    [x]  No Leg swelling?: [x]  Yes, sometimes  Headaches?: []  Yes    [x]  No Dizziness? []  Yes    [x]  No   2. HYPERTHYROIDISM: Visit on 04/01/2020 at the [facility] with palpitations and hypertension. Labs obtained which resulted with low TSH.  3. LEG AND ANKLE PAIN: Ongoing for some time. Had injections before which helped some but the pain returned. Would like referral back to Orthopedics.  ROS per HPI   Health Maintenance:  Health Maintenance Due  Topic Date Due  . Hepatitis C Screening  Never done  . COVID-19 Vaccine (1) Never done  . COLONOSCOPY (Pts 45-71yrs Insurance coverage will need to be confirmed)  Never done  . PAP SMEAR-Modifier  11/26/2013  . INFLUENZA VACCINE  10/03/2019    Past Medical History: Patient Active Problem List   Diagnosis Date Noted  . Osteoarthritis 03/17/2019  . Bilateral pes planus 10/29/2018  . Metatarsalgia of both feet 10/29/2018  . Onychomycosis due to dermatophyte 10/29/2018  . Acute cholecystitis 05/02/2015  . Abdominal pain 02/28/2012  . Anemia 01/09/2012  . Fatigue  01/09/2012  . Hot flashes 01/09/2012  . Morbid obesity ([place]) 01/09/2012  . Neck pain 01/09/2012  . Hot flashes due to surgical menopause 11/06/2011  . Pain in right knee 11/06/2011  . Iron deficiency anemia 11/06/2011  . Cervical strain 11/27/2010  . Preventative health care 11/06/2010    Social History   reports that she quit smoking about 9 years ago. Her smoking use included cigarettes. She has a 10.50 pack-year smoking history. She has never used smokeless tobacco. She reports that she does not drink alcohol and does not use drugs.   Family History  family history includes Breast cancer in her maternal aunt; Coronary artery disease in her mother; Diabetes type II in her mother; Fibroids in her sister; Hyperlipidemia in her father and mother; Hypertension in her father and mother; Prostate cancer (age of onset: 2) in her father; Stroke (age of onset: 8) in her father.   Medications: reviewed and updated   Objective:   Physical Exam BP (!) 148/100 (BP Location: Left Arm, Patient Position: Sitting)   Pulse 80   Ht 5' 9.49" (1.765 m)   Wt 238 lb 9.6 oz (108.2 kg)   LMP 03/05/2011   SpO2 98%   BMI 34.74 kg/m    Physical Exam Constitutional:      Appearance: She is obese.  HENT:     Head: Normocephalic and atraumatic.  Eyes:  Extraocular Movements: Extraocular movements intact.     Pupils: Pupils are equal, round, and reactive to light.  Cardiovascular:     Rate and Rhythm: Normal rate and regular rhythm.     Pulses: Normal pulses.  Pulmonary:     Effort: Pulmonary effort is normal.     Breath sounds: Normal breath sounds.  Musculoskeletal:     Cervical back: Normal range of motion and neck supple.  Neurological:     General: No focal deficit present.     Mental Status: She is alert and oriented to person, place, and time.  Psychiatric:        Mood and Affect: Mood normal.        Behavior: Behavior normal.      Assessment & Plan:  1. Encounter to establish  care: - Patient presents today to establish care.  - Return for annual physical examination, labs, and health maintenance. Arrive fasting meaning having had no food and/or nothing to drink for at least 8 hours prior to appointment.  2. Hyperthyroidism: - New-onset.  - Last TSH obtained 04/01/2020. - Referral to Endocrinology for further evaluation and management. - Ambulatory referral to Endocrinology  3. Osteoarthritis of both knees, unspecified osteoarthritis type:  4. Osteoarthritis of both ankles, unspecified osteoarthritis type: - Chronic.  - Patient had injections in the past which helped. - Referral to Orthopedic Surgery for further evaluation and management. - Ambulatory referral to Orthopedic Surgery   Patient was given clear instructions to go to Emergency Department or return to medical center if symptoms don't improve, worsen, or new problems develop.The patient verbalized understanding.  I discussed the assessment and treatment plan with the patient. The patient was provided an opportunity to ask questions and all were answered. The patient agreed with the plan and demonstrated an understanding of the instructions.   The patient was advised to call back or seek an in-person evaluation if the symptoms worsen or if the condition fails to improve as anticipated.    Durene Fruits, NP 05/03/2020, 1:32 PM Primary Care at [facility]

## 2020-05-02 NOTE — Progress Notes (Signed)
Establish care Needs meloxicam, phentermine, VIT d, HCTZ

## 2020-05-09 ENCOUNTER — Ambulatory Visit: Payer: 59

## 2020-05-09 ENCOUNTER — Ambulatory Visit
Admission: RE | Admit: 2020-05-09 | Discharge: 2020-05-09 | Disposition: A | Discharge status: Home / Self Care | Payer: 59 | Source: Ambulatory Visit | Attending: Internal Medicine | Admitting: Internal Medicine

## 2020-05-09 ENCOUNTER — Other Ambulatory Visit: Payer: Self-pay

## 2020-05-09 DIAGNOSIS — N6310 Unspecified lump in the right breast, unspecified quadrant: Secondary | ICD-10-CM

## 2020-05-27 ENCOUNTER — Emergency Department (HOSPITAL_COMMUNITY)
Admission: EM | Admit: 2020-05-27 | Discharge: 2020-05-27 | Discharge status: Against Medical Advice | Payer: 59 | Attending: Emergency Medicine | Admitting: Emergency Medicine

## 2020-05-27 ENCOUNTER — Other Ambulatory Visit: Payer: Self-pay

## 2020-05-27 ENCOUNTER — Encounter (HOSPITAL_COMMUNITY): Payer: Self-pay | Admitting: Emergency Medicine

## 2020-05-27 DIAGNOSIS — Z5321 Procedure and treatment not carried out due to patient leaving prior to being seen by health care provider: Secondary | ICD-10-CM | POA: Insufficient documentation

## 2020-05-27 DIAGNOSIS — R519 Headache, unspecified: Secondary | ICD-10-CM | POA: Insufficient documentation

## 2020-05-27 DIAGNOSIS — K0889 Other specified disorders of teeth and supporting structures: Secondary | ICD-10-CM | POA: Diagnosis present

## 2020-05-27 NOTE — ED Provider Notes (Cosign Needed)
Patient eloped from the emergency department before provider evaluation. I did not see or evaluate this patient. Triage note states patient reports left-sided dental pain and headache that started suddenly a few hours prior to arrival.  Vitals noted that she was hypertensive on arrival.  Discussed with RN who reports the patient left almost an hour ago stating that she did not want to wait but had not yet had time to move her out of the system.    Abigail Butts, Hershal Coria 05/27/20 2301

## 2020-05-27 NOTE — ED Notes (Signed)
Patient states she wants to leave because she does not want to wait any longer. Patient states we were taking to long.

## 2020-05-27 NOTE — ED Triage Notes (Signed)
Pt reports L sided dental pain and headache that started suddenly a few hour ago. A&Ox4.

## 2020-05-28 ENCOUNTER — Encounter (HOSPITAL_COMMUNITY): Payer: Self-pay | Admitting: *Deleted

## 2020-05-28 ENCOUNTER — Other Ambulatory Visit: Payer: Self-pay

## 2020-05-28 ENCOUNTER — Ambulatory Visit (HOSPITAL_COMMUNITY)
Admission: EM | Admit: 2020-05-28 | Discharge: 2020-05-28 | Disposition: A | Discharge status: Home / Self Care | Payer: 59 | Attending: Student | Admitting: Student

## 2020-05-28 DIAGNOSIS — K047 Periapical abscess without sinus: Secondary | ICD-10-CM

## 2020-05-28 HISTORY — DX: Thyrotoxicosis, unspecified without thyrotoxic crisis or storm: E05.90

## 2020-05-28 HISTORY — DX: Disorder of thyroid, unspecified: E07.9

## 2020-05-28 MED ORDER — AMOXICILLIN-POT CLAVULANATE 875-125 MG PO TABS
1.0000 | ORAL_TABLET | Freq: Two times a day (BID) | ORAL | 0 refills | Status: DC
Start: 2020-05-28 — End: 2021-03-16

## 2020-05-28 MED ORDER — LIDOCAINE VISCOUS HCL 2 % MT SOLN
15.0000 mL | OROMUCOSAL | 0 refills | Status: DC | PRN
Start: 2020-05-28 — End: 2021-12-31

## 2020-05-28 NOTE — ED Provider Notes (Signed)
Kimberly Anderson    CSN: [id number] Arrival date & time: 05/28/20  1253      History   Chief Complaint Chief Complaint  Patient presents with  . Dental Pain    HPI Kimberly Anderson is a 55 y.o. female presenting with dental pain. Left the ED without being seen for this earlier today. History drug abuse, clean since 2007 per pt. States she's having pain of L upper and lower teeth. Denies fevers, foul taste in mouth.   HPI  Past Medical History:  Diagnosis Date  . 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 Fibroids   . Gall stones   . Hypertension   . Hyperthyroidism   . Obesity   . Thyroid disease     Patient Active Problem List   Diagnosis Date Noted  . Osteoarthritis 03/17/2019  . Bilateral pes planus 10/29/2018  . Metatarsalgia of both feet 10/29/2018  . Onychomycosis due to dermatophyte 10/29/2018  . Acute cholecystitis 05/02/2015  . Abdominal pain 02/28/2012  . Anemia 01/09/2012  . Fatigue 01/09/2012  . Hot flashes 01/09/2012  . Morbid obesity ([place]) 01/09/2012  . Neck pain 01/09/2012  . Hot flashes due to surgical menopause 11/06/2011  . Pain in right knee 11/06/2011  . Iron deficiency anemia 11/06/2011  . Cervical strain 11/27/2010  . Preventative health care 11/06/2010    Past Surgical History:  Procedure Laterality Date  . ABDOMINAL HYSTERECTOMY  03/11/2011   Procedure: HYSTERECTOMY ABDOMINAL;  Surgeon: Juliene Pina C. Hulan Fray, MD;  Location: [facility];  Service: Gynecology;  Laterality: N/A;  Possible removal of tubes & ovaries  . Bilateral tubal ligation  1996  . CHOLECYSTECTOMY N/A 05/02/2015   Procedure: LAPAROSCOPIC CHOLECYSTECTOMY;  Surgeon: Ralene Ok, MD;  Location: [facility];  Service: General;  Laterality: N/A;  . SALPINGOOPHORECTOMY  03/11/2011   Procedure: SALPINGO OOPHERECTOMY;  Surgeon: Wilhemina Cash. Hulan Fray, MD;  Location: [facility];  Service: Gynecology;  Laterality: Bilateral;  . SVD     x 3  . TUBAL LIGATION N/A    Phreesia  04/30/2020    OB History    Gravida  4   Para  3   Term  3   Preterm  0   AB  1   Living        SAB  1   IAB      Ectopic      Multiple      Live Births               Home Medications    Prior to Admission medications   Medication Sig Start Date End Date Taking? Authorizing Provider  amoxicillin-clavulanate (AUGMENTIN) 875-125 MG tablet Take 1 tablet by mouth every 12 (twelve) hours. 05/28/20  Yes Hazel Sams, PA-C  ergocalciferol (VITAMIN D2) 1.25 MG (50000 UT) capsule ergocalciferol (vitamin D2) 1,250 mcg (50,000 unit) capsule   Yes [provider]  hydrochlorothiazide (HYDRODIURIL) 25 MG tablet Take 25 mg by mouth daily. 03/04/17  Yes [provider]  lidocaine (XYLOCAINE) 2 % solution Use as directed 15 mLs in the mouth or throat as needed for mouth pain. 05/28/20  Yes Hazel Sams, PA-C  meloxicam [facility]) 15 MG tablet Take by mouth. 10/29/18  Yes [provider]  Multiple Vitamin (MULTIVITAMIN) capsule Take 1 capsule by mouth daily.   Yes [provider]  phentermine 37.5 MG capsule Take 37.5 mg by mouth every morning.    [provider]  terbinafine (LAMISIL) 250 MG tablet terbinafine HCl 250 mg tablet  TAKE 1 TABLET BY MOUTH EVERY DAY 10/29/18   [provider]    Family History Family History  Problem Relation Age of Onset  . Diabetes type II Mother        Not currently on insulin but had been at one time  . Hypertension Mother   . Hyperlipidemia Mother   . Coronary artery disease Mother        s/p stents  . Hypertension Father   . Hyperlipidemia Father   . Prostate cancer Father 108       s/p radiation  . Stroke Father 43  . Fibroids Sister        s/p fibroidectomy 2/2 dub  . Breast cancer Maternal Aunt     Social History Social History   Tobacco Use  . Smoking status: Former Smoker    Packs/day: 0.30    Years: 35.00    Pack years: 10.50    Types: Cigarettes    Quit date:  06/06/2010    Years since quitting: 9.9  . Smokeless tobacco: Never Used  Vaping Use  . Vaping Use: Never used  Substance Use Topics  . Alcohol use: No  . Drug use: No     Allergies   Patient has no known allergies.   Review of Systems Review of Systems  HENT: Positive for dental problem.   All other systems reviewed and are negative.    Physical Exam Triage Vital Signs ED Triage Vitals [05/28/20 1301]  Enc Vitals Group     BP (!) 147/95     Pulse Rate 76     Resp 16     Temp 98.6 F (37 C)     Temp Source Temporal     SpO2 100 %     Weight      Height      Head Circumference      Peak Flow      Pain Score      Pain Loc      Pain Edu?      Excl. in [place]?    No data found.  Updated Vital Signs BP (!) 147/95   Pulse 76   Temp 98.6 F (37 C) (Temporal)   Resp 16   LMP 03/05/2011   SpO2 100%   Visual Acuity Right Eye Distance:   Left Eye Distance:   Bilateral Distance:    Right Eye Near:   Left Eye Near:    Bilateral Near:     Physical Exam Vitals reviewed.  Constitutional:      General: She is not in acute distress.    Appearance: Normal appearance. She is not ill-appearing, toxic-appearing or diaphoretic.  HENT:     Head: Normocephalic and atraumatic.     Jaw: There is normal jaw occlusion. No trismus, tenderness, swelling, pain on movement or malocclusion.     Salivary Glands: Right salivary gland is not diffusely enlarged or tender. Left salivary gland is not diffusely enlarged or tender.     Right Ear: Hearing normal.     Left Ear: Hearing normal.     Nose: Nose normal.     Mouth/Throat:     Lips: Pink.     Mouth: Mucous membranes are moist. No lacerations or oral lesions.     Dentition: Abnormal dentition. Does not have dentures. Dental tenderness, dental caries and dental abscesses present.     Tongue: No lesions. Tongue does not  deviate from midline.     Palate: No mass.     Pharynx: Oropharynx is clear. Uvula midline. No oropharyngeal  exudate or posterior oropharyngeal erythema.     Tonsils: No tonsillar exudate or tonsillar abscesses.      Comments: Poor dentician  Swelling and tenderness posterior R gums Eyes:     Extraocular Movements: Extraocular movements intact.     Pupils: Pupils are equal, round, and reactive to light.  Pulmonary:     Effort: Pulmonary effort is normal.  Neurological:     General: No focal deficit present.     Mental Status: She is alert and oriented to person, place, and time.  Psychiatric:        Mood and Affect: Mood normal.        Behavior: Behavior normal.        Thought Content: Thought content normal.        Judgment: Judgment normal.      UC Treatments / Results  Labs (all labs ordered are listed, but only abnormal results are displayed) Labs Reviewed - No data to display  EKG   Radiology No results found.  Procedures Procedures (including critical care time)  Medications Ordered in UC Medications - No data to display  Initial Impression / Assessment and Plan / UC Course  I have reviewed the triage vital signs and the nursing notes.  Pertinent labs & imaging results that were available during my care of the patient were reviewed by me and considered in my medical decision making (see chart for details).     This patient is a 55 year old female presenting with dental pain. Today this pt is afebrile nontachycardic nontachypneic, oxygenating well on room air, no wheezes rhonchi or rales.   Plan to treat with augmentin and viscous lidocaine as below.   Return precautions discussed.  This chart was dictated using voice recognition software, Dragon. Despite the best efforts of this provider to proofread and correct errors, errors may still occur which can change documentation meaning.  Final Clinical Impressions(s) / UC Diagnoses   Final diagnoses:  Dental infection     Discharge Instructions     -Start the antibiotic-Augmentin (amoxicillin-clavulanate), 1  pill every 12 hours for 7 days.  You can take this with food like with breakfast and dinner. -For dental pain, use lidocaine mouthwash up to every 4 hours. Make sure not to eat for at least 1 hour after using this, as your mouth will be very numb and you could bite yourself. Take Tylenol 1000 mg 3 times daily, and ibuprofen 800 mg 3 times daily with food.  You can take these together, or alternate every 3-4 hours. -Follow-up with dentist if symptoms worsen or persist. You can alternatively head to the ED.      ED Prescriptions    Medication Sig Dispense Auth. Provider   amoxicillin-clavulanate (AUGMENTIN) 875-125 MG tablet Take 1 tablet by mouth every 12 (twelve) hours. 14 tablet Marin Roberts E, PA-C   lidocaine (XYLOCAINE) 2 % solution Use as directed 15 mLs in the mouth or throat as needed for mouth pain. 100 mL Hazel Sams, PA-C     I have reviewed the PDMP during this encounter.   Hazel Sams, PA-C 05/28/20 1328

## 2020-05-28 NOTE — ED Triage Notes (Signed)
Per pt, started with left upper and lower dental pain last night.  Denies fevers.

## 2020-05-28 NOTE — Discharge Instructions (Addendum)
-  Start the antibiotic-Augmentin (amoxicillin-clavulanate), 1 pill every 12 hours for 7 days.  You can take this with food like with breakfast and dinner. -For dental pain, use lidocaine mouthwash up to every 4 hours. Make sure not to eat for at least 1 hour after using this, as your mouth will be very numb and you could bite yourself. Take Tylenol 1000 mg 3 times daily, and ibuprofen 800 mg 3 times daily with food.  You can take these together, or alternate every 3-4 hours. -Follow-up with dentist if symptoms worsen or persist. You can alternatively head to the ED.

## 2020-05-31 ENCOUNTER — Encounter: Payer: 59 | Admitting: Family

## 2020-05-31 NOTE — Progress Notes (Signed)
Patient did not show for appointment.   

## 2020-06-06 ENCOUNTER — Encounter: Payer: 59 | Admitting: Family

## 2020-06-13 ENCOUNTER — Ambulatory Visit (INDEPENDENT_AMBULATORY_CARE_PROVIDER_SITE_OTHER): Payer: 59 | Admitting: Endocrinology

## 2020-06-13 ENCOUNTER — Encounter: Payer: Self-pay | Admitting: Endocrinology

## 2020-06-13 ENCOUNTER — Other Ambulatory Visit: Payer: Self-pay

## 2020-06-13 DIAGNOSIS — E0590 Thyrotoxicosis, unspecified without thyrotoxic crisis or storm: Secondary | ICD-10-CM | POA: Diagnosis not present

## 2020-06-13 LAB — TSH: TSH: 0.38 u[IU]/mL (ref 0.35–4.50)

## 2020-06-13 LAB — T4, FREE: Free T4: 0.87 ng/dL (ref 0.60–1.60)

## 2020-06-13 NOTE — Progress Notes (Signed)
Subjective:    Patient ID: Kimberly Anderson, female    DOB: 03-01-1966, 55 y.o.   MRN: [id number]  HPI Pt is referred by Durene Fruits, NP, for hyperthyroidism.  Pt reports he was dx'ed with hyperthyroidism in early 2022.  she has never been on therapy for this.  she has never had XRT to the anterior neck, or thyroid surgery.  she has never had thyroid imaging.  she does not consume kelp or any other non-prescribed thyroid medication.  she has never been on amiodarone.  Pt reports palpitations, sweating, fatigue, and heat intolerance.   Past Medical History:  Diagnosis Date  . 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 Fibroids   . Gall stones   . Hypertension   . Hyperthyroidism   . Obesity   . Thyroid disease     Past Surgical History:  Procedure Laterality Date  . ABDOMINAL HYSTERECTOMY  03/11/2011   Procedure: HYSTERECTOMY ABDOMINAL;  Surgeon: Juliene Pina C. Hulan Fray, MD;  Location: [facility];  Service: Gynecology;  Laterality: N/A;  Possible removal of tubes & ovaries  . Bilateral tubal ligation  1996  . CHOLECYSTECTOMY N/A 05/02/2015   Procedure: LAPAROSCOPIC CHOLECYSTECTOMY;  Surgeon: Ralene Ok, MD;  Location: [facility];  Service: General;  Laterality: N/A;  . SALPINGOOPHORECTOMY  03/11/2011   Procedure: SALPINGO OOPHERECTOMY;  Surgeon: Wilhemina Cash. Hulan Fray, MD;  Location: [facility];  Service: Gynecology;  Laterality: Bilateral;  . SVD     x 3  . TUBAL LIGATION N/A    Phreesia 04/30/2020    Social History   Socioeconomic History  . Marital status: Legally Separated    Spouse name: Not on file  . Number of children: Not on file  . Years of education: Not on file  . Highest education level: Not on file  Occupational History  . Not on file  Tobacco Use  . Smoking status: Former Smoker    Packs/day: 0.30    Years: 35.00    Pack years: 10.50    Types: Cigarettes    Quit date: 06/06/2010    Years since quitting: 10.0  . Smokeless tobacco: Never Used  Vaping Use  .  Vaping Use: Never used  Substance and Sexual Activity  . Alcohol use: No  . Drug use: No  . Sexual activity: Not Currently    Birth control/protection: Surgical  Other Topics Concern  . Not on file  Social History Narrative   Lives with son (chance), daughter (terri), and mother in [place].    Divorced. Sexually active with ex-husband occasionally; he's her only partner.    Works with [employer] ([employer]) and [employer].    Went to school through 11th grade.    Religion: none   Social Determinants of Radio broadcast assistant Strain: Not on file  Food Insecurity: Not on file  Transportation Needs: Not on file  Physical Activity: Not on file  Stress: Not on file  Social Connections: Not on file  Intimate Partner Violence: Not on file    Current Outpatient Medications on File Prior to Visit  Medication Sig Dispense Refill  . amoxicillin-clavulanate (AUGMENTIN) 875-125 MG tablet Take 1 tablet by mouth every 12 (twelve) hours. 14 tablet 0  . ergocalciferol (VITAMIN D2) 1.25 MG (50000 UT) capsule ergocalciferol (vitamin D2) 1,250 mcg (50,000 unit) capsule    . hydrochlorothiazide (HYDRODIURIL) 25 MG tablet Take 25 mg by mouth daily.  5  . lidocaine (XYLOCAINE) 2 % solution  Use as directed 15 mLs in the mouth or throat as needed for mouth pain. 100 mL 0  . meloxicam (MOBIC) 15 MG tablet Take by mouth.    . Multiple Vitamin (MULTIVITAMIN) capsule Take 1 capsule by mouth daily.    . phentermine 37.5 MG capsule Take 37.5 mg by mouth every morning.    . terbinafine (LAMISIL) 250 MG tablet terbinafine HCl 250 mg tablet  TAKE 1 TABLET BY MOUTH EVERY DAY     No current facility-administered medications on file prior to visit.    No Known Allergies  Family History  Problem Relation Age of Onset  . Diabetes type II Mother        Not currently on insulin but had been at one time  . Hypertension Mother   . Hyperlipidemia Mother   . Coronary artery disease Mother         s/p stents  . Hypertension Father   . Hyperlipidemia Father   . Prostate cancer Father 59       s/p radiation  . Stroke Father 1  . Fibroids Sister        s/p fibroidectomy 2/2 dub  . Breast cancer Maternal Aunt   . Thyroid disease Daughter     BP 130/80 (BP Location: Right Arm, Patient Position: Sitting, Cuff Size: Large)   Pulse 79   Ht 5\' 9"  (1.753 m)   Wt 243 lb 3.2 oz (110.3 kg)   LMP 03/05/2011   SpO2 96%   BMI 35.91 kg/m     Review of Systems denies weight loss, sob, tremor, anxiety.       Objective:   Physical Exam VS: see vs page GEN: no distress HEAD: head: no deformity eyes: no periorbital swelling, no proptosis external nose and ears are normal NECK: supple, thyroid is not enlarged CHEST WALL: no deformity LUNGS: clear to auscultation CV: reg rate and rhythm, no murmur.  MUSCULOSKELETAL: gait is normal and steady EXTEMITIES: no deformity.  no leg edema NEURO:  readily moves all 4's.  sensation is intact to touch on all 4's.  No tremor.  SKIN:  Normal texture and temperature.  No rash or suspicious lesion is visible.  Not diaphoretic.   NODES:  None palpable at the neck PSYCH: alert, well-oriented.  Does not appear anxious nor depressed.   Lab Results  Component Value Date   TSH 0.289 (L) 04/01/2020   I have reviewed outside records, and summarized: Pt was noted to have low TSH, and referred here.  She was seen in ER several times for dental infection.      Assessment & Plan:  Hyperthyroidism, new to me, uncertain etiology and prognosis.  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  We'll let you know about the results.  If it is overactive again, I will send a prescription to your pharmacy, to slow it down to normal. Please come back for a follow-up appointment in 6 weeks.       Hyperthyroidism  Hyperthyroidism is when the thyroid gland is too active (overactive). The thyroid gland is a  small gland located in the lower front part of the neck, just in front of the windpipe (trachea). This gland makes hormones that help control how the body uses food for energy (metabolism) as well as how the heart and brain function. These hormones also play a role in keeping your bones strong. When the thyroid is overactive, it produces  too much of a hormone called thyroxine. What are the causes? This condition may be caused by:  Graves' disease. This is a disorder in which the body's disease-fighting system (immune system) attacks the thyroid gland. This is the most common cause.  Inflammation of the thyroid gland.  A tumor in the thyroid gland.  Use of certain medicines, including: ? Prescription thyroid hormone replacement. ? Herbal supplements that mimic thyroid hormones. ? Amiodarone therapy.  Solid or fluid-filled lumps within your thyroid gland (thyroid nodules).  Taking in a large amount of iodine from foods or medicines. What increases the risk? You are more likely to develop this condition if:  You are female.  You have a family history of thyroid conditions.  You smoke tobacco.  You use a medicine called lithium.  You take medicines that affect the immune system (immunosuppressants). What are the signs or symptoms? Symptoms of this condition include:  Nervousness.  Inability to tolerate heat.  Unexplained weight loss.  Diarrhea.  Change in the texture of hair or skin.  Heart skipping beats or making extra beats.  Rapid heart rate.  Loss of menstruation.  Shaky hands.  Fatigue.  Restlessness.  Sleep problems.  Enlarged thyroid gland or a lump in the thyroid (nodule). You may also have symptoms of Graves' disease, which may include:  Protruding eyes.  Dry eyes.  Red or swollen eyes.  Problems with vision. How is this diagnosed? This condition may be diagnosed based on:  Your symptoms and medical history.  A physical exam.  Blood  tests.  Thyroid ultrasound. This test involves using sound waves to produce images of the thyroid gland.  A thyroid scan. A radioactive substance is injected into a vein, and images show how much iodine is present in the thyroid.  Radioactive iodine uptake test (RAIU). A small amount of radioactive iodine is given by mouth to see how much iodine the thyroid absorbs after a certain amount of time. How is this treated? Treatment depends on the cause and severity of the condition. Treatment may include:  Medicines to reduce the amount of thyroid hormone your body makes.  Radioactive iodine treatment (radioiodine therapy). This involves swallowing a small dose of radioactive iodine, in capsule or liquid form, to kill thyroid cells.  Surgery to remove part or all of your thyroid gland. You may need to take thyroid hormone replacement medicine for the rest of your life after thyroid surgery.  Medicines to help manage your symptoms. Follow these instructions at home:  Take over-the-counter and prescription medicines only as told by your health care provider.  Do not use any products that contain nicotine or tobacco, such as cigarettes and e-cigarettes. If you need help quitting, ask your health care provider.  Follow any instructions from your health care provider about diet. You may be instructed to limit foods that contain iodine.  Keep all follow-up visits as told by your health care provider. This is important. ? You will need to have blood tests regularly so that your health care provider can monitor your condition.   Contact a health care provider if:  Your symptoms do not get better with treatment.  You have a fever.  You are taking thyroid hormone replacement medicine and you: ? Have symptoms of depression. ? Feel like you are tired all the time. ? Gain weight. Get help right away if:  You have chest pain.  You have decreased alertness or a change in your awareness.  You  have abdominal  pain.  You feel dizzy.  You have a rapid heartbeat.  You have an irregular heartbeat.  You have difficulty breathing. Summary  The thyroid gland is a small gland located in the lower front part of the neck, just in front of the windpipe (trachea).  Hyperthyroidism is when the thyroid gland is too active (overactive) and produces too much of a hormone called thyroxine.  The most common cause is Graves' disease, a disorder in which your immune system attacks the thyroid gland.  Hyperthyroidism can cause various symptoms, such as unexplained weight loss, nervousness, inability to tolerate heat, or changes in your heartbeat.  Treatment may include medicine to reduce the amount of thyroid hormone your body makes, radioiodine therapy, surgery, or medicines to manage symptoms. This information is not intended to replace advice given to you by your health care provider. Make sure you discuss any questions you have with your health care provider. Document Revised: 11/04/2019 Document Reviewed: 11/04/2019 Elsevier Patient Education  2021 [employer].

## 2020-06-13 NOTE — Patient Instructions (Addendum)
Blood tests are requested for you today.  We'll let you know about the results.  If it is overactive again, I will send a prescription to your pharmacy, to slow it down to normal. Please come back for a follow-up appointment in 6 weeks.       Hyperthyroidism  Hyperthyroidism is when the thyroid gland is too active (overactive). The thyroid gland is a small gland located in the lower front part of the neck, just in front of the windpipe (trachea). This gland makes hormones that help control how the body uses food for energy (metabolism) as well as how the heart and brain function. These hormones also play a role in keeping your bones strong. When the thyroid is overactive, it produces too much of a hormone called thyroxine. What are the causes? This condition may be caused by:  Graves' disease. This is a disorder in which the body's disease-fighting system (immune system) attacks the thyroid gland. This is the most common cause.  Inflammation of the thyroid gland.  A tumor in the thyroid gland.  Use of certain medicines, including: ? Prescription thyroid hormone replacement. ? Herbal supplements that mimic thyroid hormones. ? Amiodarone therapy.  Solid or fluid-filled lumps within your thyroid gland (thyroid nodules).  Taking in a large amount of iodine from foods or medicines. What increases the risk? You are more likely to develop this condition if:  You are female.  You have a family history of thyroid conditions.  You smoke tobacco.  You use a medicine called lithium.  You take medicines that affect the immune system (immunosuppressants). What are the signs or symptoms? Symptoms of this condition include:  Nervousness.  Inability to tolerate heat.  Unexplained weight loss.  Diarrhea.  Change in the texture of hair or skin.  Heart skipping beats or making extra beats.  Rapid heart rate.  Loss of menstruation.  Shaky  hands.  Fatigue.  Restlessness.  Sleep problems.  Enlarged thyroid gland or a lump in the thyroid (nodule). You may also have symptoms of Graves' disease, which may include:  Protruding eyes.  Dry eyes.  Red or swollen eyes.  Problems with vision. How is this diagnosed? This condition may be diagnosed based on:  Your symptoms and medical history.  A physical exam.  Blood tests.  Thyroid ultrasound. This test involves using sound waves to produce images of the thyroid gland.  A thyroid scan. A radioactive substance is injected into a vein, and images show how much iodine is present in the thyroid.  Radioactive iodine uptake test (RAIU). A small amount of radioactive iodine is given by mouth to see how much iodine the thyroid absorbs after a certain amount of time. How is this treated? Treatment depends on the cause and severity of the condition. Treatment may include:  Medicines to reduce the amount of thyroid hormone your body makes.  Radioactive iodine treatment (radioiodine therapy). This involves swallowing a small dose of radioactive iodine, in capsule or liquid form, to kill thyroid cells.  Surgery to remove part or all of your thyroid gland. You may need to take thyroid hormone replacement medicine for the rest of your life after thyroid surgery.  Medicines to help manage your symptoms. Follow these instructions at home:  Take over-the-counter and prescription medicines only as told by your health care provider.  Do not use any products that contain nicotine or tobacco, such as cigarettes and e-cigarettes. If you need help quitting, ask your health care provider.  Follow  any instructions from your health care provider about diet. You may be instructed to limit foods that contain iodine.  Keep all follow-up visits as told by your health care provider. This is important. ? You will need to have blood tests regularly so that your health care provider can monitor  your condition.   Contact a health care provider if:  Your symptoms do not get better with treatment.  You have a fever.  You are taking thyroid hormone replacement medicine and you: ? Have symptoms of depression. ? Feel like you are tired all the time. ? Gain weight. Get help right away if:  You have chest pain.  You have decreased alertness or a change in your awareness.  You have abdominal pain.  You feel dizzy.  You have a rapid heartbeat.  You have an irregular heartbeat.  You have difficulty breathing. Summary  The thyroid gland is a small gland located in the lower front part of the neck, just in front of the windpipe (trachea).  Hyperthyroidism is when the thyroid gland is too active (overactive) and produces too much of a hormone called thyroxine.  The most common cause is Graves' disease, a disorder in which your immune system attacks the thyroid gland.  Hyperthyroidism can cause various symptoms, such as unexplained weight loss, nervousness, inability to tolerate heat, or changes in your heartbeat.  Treatment may include medicine to reduce the amount of thyroid hormone your body makes, radioiodine therapy, surgery, or medicines to manage symptoms. This information is not intended to replace advice given to you by your health care provider. Make sure you discuss any questions you have with your health care provider. Document Revised: 11/04/2019 Document Reviewed: 11/04/2019 Elsevier Patient Education  2021 [employer].

## 2020-06-14 ENCOUNTER — Telehealth: Payer: Self-pay | Admitting: Endocrinology

## 2020-06-14 NOTE — Telephone Encounter (Signed)
Patient called to ask if based on her labs are Thyroid medications indicated or not?  Please all patient at [phone]

## 2020-06-14 NOTE — Telephone Encounter (Signed)
No medication is needed now.

## 2020-06-15 NOTE — Telephone Encounter (Signed)
Spoke with pt to let her know that no medication was needed at this time.

## 2020-06-26 ENCOUNTER — Other Ambulatory Visit: Payer: Self-pay

## 2020-06-26 ENCOUNTER — Ambulatory Visit: Payer: 59 | Admitting: Family Medicine

## 2020-07-27 ENCOUNTER — Ambulatory Visit: Payer: 59 | Admitting: Endocrinology

## 2020-12-21 NOTE — Progress Notes (Signed)
Patient ID: Kimberly Anderson, female    DOB: May 31, 1965  MRN: [id number]  CC: Annual Physical Exam   Subjective: [name] is a 55 y.o. female who presents for annual physical exam.   Her concerns today include:   HYPERTENSION: Currently taking: see medication list Med Adherence: [x] Yes    [] No Medication side effects: [] Yes    [x] No Adherence with salt restriction (low-salt diet): [x] Yes    [] No Exercise: Yes [x] No [] Home Monitoring?: [x] Yes    [] No Monitoring Frequency: [x] Yes    [] No Home BP results range: [x] Yes, 150's/90's Smoking [] Yes [x] No SOB? [] Yes    [x] No Chest Pain?: sometimes  Comments: requesting referral to Cardiology   2. BILATERAL KNEE PAIN FOLLOW-UP: Has not heard from Orthopedics referral, requesting new referral.   Patient Active Problem List   Diagnosis Date Noted   Hyperthyroidism 06/13/2020   Osteoarthritis 03/17/2019   Bilateral pes planus 10/29/2018   Metatarsalgia of both feet 10/29/2018   Onychomycosis due to dermatophyte 10/29/2018   Acute cholecystitis 05/02/2015   Abdominal pain 02/28/2012   Anemia 01/09/2012   Fatigue 01/09/2012   Hot flashes 01/09/2012   Morbid obesity ([place]) 01/09/2012   Neck pain 01/09/2012   Hot flashes due to surgical menopause 11/06/2011   Pain in right knee 11/06/2011   Iron deficiency anemia 11/06/2011   Cervical strain 11/27/2010   Preventative health care 11/06/2010     Current Outpatient Medications on File Prior to Visit  Medication Sig Dispense Refill   ergocalciferol (VITAMIN D2) 1.25 MG (50000 UT) capsule ergocalciferol (vitamin D2) 1,250 mcg (50,000 unit) capsule     meloxicam (MOBIC) 15 MG tablet Take by mouth.     Multiple Vitamin (MULTIVITAMIN) capsule Take 1 capsule by mouth daily.     amoxicillin-clavulanate (AUGMENTIN) 875-125 MG tablet Take 1 tablet by mouth every 12 (twelve) hours. (Patient not taking: Reported on 12/25/2020) 14 tablet 0   lidocaine (XYLOCAINE) 2  % solution Use as directed 15 mLs in the mouth or throat as needed for mouth pain. (Patient not taking: Reported on 12/25/2020) 100 mL 0   phentermine 37.5 MG capsule Take 37.5 mg by mouth every morning. (Patient not taking: Reported on 12/25/2020)     terbinafine (LAMISIL) 250 MG tablet terbinafine HCl 250 mg tablet  TAKE 1 TABLET BY MOUTH EVERY DAY (Patient not taking: Reported on 12/25/2020)     No current facility-administered medications on file prior to visit.    No Known Allergies  Social History   Socioeconomic History   Marital status: Legally Separated    Spouse name: Not on file   Number of children: Not on file   Years of education: Not on file   Highest education level: Not on file  Occupational History   Not on file  Tobacco Use   Smoking status: Former    Packs/day: 0.30    Years: 35.00    Pack years: 10.50    Types: Cigarettes    Quit date: 06/06/2010    Years since quitting: 10.5   Smokeless tobacco: Never  Vaping Use   Vaping Use: Never used  Substance and Sexual Activity   Alcohol use: No   Drug use: No   Sexual activity: Not Currently    Birth control/protection: Surgical  Other Topics Concern   Not on file  Social History Narrative   Lives with son (chance), daughter Kimberly Anderson),  and mother in [place].    Divorced. Sexually active with ex-husband occasionally; he's her only partner.    Works with [employer] ([employer]) and [employer].    Went to school through 11th grade.    Religion: none   Social Determinants of Radio broadcast assistant Strain: Not on file  Food Insecurity: Not on file  Transportation Needs: Not on file  Physical Activity: Not on file  Stress: Not on file  Social Connections: Not on file  Intimate Partner Violence: Not on file    Family History  Problem Relation Age of Onset   Diabetes type II Mother        Not currently on insulin but had been at one time   Hypertension Mother    Hyperlipidemia Mother     Coronary artery disease Mother        s/p stents   Hypertension Father    Hyperlipidemia Father    Prostate cancer Father 76       s/p radiation   Stroke Father 61   Fibroids Sister        s/p fibroidectomy 2/2 dub   Breast cancer Maternal Aunt    Thyroid disease Daughter     Past Surgical History:  Procedure Laterality Date   ABDOMINAL HYSTERECTOMY  03/11/2011   Procedure: HYSTERECTOMY ABDOMINAL;  Surgeon: Juliene Pina C. Hulan Fray, MD;  Location: [facility];  Service: Gynecology;  Laterality: N/A;  Possible removal of tubes & ovaries   Bilateral tubal ligation  1996   CHOLECYSTECTOMY N/A 05/02/2015   Procedure: LAPAROSCOPIC CHOLECYSTECTOMY;  Surgeon: Ralene Ok, MD;  Location: [facility];  Service: General;  Laterality: N/A;   SALPINGOOPHORECTOMY  03/11/2011   Procedure: SALPINGO OOPHERECTOMY;  Surgeon: Wilhemina Cash. Hulan Fray, MD;  Location: [facility];  Service: Gynecology;  Laterality: Bilateral;   SVD     x 3   TUBAL LIGATION N/A    Phreesia 04/30/2020    ROS: Review of Systems Negative except as stated above  PHYSICAL EXAM: BP (!) 137/95   Pulse 69   Resp 16   Ht 5' 9" (1.753 m)   Wt 229 lb (103.9 kg)   LMP 03/05/2011   SpO2 97%   BMI 33.82 kg/m   Physical Exam HENT:     Head: Normocephalic and atraumatic.     Right Ear: Tympanic membrane, ear canal and external ear normal.     Left Ear: Tympanic membrane, ear canal and external ear normal.  Eyes:     Extraocular Movements: Extraocular movements intact.     Conjunctiva/sclera: Conjunctivae normal.     Pupils: Pupils are equal, round, and reactive to light.  Cardiovascular:     Rate and Rhythm: Normal rate and regular rhythm.     Pulses: Normal pulses.     Heart sounds: Normal heart sounds.  Pulmonary:     Effort: Pulmonary effort is normal.     Breath sounds: Normal breath sounds.  Chest:     Comments: Patient declined.  Abdominal:     General: Bowel sounds are normal.     Palpations: Abdomen is soft.  Genitourinary:    General:  Normal vulva.     Vagina: Normal.     Cervix: Normal.     Uterus: Normal.      Adnexa: Right adnexa normal and left adnexa normal.     Comments: Carilyn Goodpasture, RN present during exam. Musculoskeletal:        General: Normal range of motion.  Cervical back: Normal range of motion and neck supple.  Skin:    General: Skin is warm and dry.     Capillary Refill: Capillary refill takes less than 2 seconds.  Neurological:     General: No focal deficit present.     Mental Status: She is alert and oriented to person, place, and time.  Psychiatric:        Mood and Affect: Mood normal.        Behavior: Behavior normal.   ASSESSMENT AND PLAN: 1. Annual physical exam: - Counseled on 150 minutes of exercise per week as tolerated, healthy eating (including decreased daily intake of saturated fats, cholesterol, added sugars, sodium), STI prevention, and routine healthcare maintenance.  2. Screening for metabolic disorder: - JHE17+EYCX to check kidney function, liver function, and electrolyte balance.  - CMP14+EGFR  3. Screening for deficiency anemia: - CBC to screen for anemia. - CBC  4. Diabetes mellitus screening: - Hemoglobin A1c to screen for pre-diabetes/diabetes. - Hemoglobin A1c  5. Screening cholesterol level: - Lipid panel to screen for high cholesterol.  - Lipid panel  6. Need for hepatitis C screening test: - Hepatitis C antibody to screen for hepatitis C.  - Hepatitis C Antibody  7. Colon cancer screening: - Referral to Gastroenterology for colon cancer screening by colonoscopy. - Ambulatory referral to Gastroenterology  8. Pap smear for cervical cancer screening: - Cytology - PAP for cervical cancer screening.  - Cytology - PAP([place])  9. Routine screening for STI (sexually transmitted infection): - Cervicovaginal self-swab to screen for chlamydia, gonorrhea, trichomonas, bacterial vaginitis, and candida vaginitis. - Cervicovaginal ancillary only  10.  Essential hypertension: - Blood pressure not at goal during today's visit. Patient asymptomatic without chest pressure, chest pain, palpitations, shortness of breath, worst headache of life, and any additional red flag symptoms. - Increase Hydrochlorothiazide from 25 mg daily to 50 mg daily.  - Counseled on blood pressure goal of less than 130/80, low-sodium, DASH diet, medication compliance, 150 minutes of moderate intensity exercise per week as tolerated. Discussed medication compliance, adverse effects. - Per patient preference referral to Cardiology for further evaluation and management. - Follow-up with primary provider as scheduled. - hydrochlorothiazide (HYDRODIURIL) 50 MG tablet; Take 0.5 tablets (25 mg total) by mouth daily.  Dispense: 30 tablet; Refill: 0 - Ambulatory referral to Cardiology  11. Osteoarthritis of both knees, unspecified osteoarthritis type: 12. Osteoarthritis of both ankles, unspecified osteoarthritis type: - Referral to Orthopedic Surgery for further evaluation and management.  - Ambulatory referral to Orthopedic Surgery    Patient was given the opportunity to ask questions.  Patient verbalized understanding of the plan and was able to repeat key elements of the plan. Patient was given clear instructions to go to Emergency Department or return to medical center if symptoms don't improve, worsen, or new problems develop.The patient verbalized understanding.   Requested Prescriptions   Signed Prescriptions Disp Refills   hydrochlorothiazide (HYDRODIURIL) 50 MG tablet 30 tablet 0    Sig: Take 0.5 tablets (25 mg total) by mouth daily.    Return in about 1 year (around 12/25/2021) for Physical per patient preference.  Camillia Herter, NP

## 2020-12-25 ENCOUNTER — Other Ambulatory Visit (HOSPITAL_COMMUNITY)
Admission: RE | Admit: 2020-12-25 | Discharge: 2020-12-25 | Disposition: A | Discharge status: Home / Self Care | Payer: 59 | Source: Ambulatory Visit | Attending: Family | Admitting: Family

## 2020-12-25 ENCOUNTER — Ambulatory Visit: Payer: 59 | Admitting: Family

## 2020-12-25 ENCOUNTER — Other Ambulatory Visit: Payer: Self-pay

## 2020-12-25 ENCOUNTER — Encounter: Payer: Self-pay | Admitting: Family

## 2020-12-25 VITALS — BP 137/95 | HR 69 | Resp 16 | Ht 69.0 in | Wt 229.0 lb

## 2020-12-25 DIAGNOSIS — Z0001 Encounter for general adult medical examination with abnormal findings: Secondary | ICD-10-CM | POA: Diagnosis not present

## 2020-12-25 DIAGNOSIS — I10 Essential (primary) hypertension: Secondary | ICD-10-CM | POA: Diagnosis not present

## 2020-12-25 DIAGNOSIS — Z0000 Encounter for general adult medical examination without abnormal findings: Secondary | ICD-10-CM

## 2020-12-25 DIAGNOSIS — Z130 Encounter for screening for diseases of the blood and blood-forming organs and certain disorders involving the immune mechanism: Secondary | ICD-10-CM

## 2020-12-25 DIAGNOSIS — Z124 Encounter for screening for malignant neoplasm of cervix: Secondary | ICD-10-CM | POA: Diagnosis not present

## 2020-12-25 DIAGNOSIS — Z113 Encounter for screening for infections with a predominantly sexual mode of transmission: Secondary | ICD-10-CM | POA: Insufficient documentation

## 2020-12-25 DIAGNOSIS — Z131 Encounter for screening for diabetes mellitus: Secondary | ICD-10-CM

## 2020-12-25 DIAGNOSIS — M19071 Primary osteoarthritis, right ankle and foot: Secondary | ICD-10-CM | POA: Diagnosis not present

## 2020-12-25 DIAGNOSIS — M19072 Primary osteoarthritis, left ankle and foot: Secondary | ICD-10-CM

## 2020-12-25 DIAGNOSIS — M170 Bilateral primary osteoarthritis of knee: Secondary | ICD-10-CM | POA: Diagnosis not present

## 2020-12-25 DIAGNOSIS — Z13228 Encounter for screening for other metabolic disorders: Secondary | ICD-10-CM

## 2020-12-25 DIAGNOSIS — Z13220 Encounter for screening for lipoid disorders: Secondary | ICD-10-CM

## 2020-12-25 DIAGNOSIS — Z23 Encounter for immunization: Secondary | ICD-10-CM

## 2020-12-25 DIAGNOSIS — Z1211 Encounter for screening for malignant neoplasm of colon: Secondary | ICD-10-CM

## 2020-12-25 DIAGNOSIS — Z1159 Encounter for screening for other viral diseases: Secondary | ICD-10-CM

## 2020-12-25 MED ORDER — HYDROCHLOROTHIAZIDE 50 MG PO TABS: 25.0000 mg | ORAL_TABLET | Freq: Every day | ORAL | 0 refills | Status: AC

## 2020-12-25 NOTE — Patient Instructions (Signed)
Preventive Care 40-57 Years Old, Female Preventive care refers to lifestyle choices and visits with your health care provider that can promote health and wellness. This includes: A yearly physical exam. This is also called an annual wellness visit. Regular dental and eye exams. Immunizations. Screening for certain conditions. Healthy lifestyle choices, such as: Eating a healthy diet. Getting regular exercise. Not using drugs or products that contain nicotine and tobacco. Limiting alcohol use. What can I expect for my preventive care visit? Physical exam Your health care provider will check your: Height and weight. These may be used to calculate your BMI (body mass index). BMI is a measurement that tells if you are at a healthy weight. Heart rate and blood pressure. Body temperature. Skin for abnormal spots. Counseling Your health care provider may ask you questions about your: Past medical problems. Family's medical history. Alcohol, tobacco, and drug use. Emotional well-being. Home life and relationship well-being. Sexual activity. Diet, exercise, and sleep habits. Work and work environment. Access to firearms. Method of birth control. Menstrual cycle. Pregnancy history. What immunizations do I need? Vaccines are usually given at various ages, according to a schedule. Your health care provider will recommend vaccines for you based on your age, medical history, and lifestyle or other factors, such as travel or where you work. What tests do I need? Blood tests Lipid and cholesterol levels. These may be checked every 5 years, or more often if you are over 50 years old. Hepatitis C test. Hepatitis B test. Screening Lung cancer screening. You may have this screening every year starting at age 55 if you have a 30-pack-year history of smoking and currently smoke or have quit within the past 15 years. Colorectal cancer screening. All adults should have this screening starting at  age 50 and continuing until age 75. Your health care provider may recommend screening at age 45 if you are at increased risk. You will have tests every 1-10 years, depending on your results and the type of screening test. Diabetes screening. This is done by checking your blood sugar (glucose) after you have not eaten for a while (fasting). You may have this done every 1-3 years. Mammogram. This may be done every 1-2 years. Talk with your health care provider about when you should start having regular mammograms. This may depend on whether you have a family history of breast cancer. BRCA-related cancer screening. This may be done if you have a family history of breast, ovarian, tubal, or peritoneal cancers. Pelvic exam and Pap test. This may be done every 3 years starting at age 21. Starting at age 30, this may be done every 5 years if you have a Pap test in combination with an HPV test. Other tests STD (sexually transmitted disease) testing, if you are at risk. Bone density scan. This is done to screen for osteoporosis. You may have this scan if you are at high risk for osteoporosis. Talk with your health care provider about your test results, treatment options, and if necessary, the need for more tests. Follow these instructions at home: Eating and drinking  Eat a diet that includes fresh fruits and vegetables, whole grains, lean protein, and low-fat dairy products. Take vitamin and mineral supplements as recommended by your health care provider. Do not drink alcohol if: Your health care provider tells you not to drink. You are pregnant, may be pregnant, or are planning to become pregnant. If you drink alcohol: Limit how much you have to 0-1 drink a day. Be   aware of how much alcohol is in your drink. In the [place] one drink equals one 12 oz bottle of beer (355 mL), one 5 oz glass of wine (148 mL), or one 1 oz glass of hard liquor (44 mL). Lifestyle Take daily care of your teeth and  gums. Brush your teeth every morning and night with fluoride toothpaste. Floss one time each day. Stay active. Exercise for at least 30 minutes 5 or more days each week. Do not use any products that contain nicotine or tobacco, such as cigarettes, e-cigarettes, and chewing tobacco. If you need help quitting, ask your health care provider. Do not use drugs. If you are sexually active, practice safe sex. Use a condom or other form of protection to prevent STIs (sexually transmitted infections). If you do not wish to become pregnant, use a form of birth control. If you plan to become pregnant, see your health care provider for a prepregnancy visit. If told by your health care provider, take low-dose aspirin daily starting at age 63. Find healthy ways to cope with stress, such as: Meditation, yoga, or listening to music. Journaling. Talking to a trusted person. Spending time with friends and family. Safety Always wear your seat belt while driving or riding in a vehicle. Do not drive: If you have been drinking alcohol. Do not ride with someone who has been drinking. When you are tired or distracted. While texting. Wear a helmet and other protective equipment during sports activities. If you have firearms in your house, make sure you follow all gun safety procedures. What's next? Visit your health care provider once a year for an annual wellness visit. Ask your health care provider how often you should have your eyes and teeth checked. Stay up to date on all vaccines. This information is not intended to replace advice given to you by your health care provider. Make sure you discuss any questions you have with your health care provider. Document Revised: 04/28/2020 Document Reviewed: 10/30/2017 Elsevier Patient Education  2022 [employer].

## 2020-12-25 NOTE — Progress Notes (Signed)
Need referrals and physical  Cardiology Orthopedist Mammogram

## 2020-12-26 ENCOUNTER — Other Ambulatory Visit: Payer: Self-pay | Admitting: Family

## 2020-12-26 DIAGNOSIS — E119 Type 2 diabetes mellitus without complications: Secondary | ICD-10-CM

## 2020-12-26 DIAGNOSIS — E785 Hyperlipidemia, unspecified: Secondary | ICD-10-CM | POA: Insufficient documentation

## 2020-12-26 LAB — CMP14+EGFR
ALT: 24 IU/L (ref 0–32)
AST: 22 IU/L (ref 0–40)
Albumin/Globulin Ratio: 1.7 (ref 1.2–2.2)
Albumin: 4.6 g/dL (ref 3.8–4.9)
Alkaline Phosphatase: 65 IU/L (ref 44–121)
BUN/Creatinine Ratio: 16 (ref 9–23)
BUN: 13 mg/dL (ref 6–24)
Bilirubin Total: 0.5 mg/dL (ref 0.0–1.2)
CO2: 25 mmol/L (ref 20–29)
Calcium: 9.9 mg/dL (ref 8.7–10.2)
Chloride: 100 mmol/L (ref 96–106)
Creatinine, Ser: 0.79 mg/dL (ref 0.57–1.00)
Globulin, Total: 2.7 g/dL (ref 1.5–4.5)
Glucose: 89 mg/dL (ref 70–99)
Potassium: 4.2 mmol/L (ref 3.5–5.2)
Sodium: 140 mmol/L (ref 134–144)
Total Protein: 7.3 g/dL (ref 6.0–8.5)
eGFR: 87 mL/min/{1.73_m2} (ref >59)

## 2020-12-26 LAB — CBC
Hematocrit: 41.2 % (ref 34.0–46.6)
Hemoglobin: 13.2 g/dL (ref 11.1–15.9)
MCH: 26.6 pg (ref 26.6–33.0)
MCHC: 32 g/dL (ref 31.5–35.7)
MCV: 83 fL (ref 79–97)
Platelets: 324 10*3/uL (ref 150–450)
RBC: 4.97 x10E6/uL (ref 3.77–5.28)
RDW: 12.9 % (ref 11.7–15.4)
WBC: 7.7 10*3/uL (ref 3.4–10.8)

## 2020-12-26 LAB — CERVICOVAGINAL ANCILLARY ONLY
Bacterial Vaginitis (gardnerella): NEGATIVE
Candida Glabrata: NEGATIVE
Candida Vaginitis: NEGATIVE
Chlamydia: NEGATIVE
Comment: NEGATIVE
Comment: NEGATIVE
Comment: NEGATIVE
Comment: NEGATIVE
Comment: NEGATIVE
Comment: NORMAL
Neisseria Gonorrhea: NEGATIVE
Trichomonas: NEGATIVE

## 2020-12-26 LAB — HEMOGLOBIN A1C
Est. average glucose Bld gHb Est-mCnc: 157 mg/dL
Hgb A1c MFr Bld: 7.1 % — ABNORMAL HIGH (ref 4.8–5.6)

## 2020-12-26 LAB — CYTOLOGY - PAP
Comment: NEGATIVE
Diagnosis: NEGATIVE
High risk HPV: NEGATIVE

## 2020-12-26 LAB — LIPID PANEL
Chol/HDL Ratio: 2.8 ratio (ref 0.0–4.4)
Cholesterol, Total: 243 mg/dL — ABNORMAL HIGH (ref 100–199)
HDL: 88 mg/dL (ref >39)
LDL Chol Calc (NIH): 145 mg/dL — ABNORMAL HIGH (ref 0–99)
Triglycerides: 59 mg/dL (ref 0–149)
VLDL Cholesterol Cal: 10 mg/dL (ref 5–40)

## 2020-12-26 LAB — HEPATITIS C ANTIBODY: Hep C Virus Ab: <0.1 s/co ratio (ref 0.0–0.9)

## 2020-12-26 MED ORDER — METFORMIN HCL 500 MG PO TABS: 500.0000 mg | ORAL_TABLET | Freq: Two times a day (BID) | ORAL | 0 refills | Status: DC

## 2020-12-26 MED ORDER — ATORVASTATIN CALCIUM 20 MG PO TABS: 20.0000 mg | ORAL_TABLET | Freq: Every day | ORAL | 0 refills | Status: DC

## 2020-12-26 NOTE — Progress Notes (Signed)
Please call patient with update.   Kidney function normal.   Liver function normal.   No anemia.   Hepatitis C negative.  Gonorrhea, Chlamydia, Trichomonas, Bacterial Vaginosis, and Candida Vaginitis (sometimes called yeast infection) negative.   PAP smear, no lesion or malignancy. HPV negative. Repeat PAP smear in 3 years or sooner if needed.   Hemoglobin A1c is consistent with diabetes. Practice healthy eating habits of fresh fruit and vegetables, lean baked meats such as chicken, fish, and [place]; limit breads, rice, pastas, and desserts; practice regular aerobic exercise (at least 150 minutes a week as tolerated).  Begin Metformin (Glucophage) as prescribed for diabetes. Encouraged to schedule appointment for diabetes follow-up in 4 weeks or sooner if needed. Please offer patient appointment while on the phone.   Cholesterol higher than expected. High cholesterol may increase risk of heart attack and/or stroke. Consider eating more fruits, vegetables, and lean baked meats such as chicken or fish. Moderate intensity exercise at least 150 minutes as tolerated per week may help as well. Begin Atorvastatin (Lipitor) for high cholesterol. Encouraged to recheck cholesterol at lab only appointment in 6 to 8 weeks.  The following is for provider reference only: The 10-year ASCVD risk score (Arnett DK, et al., 2019) is: 5.7%   Values used to calculate the score:     Age: 21 years     Sex: Female     Is Non-Hispanic African American: Yes     Diabetic: No     Tobacco smoker: No     Systolic Blood Pressure: 638 mmHg     Is BP treated: Yes     HDL Cholesterol: 88 mg/dL     Total Cholesterol: 243 mg/dL

## 2021-01-05 ENCOUNTER — Encounter (INDEPENDENT_AMBULATORY_CARE_PROVIDER_SITE_OTHER): Payer: Self-pay

## 2021-01-05 ENCOUNTER — Telehealth: Payer: Self-pay | Admitting: Family

## 2021-01-05 NOTE — Telephone Encounter (Signed)
Pt is asking for a call regarding results from 12/25/20. Pt states she was not able to access My Chart. Please advise and thank you.

## 2021-01-10 ENCOUNTER — Other Ambulatory Visit: Payer: Self-pay | Admitting: Family

## 2021-01-10 DIAGNOSIS — E119 Type 2 diabetes mellitus without complications: Secondary | ICD-10-CM

## 2021-01-30 ENCOUNTER — Encounter: Payer: Self-pay | Admitting: Orthopedic Surgery

## 2021-01-30 ENCOUNTER — Ambulatory Visit (INDEPENDENT_AMBULATORY_CARE_PROVIDER_SITE_OTHER): Payer: 59

## 2021-01-30 ENCOUNTER — Encounter: Payer: Self-pay | Admitting: Internal Medicine

## 2021-01-30 ENCOUNTER — Ambulatory Visit: Payer: Self-pay

## 2021-01-30 ENCOUNTER — Ambulatory Visit (INDEPENDENT_AMBULATORY_CARE_PROVIDER_SITE_OTHER): Payer: 59 | Admitting: Orthopedic Surgery

## 2021-01-30 ENCOUNTER — Other Ambulatory Visit: Payer: Self-pay

## 2021-01-30 DIAGNOSIS — M25562 Pain in left knee: Secondary | ICD-10-CM

## 2021-01-30 DIAGNOSIS — G8929 Other chronic pain: Secondary | ICD-10-CM

## 2021-01-30 DIAGNOSIS — M5442 Lumbago with sciatica, left side: Secondary | ICD-10-CM

## 2021-01-30 DIAGNOSIS — M25561 Pain in right knee: Secondary | ICD-10-CM

## 2021-01-30 DIAGNOSIS — M5441 Lumbago with sciatica, right side: Secondary | ICD-10-CM | POA: Diagnosis not present

## 2021-01-30 NOTE — Progress Notes (Signed)
Office Visit Note   Patient: Kimberly Anderson           Date of Birth: 1965-09-09           MRN: [id number] Visit Date: 01/30/2021              Requested by: Camillia Herter, NP [address] [place],  [place] [address] PCP: Camillia Herter, NP  Chief Complaint  Patient presents with   Left Knee - Pain   Right Knee - Pain   Lower Back - Pain      HPI: Patient is a 55 year old woman who presents with increasing pain in both knees.  Patient complains of swelling and medial and lateral joint line pain.  Patient states that she has used anti-inflammatories in the past but is not on anti-inflammatories.  She states that she is not on metformin for her diabetes.  Patient complains of back pain radiating down to the anterior aspects of both thighs.  She complains of start up pain in the thighs.  She denies any groin pain states that this is associated with lower back pain.  Assessment & Plan: Visit Diagnoses:  1. Chronic pain of both knees   2. Chronic bilateral low back pain with bilateral sciatica     Plan: Both knees were injected she tolerated this well.  Plan to follow-up in 4 to 6 weeks for evaluation for possible hyaluronic acid injection for both knees.  Follow-Up Instructions: Return in about 2 months (around 04/01/2021).   Ortho Exam  Patient is alert, oriented, no adenopathy, well-dressed, normal affect, normal respiratory effort. Examination patient has a negative straight leg raise bilaterally with no focal motor weakness.  There is no pain with range of motion of her hips she does have pain with range of motion of her knees and is tender to palpation in the patellofemoral joint as well as medial lateral joint line.  Collaterals and cruciates are stable there is crepitation with range of motion.   Hemoglobin A1c is 7.1 and she has stopped taking her diabetic medication.  Imaging: No results found. No images are attached to the encounter.  Labs: Lab  Results  Component Value Date   HGBA1C 7.1 (H) 12/25/2020   REPTSTATUS 09/28/2014 FINAL 09/26/2014   GRAMSTAIN  08/24/2009    ABUNDANT WBC PRESENT,BOTH PMN AND MONONUCLEAR RARE SQUAMOUS EPITHELIAL CELLS PRESENT FEW GRAM NEGATIVE RODS RARE GRAM POSITIVE COCCI IN PAIRS   CULT  09/26/2014    MULTIPLE SPECIES PRESENT, SUGGEST RECOLLECTION Performed at [facility]      Lab Results  Component Value Date   ALBUMIN 4.6 12/25/2020   ALBUMIN 3.7 06/16/2016   ALBUMIN 4.1 05/01/2015    No results found for: MG No results found for: VD25OH  No results found for: PREALBUMIN CBC EXTENDED Latest Ref Rng & Units 12/25/2020 04/01/2020 06/16/2016  WBC 3.4 - 10.8 x10E3/uL 7.7 9.2 6.9  RBC 3.77 - 5.28 x10E6/uL 4.97 4.51 4.27  HGB 11.1 - 15.9 g/dL 13.2 12.1 11.5(L)  HCT 34.0 - 46.6 % 41.2 37.9 35.2(L)  PLT 150 - 450 x10E3/uL 324 291 238  NEUTROABS 1.7 - 7.7 K/uL - - -  LYMPHSABS 0.7 - 4.0 K/uL - - -     There is no height or weight on file to calculate BMI.  Orders:  Orders Placed This Encounter  Procedures   XR Knee 1-2 Views Right   XR Knee 1-2 Views Left   XR Lumbar Spine  2-3 Views   No orders of the defined types were placed in this encounter.    Procedures: Large Joint Inj: bilateral knee on 01/30/2021 5:17 PM Indications: pain and diagnostic evaluation Details: 22 G 1.5 in needle, anteromedial approach  Arthrogram: No  Outcome: tolerated well, no immediate complications Procedure, treatment alternatives, risks and benefits explained, specific risks discussed. Consent was given by the patient. Immediately prior to procedure a time out was called to verify the correct patient, procedure, equipment, support staff and site/side marked as required. Patient was prepped and draped in the usual sterile fashion.     Clinical Data: No additional findings.  ROS:  All other systems negative, except as noted in the HPI. Review of Systems  Objective: Vital Signs: LMP  03/05/2011   Specialty Comments:  No specialty comments available.  PMFS History: Patient Active Problem List   Diagnosis Date Noted   Diabetes mellitus, type 2 ([place]) 12/26/2020   Hyperlipidemia 12/26/2020   Hyperthyroidism 06/13/2020   Osteoarthritis 03/17/2019   Bilateral pes planus 10/29/2018   Metatarsalgia of both feet 10/29/2018   Onychomycosis due to dermatophyte 10/29/2018   Acute cholecystitis 05/02/2015   Abdominal pain 02/28/2012   Anemia 01/09/2012   Fatigue 01/09/2012   Hot flashes 01/09/2012   Morbid obesity ([place]) 01/09/2012   Neck pain 01/09/2012   Hot flashes due to surgical menopause 11/06/2011   Pain in right knee 11/06/2011   Iron deficiency anemia 11/06/2011   Cervical strain 11/27/2010   Preventative health care 11/06/2010   Past Medical History:  Diagnosis Date   Drug abuse ([place])    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   Fibroids    Gall stones    Hypertension    Hyperthyroidism    Obesity    Thyroid disease     Family History  Problem Relation Age of Onset   Diabetes type II Mother        Not currently on insulin but had been at one time   Hypertension Mother    Hyperlipidemia Mother    Coronary artery disease Mother        s/p stents   Hypertension Father    Hyperlipidemia Father    Prostate cancer Father 23       s/p radiation   Stroke Father 13   Fibroids Sister        s/p fibroidectomy 2/2 dub   Breast cancer Maternal Aunt    Thyroid disease Daughter     Past Surgical History:  Procedure Laterality Date   ABDOMINAL HYSTERECTOMY  03/11/2011   Procedure: HYSTERECTOMY ABDOMINAL;  Surgeon: Juliene Pina C. Hulan Fray, MD;  Location: [facility];  Service: Gynecology;  Laterality: N/A;  Possible removal of tubes & ovaries   Bilateral tubal ligation  1996   CHOLECYSTECTOMY N/A 05/02/2015   Procedure: LAPAROSCOPIC CHOLECYSTECTOMY;  Surgeon: Ralene Ok, MD;  Location: [facility];  Service: General;  Laterality: N/A;   SALPINGOOPHORECTOMY  03/11/2011    Procedure: SALPINGO OOPHERECTOMY;  Surgeon: Wilhemina Cash. Hulan Fray, MD;  Location: [facility];  Service: Gynecology;  Laterality: Bilateral;   SVD     x 3   TUBAL LIGATION N/A    Phreesia 04/30/2020   Social History   Occupational History   Not on file  Tobacco Use   Smoking status: Former    Packs/day: 0.30    Years: 35.00    Pack years: 10.50    Types: Cigarettes    Quit date: 06/06/2010    Years since quitting: 61.6  Smokeless tobacco: Never  Vaping Use   Vaping Use: Never used  Substance and Sexual Activity   Alcohol use: No   Drug use: No   Sexual activity: Not Currently    Birth control/protection: Surgical

## 2021-03-16 ENCOUNTER — Ambulatory Visit (AMBULATORY_SURGERY_CENTER): Payer: Self-pay

## 2021-03-16 ENCOUNTER — Other Ambulatory Visit: Payer: Self-pay

## 2021-03-16 VITALS — Ht 69.0 in | Wt 228.0 lb

## 2021-03-16 DIAGNOSIS — Z1211 Encounter for screening for malignant neoplasm of colon: Secondary | ICD-10-CM

## 2021-03-16 MED ORDER — PEG 3350-KCL-NA BICARB-NACL 420 G PO SOLR: 4000.0000 mL | Freq: Once | ORAL | 0 refills | Status: AC

## 2021-03-16 NOTE — Progress Notes (Signed)
Denies allergies to eggs or soy products. Denies complication of anesthesia or sedation. Denies use of weight loss medication. Denies use of O2.   Emmi instructions given for colonoscopy.   Patient was instructed to discontinue Phentermine 10 days prior to her procedure. Last dose will be 03/19/21. Patient verbalizes understanding.

## 2021-03-20 ENCOUNTER — Other Ambulatory Visit (HOSPITAL_COMMUNITY): Payer: Self-pay

## 2021-03-20 MED ORDER — MOUNJARO 2.5 MG/0.5ML ~~LOC~~ SOAJ
2.5000 mg | SUBCUTANEOUS | 1 refills | Status: DC
  Filled 2021-03-20: qty 2, 28d supply, fill #0

## 2021-03-27 ENCOUNTER — Encounter: Payer: Self-pay | Admitting: Internal Medicine

## 2021-03-28 ENCOUNTER — Ambulatory Visit: Payer: 59 | Admitting: Podiatry

## 2021-03-29 ENCOUNTER — Telehealth: Payer: Self-pay

## 2021-03-29 NOTE — Telephone Encounter (Signed)
Pt called on 03/29/21 in afternoon stating that she may need to cancel her colonoscopy that is scheduled for tomorrow (03/30/21) @ 4:30 pm since she has not followed the 5-day diet in the prep instructions.  Pt stated that she has been eating salads all week and ate pancakes and sausage for breakfast this morning and a [employer] hamburger and fries today for lunch.  Pt stated she had not been on a liquid diet today and had not yet started the Dulcolax tablets that she was to take at 3:00 pm.  I consulted with Charge RN, Judson Roch Monday who agreed that pt should be cancelled.  Informed pt that procedure would be cancelled.  Discussed with pt why procedure would be cancelled.  Pt aware that a message will be forwarded to Dr. Lorenso Courier and that her nurse will call pt to reschedule procedure and new instructions will be sent to her.

## 2021-03-30 ENCOUNTER — Encounter: Payer: 59 | Admitting: Internal Medicine

## 2021-04-02 NOTE — Telephone Encounter (Signed)
Called pt to reschedule direct colon. Has not been established with a provider of our practice. Also appears pt was not scheduled for a PV in the past. Given pt failure to follow prep instructions, PV scheduled 2/24. Per pt request, rescheduled for soonest Friday availability with Dr. Candis Schatz @ [facility] on 05/04/21.

## 2021-05-04 ENCOUNTER — Encounter: Payer: 59 | Admitting: Gastroenterology

## 2021-05-12 ENCOUNTER — Other Ambulatory Visit: Payer: Self-pay | Admitting: Family

## 2021-05-12 DIAGNOSIS — E785 Hyperlipidemia, unspecified: Secondary | ICD-10-CM

## 2021-05-25 ENCOUNTER — Ambulatory Visit
Admission: EM | Admit: 2021-05-25 | Discharge: 2021-05-25 | Disposition: A | Discharge status: Home / Self Care | Payer: 59

## 2021-05-25 DIAGNOSIS — M5136 Other intervertebral disc degeneration, lumbar region: Secondary | ICD-10-CM

## 2021-05-25 DIAGNOSIS — E119 Type 2 diabetes mellitus without complications: Secondary | ICD-10-CM

## 2021-05-25 DIAGNOSIS — J302 Other seasonal allergic rhinitis: Secondary | ICD-10-CM

## 2021-05-25 DIAGNOSIS — J310 Chronic rhinitis: Secondary | ICD-10-CM

## 2021-05-25 DIAGNOSIS — J329 Chronic sinusitis, unspecified: Secondary | ICD-10-CM

## 2021-05-25 DIAGNOSIS — J3089 Other allergic rhinitis: Secondary | ICD-10-CM

## 2021-05-25 DIAGNOSIS — J0100 Acute maxillary sinusitis, unspecified: Secondary | ICD-10-CM

## 2021-05-25 DIAGNOSIS — M170 Bilateral primary osteoarthritis of knee: Secondary | ICD-10-CM

## 2021-05-25 DIAGNOSIS — M62830 Muscle spasm of back: Secondary | ICD-10-CM

## 2021-05-25 DIAGNOSIS — M5431 Sciatica, right side: Secondary | ICD-10-CM

## 2021-05-25 MED ORDER — TRULICITY 0.75 MG/0.5ML ~~LOC~~ SOAJ: 0.7500 mg | SUBCUTANEOUS | 2 refills | Status: AC

## 2021-05-25 MED ORDER — METHYLPREDNISOLONE SODIUM SUCC 125 MG IJ SOLR
125.0000 mg | Freq: Once | INTRAMUSCULAR | Status: AC
  Administered 2021-05-25: 125 mg via INTRAMUSCULAR

## 2021-05-25 MED ORDER — LINAGLIPTIN 5 MG PO TABS: 5.0000 mg | ORAL_TABLET | Freq: Every day | ORAL | 2 refills | Status: DC

## 2021-05-25 MED ORDER — FLUTICASONE PROPIONATE 50 MCG/ACT NA SUSP: 2.0000 | Freq: Every day | NASAL | 2 refills | Status: DC

## 2021-05-25 MED ORDER — ACETAMINOPHEN 500 MG PO TABS: 1000.0000 mg | ORAL_TABLET | Freq: Three times a day (TID) | ORAL | 2 refills | Status: AC | PRN

## 2021-05-25 MED ORDER — METHYLPREDNISOLONE 4 MG PO TBPK: ORAL_TABLET | ORAL | 0 refills | Status: DC

## 2021-05-25 MED ORDER — CETIRIZINE HCL 10 MG PO TABS: 10.0000 mg | ORAL_TABLET | Freq: Every day | ORAL | 2 refills | Status: AC

## 2021-05-25 MED ORDER — IBUPROFEN 400 MG PO TABS
400.0000 mg | ORAL_TABLET | Freq: Three times a day (TID) | ORAL | 2 refills | Status: DC | PRN
Start: 2021-05-25 — End: 2021-07-04

## 2021-05-25 MED ORDER — BACLOFEN 10 MG PO TABS: 10.0000 mg | ORAL_TABLET | Freq: Every day | ORAL | 0 refills | Status: AC

## 2021-05-25 NOTE — ED Triage Notes (Signed)
Pt c/o nasal congestion, sore throat and generalized aching that began a week ago.  ?

## 2021-05-25 NOTE — ED Provider Notes (Signed)
?[facility] ? ? ? ?CSN: [id number] ?Arrival date & time: 05/25/21  1024 ?  ? ?HISTORY  ? ?Chief Complaint  ?Patient presents with  ? Generalized Body Aches  ? Nasal Congestion  ? Sore Throat  ? ?HPI ?Kimberly Anderson is a 56 y.o. female. Pt c/o nasal congestion, sore throat and generalized aching that began a week ago.  Patient reports a history of mild seasonal allergies but not currently taking allergy medication at this time.  Patient denies cough, fever, aches, chills, nausea, vomiting, diarrhea.  Patient endorses mild sinus pressure above her eyes. ? ?Patient complains of pain in her right anterior upper thigh.  Patient endorses a history of arthritis in her lower back and both of her knees.  Patient states has been meloxicam in the past which has not been helpful so she stopped taking it.  Patient states she is not taking any medications for pain at this time.  Patient states the pain is keeping her awake at night and is not worse with walking. ? ?Patient states she was previously prescribed Mounjaro and metformin for her type 2 diabetes by her primary care provider.  Patient states her blood sugar was at 140 this morning.  Patient states she does not tolerate metformin because it gives her terrible diarrhea.  Patient states the Medrol was too expensive and so she is not taking it.  Patient states that she took one of her mother's Tradjenta since this morning which got her sugar down nicely, patient is asking for prescription for the medication.  EMR reviewed, last A1c was 7.1. ? ?The history is provided by the patient.  ?Past Medical History:  ?Diagnosis Date  ? Anemia   ? Arthritis   ? Diabetes mellitus without complication ([place])   ? Drug abuse ([place])   ? 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 Fibroids   ? Gall stones   ? Hypertension   ? Hyperthyroidism   ? Obesity   ? Thyroid disease   ? ?Patient Active Problem List  ? Diagnosis Date Noted  ? Diabetes mellitus, type 2 ([place]) 12/26/2020  ?  Hyperlipidemia 12/26/2020  ? Hyperthyroidism 06/13/2020  ? Osteoarthritis 03/17/2019  ? Bilateral pes planus 10/29/2018  ? Metatarsalgia of both feet 10/29/2018  ? Onychomycosis due to dermatophyte 10/29/2018  ? Acute cholecystitis 05/02/2015  ? Abdominal pain 02/28/2012  ? Anemia 01/09/2012  ? Fatigue 01/09/2012  ? Hot flashes 01/09/2012  ? Morbid obesity ([place]) 01/09/2012  ? Neck pain 01/09/2012  ? Hot flashes due to surgical menopause 11/06/2011  ? Pain in right knee 11/06/2011  ? Iron deficiency anemia 11/06/2011  ? Cervical strain 11/27/2010  ? Preventative health care 11/06/2010  ? ?Past Surgical History:  ?Procedure Laterality Date  ? ABDOMINAL HYSTERECTOMY  03/11/2011  ? Procedure: HYSTERECTOMY ABDOMINAL;  Surgeon: Juliene Pina C. Hulan Fray, MD;  Location: [facility];  Service: Gynecology;  Laterality: N/A;  Possible removal of tubes & ovaries  ? Bilateral tubal ligation  1996  ? CHOLECYSTECTOMY N/A 05/02/2015  ? Procedure: LAPAROSCOPIC CHOLECYSTECTOMY;  Surgeon: Ralene Ok, MD;  Location: [facility];  Service: General;  Laterality: N/A;  ? SALPINGOOPHORECTOMY  03/11/2011  ? Procedure: SALPINGO OOPHERECTOMY;  Surgeon: Myra C. Hulan Fray, MD;  Location: [facility];  Service: Gynecology;  Laterality: Bilateral;  ? SVD    ? x 3  ? TUBAL LIGATION N/A   ? Phreesia 04/30/2020  ? ?OB History   ? ? Gravida  ?4  ? Para  ?3  ?  Term  ?3  ? Preterm  ?0  ? AB  ?1  ? Living  ?   ?  ? ? SAB  ?1  ? IAB  ?   ? Ectopic  ?   ? Multiple  ?   ? Live Births  ?   ?   ?  ?  ? ?Home Medications   ? ?Prior to Admission medications   ?Medication Sig Start Date End Date Taking? Authorizing Provider  ?atorvastatin (LIPITOR) 20 MG tablet TAKE 1 TABLET BY MOUTH EVERY DAY 05/14/21   Camillia Herter, NP  ?ergocalciferol (VITAMIN D2) 1.25 MG (50000 UT) capsule ergocalciferol (vitamin D2) 1,250 mcg (50,000 unit) capsule    [provider]  ?hydrochlorothiazide (HYDRODIURIL) 50 MG tablet Take 0.5 tablets (25 mg total) by mouth daily. 12/25/20 02/23/21  Camillia Herter, NP  ?lidocaine (XYLOCAINE) 2 % solution Use as directed 15 mLs in the mouth or throat as needed for mouth pain. 05/28/20   Hazel Sams, PA-C  ?Multiple Vitamin (MULTIVITAMIN) capsule Take 1 capsule by mouth daily.    [provider]  ?phentermine 37.5 MG capsule Take 37.5 mg by mouth every morning.    [provider]  ?tirzepatide Darcel Bayley) 2.5 MG/0.5ML Pen Inject 2.5 mg into the skin once a week. 02/07/21   Nolene Ebbs, MD  ? ?Family History ?Family History  ?Problem Relation Age of Onset  ? Diabetes type II Mother   ?     Not currently on insulin but had been at one time  ? Hypertension Mother   ? Hyperlipidemia Mother   ? Coronary artery disease Mother   ?     s/p stents  ? Hypertension Father   ? Hyperlipidemia Father   ? Prostate cancer Father 21  ?     s/p radiation  ? Stroke Father 63  ? Fibroids Sister   ?     s/p fibroidectomy 2/2 dub  ? Breast cancer Maternal Aunt   ? Thyroid disease Daughter   ? Colon cancer Neg Hx   ? Esophageal cancer Neg Hx   ? Rectal cancer Neg Hx   ? Stomach cancer Neg Hx   ? ?Social History ?Social History  ? ?Tobacco Use  ? Smoking status: Former  ?  Packs/day: 0.30  ?  Years: 35.00  ?  Pack years: 10.50  ?  Types: Cigarettes  ?  Quit date: 06/06/2010  ?  Years since quitting: 10.9  ? Smokeless tobacco: Never  ?Vaping Use  ? Vaping Use: Never used  ?Substance Use Topics  ? Alcohol use: No  ? Drug use: No  ? ?Allergies   ?Patient has no known allergies. ? ?Review of Systems ?Review of Systems ?Pertinent findings noted in history of present illness.  ? ?Physical Exam ?Triage Vital Signs ?ED Triage Vitals  ?Enc Vitals Group  ?   BP 12/29/20 0827 (!) 147/82  ?   Pulse Rate 12/29/20 0827 72  ?   Resp 12/29/20 0827 18  ?   Temp 12/29/20 0827 98.3 ?F (36.8 ?C)  ?   Temp Source 12/29/20 0827 Oral  ?   SpO2 12/29/20 0827 98 %  ?   Weight --   ?   Height --   ?   Head Circumference --   ?   Peak Flow --   ?   Pain Score 12/29/20 0826 5  ?   Pain Loc --   ?   Pain  Edu? --   ?   Excl. in [place]? --   ?No data found. ? ?Updated Vital Signs ?BP 116/81 (BP Location: Left Arm)   Pulse 91   Temp 98.1 ?F (36.7 ?C) (Oral)   Resp 20   LMP 03/05/2011   SpO2 95%  ? ?Physical Exam ?Vitals and nursing note reviewed.  ?Constitutional:   ?   General: She is not in acute distress. ?   Appearance: Normal appearance. She is not ill-appearing.  ?HENT:  ?   Head: Normocephalic and atraumatic.  ?   Salivary Glands: Right salivary gland is not diffusely enlarged or tender. Left salivary gland is not diffusely enlarged or tender.  ?   Right Ear: Ear canal and external ear normal. No drainage. A middle ear effusion is present. There is no impacted cerumen. Tympanic membrane is bulging. Tympanic membrane is not injected or erythematous.  ?   Left Ear: Ear canal and external ear normal. No drainage. A middle ear effusion is present. There is no impacted cerumen. Tympanic membrane is bulging. Tympanic membrane is not injected or erythematous.  ?   Ears:  ?   Comments: Bilateral EACs normal, both TMs bulging with clear fluid ?   Nose: Rhinorrhea present. No nasal deformity, septal deviation, signs of injury, nasal tenderness, mucosal edema or congestion. Rhinorrhea is clear.  ?   Right Nostril: Occlusion present. No foreign body, epistaxis or septal hematoma.  ?   Left Nostril: Occlusion present. No foreign body, epistaxis or septal hematoma.  ?   Right Turbinates: Enlarged, swollen and pale.  ?   Left Turbinates: Enlarged, swollen and pale.  ?   Right Sinus: No maxillary sinus tenderness or frontal sinus tenderness.  ?   Left Sinus: No maxillary sinus tenderness or frontal sinus tenderness.  ?   Mouth/Throat:  ?   Lips: Pink. No lesions.  ?   Mouth: Mucous membranes are moist. No oral lesions.  ?   Pharynx: Oropharynx is clear. Uvula midline. No posterior oropharyngeal erythema or uvula swelling.  ?   Tonsils: No tonsillar exudate. 0 on the right. 0 on the left.  ?   Comments: Postnasal drip ?Eyes:  ?    General: Lids are normal.     ?   Right eye: No discharge.     ?   Left eye: No discharge.  ?   Extraocular Movements: Extraocular movements intact.  ?   Conjunctiva/sclera: Conjunctivae normal.  ?   Right

## 2021-05-25 NOTE — Discharge Instructions (Addendum)
Your symptoms and my physical exam findings are concerning for exacerbation of your underlying allergies.  It is important that you are consistent with taking allergy medications exactly as prescribed.   ?  ?Please see the list below for recommended medications, dosages and frequencies to provide relief of your current symptoms:   ?  ? ?For your allergy symptoms: ? ?Methylprednisolone IM (Solu-Medrol):  To quickly address your significant respiratory inflammation, you were provided with an injection of methylprednisolone in the office today.  You should continue to feel the full benefit of the steroid for the next 4 to 6 hours.  ?  ?Zyrtec (cetirizine): This is an excellent second-generation antihistamine that helps to reduce respiratory inflammatory response to environmental allergens.  In some patients, this medication can cause daytime sleepiness so I recommend that you take 1 tablet daily at bedtime.   ?  ?Flonase (fluticasone): This is a steroid nasal spray that you use once daily, 1 spray in each nare.  This medication does not work well if you decide to use it only used as you feel you need to, it works best used on a daily basis.  After 3 to 5 days of use, you will notice significant reduction of the inflammation and mucus production that is currently being caused by exposure to allergens, whether seasonal or environmental.  The most common side effect of this medication is nosebleeds.  If you experience a nosebleed, please discontinue use for 1 week, then feel free to resume.  I have provided you with a prescription but you can also purchase this medication over-the-counter if your insurance will not cover it. ?  ?Ipratropium (Atrovent): This is an excellent nasal decongestant spray that does not cause rebound congestion, please instill 2 sprays into each nare with each use.  Because nasal steroids can take several days before they begin to provide full benefit, I recommend that you use this spray in  addition to the nasal steroid prescribed for you.  Please use it after you have used your nasal steroid and repeat up to 4 times daily as needed.  I have provided your daughter with a prescription for this medication, she should receive 3 bottles and hopefully she will share one with you.    ?  ?If you find that you have not had significant relief of your allergy and sinus symptoms in the next 7 to 10 days, please follow-up with your primary care provider or with [place] here at urgent care. ? ? ?For your lower back, hip and bilateral knee pain: ? ?During your visit today, you received an injection of methylprednisolone, high-dose steroidal anti-inflammatory medication that should significantly reduce your pain for the next 6 to 8 hours. ? ?Please begin taking Tylenol 1000 mg 3 times daily this afternoon and continue this every 8 hours for best pain prevention. ?  ?This evening, please begin taking baclofen 10 mg.  This is a highly effective muscle relaxer and antispasmodic which should continue to provide you with relaxation of your tense muscles, allow you to sleep well and to keep your pain under control. ?  ?Tomorrow morning, please begin taking methylprednisolone.  Please take 1 full row tablets at once with your breakfast meal.  If you have had significant resolution of your pain before you finish the entire prescription, please feel free to discontinue.   ? ?Methylprednisolone (Medrol Dosepak): This is a steroid that will significantly calm your upper and lower airways, please take one row of tablets daily with  your breakfast meal starting tomorrow morning until the prescription is complete.    ? ?Once you have finished methylprednisolone, please begin taking ibuprofen 400 mg 3 times daily along with your dose of Tylenol 1000 mg.  This commendation together is as effective as narcotic pain medication when taken every 8 hours on a regular basis. ? ?Ibuprofen  (Advil, Motrin): This is a good anti-inflammatory  medication which addresses aches, pains and inflammation of the upper airways that causes sinus and nasal congestion as well as in the lower airways which makes your cough feel tight and sometimes burn.  I recommend that you take between 400 milligrams every 8 hours with your dose of Tylenol. ? ?Acetaminophen (Tylenol): This is a good analgesic, pain reliever.  When taken at 8 hour intervals which will maintain a steady state of Tylenol your system, it is highly effective at relieving pain. ?  ?During the day, please make appoint to apply ice to the affected areas that are painful 4 times daily for 20 minutes each application.  This can be achieved by using a bag of frozen peas or corn, a Ziploc bag filled with ice and water, or Ziploc bag filled with half rubbing alcohol and half Dawn dish detergent, frozen into a slush.  Please be careful not to apply ice directly to your skin, always place a soft cloth between you and the ice pack. ?  ?You are welcome to use topical anti-inflammatory creams such as Voltaren gel, capsaicin or Aspercreme as recommended. ?  ?Please avoid attempts to stretch or strengthen the affected area until you are feeling completely pain-free.  Attempts to do so will only prolong the healing process. ?  ?I also recommend that you remain out of work for the next several days, I provided you with a note to return to work in 3 days.  If you feel that you need this time extended, please follow-up with your primary care provider or return to urgent care for reevaluation so that we can provide you with a note for another 3 days. ? ? ?For your type 2 diabetes: ? ?I provided you with a prescription for Trulicity along with a manufactures coupon that should significantly reduce your co-pay if your insurance will pay for the prescription.  Unfortunately, our system is not compatible with your insurance company and not able to tell whether or not Trulicity will be covered by your insurance so the pharmacy  will be able to let you know how to proceed. ? ?I also provided you with a prescription for Tradjenta that you can take once daily. ? ?Please follow-up with your primary care provider to let them know that we made these changes to your diabetes regimen and to find out when they would like to see you for follow-up. ? ?  ?Thank you for visiting urgent care today.  We appreciate the opportunity to participate in your care. ? ?

## 2021-05-28 ENCOUNTER — Telehealth: Payer: Self-pay

## 2021-05-28 NOTE — Telephone Encounter (Signed)
Pt made aware that she will need prior authorization from PCP for Trulicity.  ?

## 2021-06-06 ENCOUNTER — Other Ambulatory Visit: Payer: Self-pay | Admitting: Internal Medicine

## 2021-06-07 ENCOUNTER — Telehealth: Payer: Self-pay | Admitting: Family

## 2021-06-07 NOTE — Telephone Encounter (Signed)
Copied from [place] [phone]. Topic: Referral - Request for Referral ?>> Jun 06, 2021  4:08 PM Tessa Lerner A wrote: ?Has patient seen PCP for this complaint? Yes.   ?*If NO, is insurance requiring patient see PCP for this issue before PCP can refer them? ?Referral for which specialty: Imaging  ?Preferred provider/office: Patient has no preference but needs the facility to accept their insurance Friday  ?Reason for referral: Patient needs a mammogram ?

## 2021-06-11 ENCOUNTER — Other Ambulatory Visit: Payer: Self-pay | Admitting: Family

## 2021-06-11 DIAGNOSIS — Z1231 Encounter for screening mammogram for malignant neoplasm of breast: Secondary | ICD-10-CM

## 2021-06-11 NOTE — Telephone Encounter (Signed)
Updated referral complete.

## 2021-06-18 ENCOUNTER — Telehealth: Payer: Self-pay | Admitting: Family

## 2021-06-18 NOTE — Telephone Encounter (Signed)
Copied from [place] [phone]. Topic: General - Other ?>> Jun 18, 2021 10:45 AM Kimberly Anderson wrote: ?Reason for CRM: pt called in to update provider. Pt says that she started metformin. Pt says that medication is causing her to have diarrhea. Pt says that she is unable to work with this side effect.  ? ? ?Please assist pt further. ?

## 2021-06-18 NOTE — Telephone Encounter (Signed)
Att to contact pt to schedule appt no ans lvm-  ?

## 2021-06-21 ENCOUNTER — Encounter: Payer: Self-pay | Admitting: Family

## 2021-06-21 ENCOUNTER — Ambulatory Visit (INDEPENDENT_AMBULATORY_CARE_PROVIDER_SITE_OTHER): Payer: 59 | Admitting: Family

## 2021-06-21 DIAGNOSIS — M1712 Unilateral primary osteoarthritis, left knee: Secondary | ICD-10-CM

## 2021-06-21 DIAGNOSIS — M1711 Unilateral primary osteoarthritis, right knee: Secondary | ICD-10-CM | POA: Diagnosis not present

## 2021-06-21 DIAGNOSIS — M170 Bilateral primary osteoarthritis of knee: Secondary | ICD-10-CM | POA: Diagnosis not present

## 2021-06-21 MED ORDER — LIDOCAINE HCL 1 % IJ SOLN
5.0000 mL | INTRAMUSCULAR | Status: AC | PRN
  Administered 2021-06-21: 5 mL

## 2021-06-21 MED ORDER — METHYLPREDNISOLONE ACETATE 40 MG/ML IJ SUSP
40.0000 mg | INTRAMUSCULAR | Status: AC | PRN
  Administered 2021-06-21: 40 mg via INTRA_ARTICULAR

## 2021-06-21 NOTE — Progress Notes (Signed)
? ?Office Visit Note ?  ?Patient: Kimberly Anderson           ?Date of Birth: 1965-11-27           ?MRN: [id number] ?Visit Date: 06/21/2021 ?             ?Requested by: Camillia Herter, NP ?[facility] ?[address] ?[place],  [place] [address] ?PCP: Camillia Herter, NP ? ?No chief complaint on file. ? ? ? ? ?HPI: ?The patient is a 56 year old woman who comes in today complaining of bilateral knee pain has a history of osteoarthritis she states that pain has been gradually worsening especially the right knee she has been having significant medial joint line pain she has not had any falls no mechanical symptoms very minimal relief with ibuprofen. ? ?Was disappointed in the relief she got from her last cortisone injections.  She is interested in considering supplemental injections ? ?Assessment & Plan: ?Visit Diagnoses: No diagnosis found. ? ?Plan: Cortisone injection bilateral knee patient tolerated well will go ahead and get prior authorization for supplemental injections for bilateral knees today.  She will follow-up in the office once these are approved ? ?Follow-Up Instructions: No follow-ups on file.  ? ?Right Knee Exam  ? ?Muscle Strength  ?The patient has normal right knee strength. ? ?Tenderness  ?The patient is experiencing tenderness in the medial joint line. ? ?Range of Motion  ?The patient has normal right knee ROM. ? ?Other  ?Swelling: mild ?Effusion: effusion present ? ? ?Left Knee Exam  ? ?Muscle Strength  ?The patient has normal left knee strength. ? ?Tenderness  ?The patient is experiencing tenderness in the medial joint line. ? ?Range of Motion  ?The patient has normal left knee ROM. ? ?Other  ?Swelling: mild ?Effusion: effusion present ? ? ? ? ?Patient is alert, oriented, no adenopathy, well-dressed, normal affect, normal respiratory effort. ? ? ?Imaging: ?No results found. ?No images are attached to the encounter. ? ?Labs: ?Lab Results  ?Component Value Date  ? HGBA1C 7.1 (H) 12/25/2020  ?  REPTSTATUS 09/28/2014 FINAL 09/26/2014  ? GRAMSTAIN  08/24/2009  ?  ABUNDANT WBC PRESENT,BOTH PMN AND MONONUCLEAR ?RARE SQUAMOUS EPITHELIAL CELLS PRESENT ?FEW GRAM NEGATIVE RODS ?RARE GRAM POSITIVE COCCI IN PAIRS  ? CULT  09/26/2014  ?  MULTIPLE SPECIES PRESENT, SUGGEST RECOLLECTION ?Performed at [facility] ?  ? ? ? ?Lab Results  ?Component Value Date  ? ALBUMIN 4.6 12/25/2020  ? ALBUMIN 3.7 06/16/2016  ? ALBUMIN 4.1 05/01/2015  ? ? ?No results found for: MG ?No results found for: VD25OH ? ?No results found for: PREALBUMIN ? ?  Latest Ref Rng & Units 12/25/2020  ? 11:16 AM 04/01/2020  ?  4:56 PM 06/16/2016  ?  2:10 PM  ?CBC EXTENDED  ?WBC 3.4 - 10.8 x10E3/uL 7.7   9.2   6.9    ?RBC 3.77 - 5.28 x10E6/uL 4.97   4.51   4.27    ?Hemoglobin 11.1 - 15.9 g/dL 13.2   12.1   11.5    ?HCT 34.0 - 46.6 % 41.2   37.9   35.2    ?Platelets 150 - 450 x10E3/uL 324   291   238    ? ? ? ?There is no height or weight on file to calculate BMI. ? ?Orders:  ?No orders of the defined types were placed in this encounter. ? ?No orders of the defined types were placed in this encounter. ? ? ? Procedures: ?Large  Joint Inj: bilateral knee on 06/21/2021 4:34 PM ?Indications: pain ?Details: 18 G 1.5 in needle, anteromedial approach ?Medications (Right): 5 mL lidocaine 1 %; 40 mg methylPREDNISolone acetate 40 MG/ML ?Medications (Left): 5 mL lidocaine 1 %; 40 mg methylPREDNISolone acetate 40 MG/ML ?Consent was given by the patient.  ? ? ? ?Clinical Data: ?No additional findings. ? ?ROS: ? ?All other systems negative, except as noted in the HPI. ?Review of Systems ? ?Objective: ?Vital Signs: LMP 03/05/2011  ? ?Specialty Comments:  ?No specialty comments available. ? ?PMFS History: ?Patient Active Problem List  ? Diagnosis Date Noted  ? Diabetes mellitus, type 2 ([place]) 12/26/2020  ? Hyperlipidemia 12/26/2020  ? Hyperthyroidism 06/13/2020  ? Osteoarthritis 03/17/2019  ? Bilateral pes planus 10/29/2018  ? Metatarsalgia of both feet 10/29/2018  ?  Onychomycosis due to dermatophyte 10/29/2018  ? Acute cholecystitis 05/02/2015  ? Abdominal pain 02/28/2012  ? Anemia 01/09/2012  ? Fatigue 01/09/2012  ? Hot flashes 01/09/2012  ? Morbid obesity ([place]) 01/09/2012  ? Neck pain 01/09/2012  ? Hot flashes due to surgical menopause 11/06/2011  ? Pain in right knee 11/06/2011  ? Iron deficiency anemia 11/06/2011  ? Cervical strain 11/27/2010  ? Preventative health care 11/06/2010  ? ?Past Medical History:  ?Diagnosis Date  ? Anemia   ? Arthritis   ? Diabetes mellitus without complication ([place])   ? Drug abuse ([place])   ? 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 Fibroids   ? Gall stones   ? Hypertension   ? Hyperthyroidism   ? Obesity   ? Thyroid disease   ?  ?Family History  ?Problem Relation Age of Onset  ? Diabetes type II Mother   ?     Not currently on insulin but had been at one time  ? Hypertension Mother   ? Hyperlipidemia Mother   ? Coronary artery disease Mother   ?     s/p stents  ? Hypertension Father   ? Hyperlipidemia Father   ? Prostate cancer Father 32  ?     s/p radiation  ? Stroke Father 50  ? Fibroids Sister   ?     s/p fibroidectomy 2/2 dub  ? Breast cancer Maternal Aunt   ? Thyroid disease Daughter   ? Colon cancer Neg Hx   ? Esophageal cancer Neg Hx   ? Rectal cancer Neg Hx   ? Stomach cancer Neg Hx   ?  ?Past Surgical History:  ?Procedure Laterality Date  ? ABDOMINAL HYSTERECTOMY  03/11/2011  ? Procedure: HYSTERECTOMY ABDOMINAL;  Surgeon: Juliene Pina C. Hulan Fray, MD;  Location: [facility];  Service: Gynecology;  Laterality: N/A;  Possible removal of tubes & ovaries  ? Bilateral tubal ligation  1996  ? CHOLECYSTECTOMY N/A 05/02/2015  ? Procedure: LAPAROSCOPIC CHOLECYSTECTOMY;  Surgeon: Ralene Ok, MD;  Location: [facility];  Service: General;  Laterality: N/A;  ? SALPINGOOPHORECTOMY  03/11/2011  ? Procedure: SALPINGO OOPHERECTOMY;  Surgeon: Myra C. Hulan Fray, MD;  Location: [facility];  Service: Gynecology;  Laterality: Bilateral;  ? SVD    ? x 3  ? TUBAL LIGATION N/A   ? Phreesia  04/30/2020  ? ?Social History  ? ?Occupational History  ? Not on file  ?Tobacco Use  ? Smoking status: Former  ?  Packs/day: 0.30  ?  Years: 35.00  ?  Pack years: 10.50  ?  Types: Cigarettes  ?  Quit date: 06/06/2010  ?  Years since quitting: 11.0  ? Smokeless tobacco: Never  ?  Vaping Use  ? Vaping Use: Never used  ?Substance and Sexual Activity  ? Alcohol use: No  ? Drug use: No  ? Sexual activity: Not Currently  ?  Birth control/protection: Surgical  ? ? ? ? ? ?

## 2021-06-25 ENCOUNTER — Telehealth: Payer: Self-pay

## 2021-06-25 NOTE — Telephone Encounter (Signed)
Called and left a VM advising patient that her insurance does not cover gel injections and to CB if she would like to proceed with TriVisc for bilateral knee. ?

## 2021-07-04 ENCOUNTER — Ambulatory Visit
Admission: EM | Admit: 2021-07-04 | Discharge: 2021-07-04 | Disposition: A | Discharge status: Home / Self Care | Payer: 59 | Attending: Emergency Medicine | Admitting: Emergency Medicine

## 2021-07-04 DIAGNOSIS — H66001 Acute suppurative otitis media without spontaneous rupture of ear drum, right ear: Secondary | ICD-10-CM

## 2021-07-04 DIAGNOSIS — B349 Viral infection, unspecified: Secondary | ICD-10-CM | POA: Diagnosis not present

## 2021-07-04 MED ORDER — PROMETHAZINE-DM 6.25-15 MG/5ML PO SYRP: 5.0000 mL | ORAL_SOLUTION | Freq: Four times a day (QID) | ORAL | 0 refills | Status: DC | PRN

## 2021-07-04 MED ORDER — FLUCONAZOLE 150 MG PO TABS: ORAL_TABLET | ORAL | 0 refills | Status: DC

## 2021-07-04 MED ORDER — CEFDINIR 300 MG PO CAPS
300.0000 mg | ORAL_CAPSULE | Freq: Two times a day (BID) | ORAL | 0 refills | Status: AC
Start: 2021-07-04 — End: 2021-07-14

## 2021-07-04 MED ORDER — IBUPROFEN 400 MG PO TABS: 400.0000 mg | ORAL_TABLET | Freq: Three times a day (TID) | ORAL | 0 refills | Status: DC | PRN

## 2021-07-04 NOTE — ED Provider Notes (Signed)
?[facility] ? ? ? ?CSN: [id number] ?Arrival date & time: 07/04/21  1101 ?  ? ?HISTORY  ? ?Chief Complaint  ?Patient presents with  ? Generalized Body Aches  ? Cough  ? Nasal Congestion  ? Sore Throat  ? ?HPI ?Kimberly Anderson is a 56 y.o. female. Patient presents to urgent care with a chief complaint of sore throat, chills, fatigue, congestion, headache, cough and generalized body aches that began 2 days ago.  Patient has elevated blood pressure on arrival.  Temperature is normal, heart rate is normal, respirations are normal and oxygen saturation is measured at 98%.  Patient denies known sick contacts.  Patient was seen at this clinic approximately 6 weeks ago at which time she was advised that she has seasonal allergies, patient was provided with prescriptions for cetirizine and Flonase. ? ?The history is provided by the patient.  ?Past Medical History:  ?Diagnosis Date  ? Anemia   ? Arthritis   ? Diabetes mellitus without complication ([place])   ? Drug abuse ([place])   ? 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 Fibroids   ? Gall stones   ? Hypertension   ? Hyperthyroidism   ? Obesity   ? Thyroid disease   ? ?Patient Active Problem List  ? Diagnosis Date Noted  ? Diabetes mellitus, type 2 ([place]) 12/26/2020  ? Hyperlipidemia 12/26/2020  ? Hyperthyroidism 06/13/2020  ? Osteoarthritis 03/17/2019  ? Bilateral pes planus 10/29/2018  ? Metatarsalgia of both feet 10/29/2018  ? Onychomycosis due to dermatophyte 10/29/2018  ? Acute cholecystitis 05/02/2015  ? Abdominal pain 02/28/2012  ? Anemia 01/09/2012  ? Fatigue 01/09/2012  ? Hot flashes 01/09/2012  ? Morbid obesity ([place]) 01/09/2012  ? Neck pain 01/09/2012  ? Hot flashes due to surgical menopause 11/06/2011  ? Pain in right knee 11/06/2011  ? Iron deficiency anemia 11/06/2011  ? Cervical strain 11/27/2010  ? Preventative health care 11/06/2010  ? ?Past Surgical History:  ?Procedure Laterality Date  ? ABDOMINAL HYSTERECTOMY  03/11/2011  ? Procedure: HYSTERECTOMY  ABDOMINAL;  Surgeon: Juliene Pina C. Hulan Fray, MD;  Location: [facility];  Service: Gynecology;  Laterality: N/A;  Possible removal of tubes & ovaries  ? Bilateral tubal ligation  1996  ? CHOLECYSTECTOMY N/A 05/02/2015  ? Procedure: LAPAROSCOPIC CHOLECYSTECTOMY;  Surgeon: Ralene Ok, MD;  Location: [facility];  Service: General;  Laterality: N/A;  ? SALPINGOOPHORECTOMY  03/11/2011  ? Procedure: SALPINGO OOPHERECTOMY;  Surgeon: Myra C. Hulan Fray, MD;  Location: [facility];  Service: Gynecology;  Laterality: Bilateral;  ? SVD    ? x 3  ? TUBAL LIGATION N/A   ? Phreesia 04/30/2020  ? ?OB History   ? ? Gravida  ?4  ? Para  ?3  ? Term  ?3  ? Preterm  ?0  ? AB  ?1  ? Living  ?   ?  ? ? SAB  ?1  ? IAB  ?   ? Ectopic  ?   ? Multiple  ?   ? Live Births  ?   ?   ?  ?  ? ?Home Medications   ? ?Prior to Admission medications   ?Medication Sig Start Date End Date Taking? Authorizing Provider  ?acetaminophen (TYLENOL) 500 MG tablet Take 2 tablets (1,000 mg total) by mouth every 8 (eight) hours as needed for mild pain. 05/25/21 08/23/21  Lynden [place] Scales, PA-C  ?atorvastatin (LIPITOR) 20 MG tablet TAKE 1 TABLET BY MOUTH EVERY DAY 05/14/21   Camillia Herter,  NP  ?cetirizine (ZYRTEC ALLERGY) 10 MG tablet Take 1 tablet (10 mg total) by mouth at bedtime. 05/25/21 08/23/21  Lynden [place] Scales, PA-C  ?Dulaglutide (TRULICITY) 7.61 YW/7.3XT SOPN Inject 0.75 mg into the skin once a week. 05/25/21 08/17/21  Lynden [place] Scales, PA-C  ?ergocalciferol (VITAMIN D2) 1.25 MG (50000 UT) capsule ergocalciferol (vitamin D2) 1,250 mcg (50,000 unit) capsule    [provider]  ?estradiol (ESTRACE) 0.5 MG tablet Take 0.5 mg by mouth daily. 04/17/21   [provider]  ?fluticasone (FLONASE) 50 MCG/ACT nasal spray Place 2 sprays into both nostrils daily. 05/25/21   Lynden [place] Scales, PA-C  ?hydrochlorothiazide (HYDRODIURIL) 50 MG tablet Take 0.5 tablets (25 mg total) by mouth daily. 12/25/20 02/23/21  Camillia Herter, NP  ?ibuprofen (ADVIL) 400 MG tablet  Take 1 tablet (400 mg total) by mouth every 8 (eight) hours as needed. 05/25/21 08/23/21  Lynden [place] Scales, PA-C  ?lidocaine (XYLOCAINE) 2 % solution Use as directed 15 mLs in the mouth or throat as needed for mouth pain. 05/28/20   Hazel Sams, PA-C  ?linagliptin (TRADJENTA) 5 MG TABS tablet Take 1 tablet (5 mg total) by mouth daily. 05/25/21 08/23/21  Lynden [place] Scales, PA-C  ?methylPREDNISolone (MEDROL DOSEPAK) 4 MG TBPK tablet Take 24 mg on day 1, 20 mg on day 2, 16 mg on day 3, 12 mg on day 4, 8 mg on day 5, 4 mg on day 6. 05/25/21   Lynden [place] Scales, PA-C  ?Multiple Vitamin (MULTIVITAMIN) capsule Take 1 capsule by mouth daily.    [provider]  ? ?Family History ?Family History  ?Problem Relation Age of Onset  ? Diabetes type II Mother   ?     Not currently on insulin but had been at one time  ? Hypertension Mother   ? Hyperlipidemia Mother   ? Coronary artery disease Mother   ?     s/p stents  ? Hypertension Father   ? Hyperlipidemia Father   ? Prostate cancer Father 34  ?     s/p radiation  ? Stroke Father 42  ? Fibroids Sister   ?     s/p fibroidectomy 2/2 dub  ? Breast cancer Maternal Aunt   ? Thyroid disease Daughter   ? Colon cancer Neg Hx   ? Esophageal cancer Neg Hx   ? Rectal cancer Neg Hx   ? Stomach cancer Neg Hx   ? ?Social History ?Social History  ? ?Tobacco Use  ? Smoking status: Former  ?  Packs/day: 0.30  ?  Years: 35.00  ?  Pack years: 10.50  ?  Types: Cigarettes  ?  Quit date: 06/06/2010  ?  Years since quitting: 11.0  ? Smokeless tobacco: Never  ?Vaping Use  ? Vaping Use: Never used  ?Substance Use Topics  ? Alcohol use: No  ? Drug use: No  ? ?Allergies   ?Patient has no known allergies. ? ?Review of Systems ?Review of Systems ?Pertinent findings noted in history of present illness.  ? ?Physical Exam ?Triage Vital Signs ?ED Triage Vitals  ?Enc Vitals Group  ?   BP 12/29/20 0827 (!) 147/82  ?   Pulse Rate 12/29/20 0827 72  ?   Resp 12/29/20 0827 18  ?   Temp 12/29/20  0827 98.3 ?F (36.8 ?C)  ?   Temp Source 12/29/20 0827 Oral  ?   SpO2 12/29/20 0827 98 %  ?   Weight --   ?  Height --   ?   Head Circumference --   ?   Peak Flow --   ?   Pain Score 12/29/20 0826 5  ?   Pain Loc --   ?   Pain Edu? --   ?   Excl. in [place]? --   ?No data found. ? ?Updated Vital Signs ?BP (!) 155/98 (BP Location: Right Arm)   Pulse 82   Temp 97.6 ?F (36.4 ?C) (Oral)   Resp 20   LMP 03/05/2011   SpO2 98%  ? ?Physical Exam ?Vitals and nursing note reviewed.  ?Constitutional:   ?   General: She is not in acute distress. ?   Appearance: Normal appearance. She is not ill-appearing.  ?HENT:  ?   Head: Normocephalic and atraumatic.  ?   Salivary Glands: Right salivary gland is not diffusely enlarged or tender. Left salivary gland is not diffusely enlarged or tender.  ?   Right Ear: Ear canal and external ear normal. No drainage. A middle ear effusion (Suppur[place]) is present. There is no impacted cerumen. Tympanic membrane is bulging. Tympanic membrane is not injected or erythematous.  ?   Left Ear: Ear canal and external ear normal. No drainage. A middle ear effusion is present. There is no impacted cerumen. Tympanic membrane is bulging. Tympanic membrane is not injected or erythematous.  ?   Ears:  ?   Comments: Bilateral EACs normal, left TM bulging with clear fluid ?   Nose: Rhinorrhea present. No nasal deformity, septal deviation, signs of injury, nasal tenderness, mucosal edema or congestion. Rhinorrhea is clear.  ?   Right Nostril: Occlusion present. No foreign body, epistaxis or septal hematoma.  ?   Left Nostril: Occlusion present. No foreign body, epistaxis or septal hematoma.  ?   Right Turbinates: Enlarged, swollen and pale.  ?   Left Turbinates: Enlarged, swollen and pale.  ?   Right Sinus: No maxillary sinus tenderness or frontal sinus tenderness.  ?   Left Sinus: No maxillary sinus tenderness or frontal sinus tenderness.  ?   Mouth/Throat:  ?   Lips: Pink. No lesions.  ?   Mouth: Mucous  membranes are moist. No oral lesions.  ?   Pharynx: Oropharynx is clear. Uvula midline. No posterior oropharyngeal erythema or uvula swelling.  ?   Tonsils: No tonsillar exudate. 0 on the right. 0 on the left

## 2021-07-04 NOTE — ED Triage Notes (Signed)
Pt c/o sore throat, chills, fatigue, congestion, headache, cough and generalized body aches that began Monday. ?

## 2021-07-04 NOTE — Discharge Instructions (Addendum)
Please begin cefdinir to treat the acute infection in your right ear. ? ?The results of your COVID-19 test will be available in the next 36 to 48 hours.  Initially it will be posted to your MyChart account and if it is positive, you will receive a phone call with further instructions.  Please let the nurse know if you are interested in taking Paxlovid, the antiviral medication for COVID-19.  Because you have a history of anemia, thyroid disease, hypertension and type 2 diabetes, I recommend Paxlovid for treatment of COVID-19. ? ?Please see the list below for recommended medications, dosages and frequencies to provide relief of your current symptoms:   ?  ?Cefdinir (Omnicef):  1 capsule twice daily for 10 days, you can take it with or without food.  This antibiotic can cause upset stomach, this will resolve once antibiotics are complete.  You are welcome to use a probiotic, eat yogurt, take Imodium while taking this medication.  Please avoid other systemic medications such as Maalox, Pepto-Bismol or milk of magnesia as they can interfere with your body's ability to absorb the antibiotics. ?  ?It is very important that you take antibiotics as prescribed.  If you skip doses or do not complete the full course of antibiotics, you put yourself at significant risk of recurrent infection which can often be worse than your initial infection. ?  ?Fluconazole (Diflucan): As you may or may not be aware, taking antibiotics can often cause patients to develop a vaginal yeast infection.  For this reason, I have provided you with a prescription for Diflucan.  Please take the first Diflucan tablet on day 3 or 4 of your antibiotic therapy, and take the second Diflucan tablet 3 days later.  You do not need to pick up this prescription or take this medication unless you develop symptoms of vaginal yeast infection including thick, white vaginal discharge and/or vaginal itching.  This prescription has been provided as a Manufacturing engineer and for  your convenience. ?  ?Ibuprofen  (Advil, Motrin): This is a good anti-inflammatory medication which addresses aches, pains and inflammation of the upper airways that causes sinus and nasal congestion as well as in the lower airways which makes your cough feel tight and sometimes burn.  I recommend that you take between 400 to 600 mg every 6-8 hours as needed, I have provided you with a prescription for 400 mg.    ?  ?Fluticasone (Flonase): This is a steroid nasal spray that you use once daily, 1 spray in each nare.  After 3 to 5 days of use, you will have significant improvement of the inflammation and mucus production that is being caused by exposure to allergens.  This medication can be purchased over-the-counter however I have provided you with a prescription.    ?  ?Cetirizine (Zyrtec): This is an excellent second-generation antihistamine that helps to reduce respiratory inflammatory response to viruses and environmental allergens.  Please take 1 tablet daily at bedtime. ?  ?Promethazine DM: Promethazine is both a nasal decongestant and an antinausea medication that makes most patients feel fairly sleepy.  The DM is dextromethorphan, a cough suppressant found in many over-the-counter cough medications.  Please take 5 mL before bedtime to minimize your cough which will help you sleep better.  I have provided you with a prescription for this medication.    ?  ?Please follow-up within the next 7-10 days either with your primary care provider or urgent care if your symptoms do not resolve.  If  you do not have a primary care provider, we will assist you in finding one. ?  ?Thank you for visiting urgent care today.  We appreciate the opportunity to participate in your care. ? ?

## 2021-07-05 LAB — NOVEL CORONAVIRUS, NAA: SARS-CoV-2, NAA: NOT DETECTED

## 2021-07-07 NOTE — Progress Notes (Signed)
Erroneous encounter-disregard

## 2021-07-10 ENCOUNTER — Encounter: Payer: 59 | Admitting: Family

## 2021-07-10 DIAGNOSIS — E119 Type 2 diabetes mellitus without complications: Secondary | ICD-10-CM

## 2021-07-10 DIAGNOSIS — I10 Essential (primary) hypertension: Secondary | ICD-10-CM

## 2021-07-10 DIAGNOSIS — E785 Hyperlipidemia, unspecified: Secondary | ICD-10-CM

## 2021-07-15 ENCOUNTER — Emergency Department (HOSPITAL_COMMUNITY)
Admission: EM | Admit: 2021-07-15 | Discharge: 2021-07-15 | Disposition: A | Discharge status: Home / Self Care | Payer: Worker's Compensation | Attending: Emergency Medicine | Admitting: Emergency Medicine

## 2021-07-15 ENCOUNTER — Emergency Department (HOSPITAL_COMMUNITY): Payer: Worker's Compensation

## 2021-07-15 ENCOUNTER — Encounter (HOSPITAL_COMMUNITY): Payer: Self-pay | Admitting: Emergency Medicine

## 2021-07-15 ENCOUNTER — Other Ambulatory Visit: Payer: Self-pay

## 2021-07-15 DIAGNOSIS — I10 Essential (primary) hypertension: Secondary | ICD-10-CM | POA: Insufficient documentation

## 2021-07-15 DIAGNOSIS — S0990XA Unspecified injury of head, initial encounter: Secondary | ICD-10-CM | POA: Diagnosis present

## 2021-07-15 DIAGNOSIS — Z79899 Other long term (current) drug therapy: Secondary | ICD-10-CM | POA: Insufficient documentation

## 2021-07-15 DIAGNOSIS — E119 Type 2 diabetes mellitus without complications: Secondary | ICD-10-CM | POA: Diagnosis not present

## 2021-07-15 DIAGNOSIS — Y990 Civilian activity done for income or pay: Secondary | ICD-10-CM | POA: Insufficient documentation

## 2021-07-15 DIAGNOSIS — W1789XA Other fall from one level to another, initial encounter: Secondary | ICD-10-CM | POA: Diagnosis not present

## 2021-07-15 DIAGNOSIS — W19XXXA Unspecified fall, initial encounter: Secondary | ICD-10-CM

## 2021-07-15 MED ORDER — OXYCODONE-ACETAMINOPHEN 5-325 MG PO TABS
1.0000 | ORAL_TABLET | Freq: Once | ORAL | Status: AC
  Administered 2021-07-15: 1 via ORAL
  Filled 2021-07-15: qty 1

## 2021-07-15 MED ORDER — DIPHENHYDRAMINE HCL 25 MG PO TABS: 25.0000 mg | ORAL_TABLET | Freq: Four times a day (QID) | ORAL | 0 refills | Status: DC | PRN

## 2021-07-15 MED ORDER — METOCLOPRAMIDE HCL 10 MG PO TABS: 10.0000 mg | ORAL_TABLET | Freq: Four times a day (QID) | ORAL | 0 refills | Status: AC

## 2021-07-15 NOTE — ED Notes (Signed)
Pt verbalizes understanding of discharge instructions. Opportunity for questions and answers were provided. Pt discharged from the ED.   ?

## 2021-07-15 NOTE — ED Triage Notes (Signed)
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  She fell backwards onto the bed.  C/o pain to back, hips, and legs.   ?

## 2021-07-15 NOTE — ED Notes (Signed)
Back from CT

## 2021-07-15 NOTE — Discharge Instructions (Signed)
The CT scan of your head and neck was without any fractures or brain bleeding. ? ?Please drink plenty of water use Reglan which is a medicine I prescribed you for nausea as needed.  Take a 25 mg Benadryl tablet with this.  Tylenol and ibuprofen as discussed below.  Please follow-up with your primary care doctor ? ?Please use Tylenol or ibuprofen for pain.  You may use 600 mg ibuprofen every 6 hours or 1000 mg of Tylenol every 6 hours.  You may choose to alternate between the 2.  This would be most effective.  Not to exceed 4 g of Tylenol within 24 hours.  Not to exceed 3200 mg ibuprofen 24 hours.  ?

## 2021-07-15 NOTE — ED Notes (Signed)
Patient transported to CT 

## 2021-07-15 NOTE — ED Provider Notes (Addendum)
?[facility] ?Provider Note ? ? ?CSN: [id number] ?Arrival date & time: 07/15/21  1251 ? ?  ? ?History ? ?Chief Complaint  ?Patient presents with  ? Fall  ? ? ?Kimberly Anderson is a 56 y.o. female. ? ? ?Fall ?Patient is a 56 year old female with past medical history significant for obesity, thyroid disease, DM 2, hypertension, fibroids, drug abuse ? ?Patient is presented emergency room today with complaints of whole body pain.  On additional questioning it seems that she is primarily presenting for headache.  She was backing down the hallway while rolling a hospital bed and fell onto another hospital bed that was parked behind where she was walking she informs me that there was no mattress/padding on this bed and there was a metal bed frame that she fell upon. ? ?No LOC, no blood thinner use ? ? ?  ? ?Home Medications ?Prior to Admission medications   ?Medication Sig Start Date End Date Taking? Authorizing Provider  ?acetaminophen (TYLENOL) 500 MG tablet Take 2 tablets (1,000 mg total) by mouth every 8 (eight) hours as needed for mild pain. 05/25/21 08/23/21  Lynden Oxford Scales, PA-C  ?atorvastatin (LIPITOR) 20 MG tablet TAKE 1 TABLET BY MOUTH EVERY DAY 05/14/21   Camillia Herter, NP  ?cetirizine (ZYRTEC ALLERGY) 10 MG tablet Take 1 tablet (10 mg total) by mouth at bedtime. 05/25/21 08/23/21  Lynden Oxford Scales, PA-C  ?Dulaglutide (TRULICITY) 5.39 JQ/7.3AL SOPN Inject 0.75 mg into the skin once a week. 05/25/21 08/17/21  Lynden Oxford Scales, PA-C  ?ergocalciferol (VITAMIN D2) 1.25 MG (50000 UT) capsule ergocalciferol (vitamin D2) 1,250 mcg (50,000 unit) capsule    [provider]  ?estradiol (ESTRACE) 0.5 MG tablet Take 0.5 mg by mouth daily. 04/17/21   [provider]  ?fluconazole (DIFLUCAN) 150 MG tablet Take 1 tablet on day 4 of antibiotics.  Take second tablet 3 days later. 07/04/21   Lynden Oxford Scales, PA-C  ?fluticasone (FLONASE) 50 MCG/ACT nasal  spray Place 2 sprays into both nostrils daily. 05/25/21   Lynden Oxford Scales, PA-C  ?hydrochlorothiazide (HYDRODIURIL) 50 MG tablet Take 0.5 tablets (25 mg total) by mouth daily. 12/25/20 02/23/21  Camillia Herter, NP  ?ibuprofen (ADVIL) 400 MG tablet Take 1 tablet (400 mg total) by mouth every 8 (eight) hours as needed for up to 30 doses. 07/04/21   Lynden Oxford Scales, PA-C  ?lidocaine (XYLOCAINE) 2 % solution Use as directed 15 mLs in the mouth or throat as needed for mouth pain. 05/28/20   Hazel Sams, PA-C  ?linagliptin (TRADJENTA) 5 MG TABS tablet Take 1 tablet (5 mg total) by mouth daily. 05/25/21 08/23/21  Lynden Oxford Scales, PA-C  ?methylPREDNISolone (MEDROL DOSEPAK) 4 MG TBPK tablet Take 24 mg on day 1, 20 mg on day 2, 16 mg on day 3, 12 mg on day 4, 8 mg on day 5, 4 mg on day 6. 05/25/21   Lynden Oxford Scales, PA-C  ?Multiple Vitamin (MULTIVITAMIN) capsule Take 1 capsule by mouth daily.    [provider]  ?promethazine-dextromethorphan (PROMETHAZINE-DM) 6.25-15 MG/5ML syrup Take 5 mLs by mouth 4 (four) times daily as needed for cough. 07/04/21   Lynden Oxford Scales, PA-C  ?   ? ?Allergies    ?Patient has no known allergies.   ? ?Review of Systems   ?Review of Systems ? ?Physical Exam ?Updated Vital Signs ?BP (!) 145/87 (BP Location: Right Arm)   Pulse 91   Temp 98.3 ?F (36.8 ?  C) (Oral)   Resp 16   LMP 03/05/2011   SpO2 100%  ?Physical Exam ?Vitals and nursing note reviewed.  ?Constitutional:   ?   General: She is not in acute distress. ?   Comments: Pleasant well-appearing 56 year old.  In no acute distress.  Sitting comfortably in bed.  Able answer questions appropriately follow commands. No increased work of breathing. Speaking in full sentences.   ?HENT:  ?   Head: Normocephalic and atraumatic.  ?   Nose: Nose normal.  ?   Mouth/Throat:  ?   Mouth: Mucous membranes are moist.  ?Eyes:  ?   General: No scleral icterus. ?Cardiovascular:  ?   Rate and Rhythm: Normal rate and  regular rhythm.  ?   Pulses: Normal pulses.  ?   Heart sounds: Normal heart sounds.  ?Pulmonary:  ?   Effort: Pulmonary effort is normal. No respiratory distress.  ?   Breath sounds: No wheezing.  ?Abdominal:  ?   Palpations: Abdomen is soft.  ?   Tenderness: There is no abdominal tenderness. There is no guarding or rebound.  ?Musculoskeletal:  ?   Cervical back: Normal range of motion.  ?   Right lower leg: No edema.  ?   Left lower leg: No edema.  ?   Comments: Patient endorses pain throughout the entire body however it does not seem to be any T or L-spine tenderness.  There is some discomfort with palpation of her paracervical musculature and some tenderness of the midline C-spine. ? ?No other notable upper or lower extremity tenderness or chest wall tenderness  ?Skin: ?   General: Skin is warm and dry.  ?   Capillary Refill: Capillary refill takes less than 2 seconds.  ?Neurological:  ?   Mental Status: She is alert. Mental status is at baseline.  ?   Comments: Alert and oriented to self, place, time and event.  ? ?Speech is fluent, clear without dysarthria or dysphasia.  ? ?Strength 5/5 in upper/lower extremities   ?Sensation intact in upper/lower extremities  ? ?Normal gait.  ?CN I not tested  ?CN II grossly intact visual fields bilaterally. Did not visualize posterior eye.  ?CN III, IV, VI PERRLA and EOMs intact bilaterally  ?CN V Intact sensation to sharp and light touch to the face  ?CN VII facial movements symmetric  ?CN VIII not tested  ?CN IX, X no uvula deviation, symmetric rise of soft palate  ?CN XI 5/5 SCM and trapezius strength bilaterally  ?CN XII Midline tongue protrusion, symmetric L/R movements  ?   ?Psychiatric:     ?   Mood and Affect: Mood normal.     ?   Behavior: Behavior normal.  ? ? ?ED Results / Procedures / Treatments   ?Labs ?(all labs ordered are listed, but only abnormal results are displayed) ?Labs Reviewed - No data to display ? ?EKG ?None ? ?Radiology ?No results  found. ? ?Procedures ?Procedures  ? ? ?Medications Ordered in ED ?Medications  ?oxyCODONE-acetaminophen (PERCOCET/ROXICET) 5-325 MG per tablet 1 tablet (has no administration in time range)  ? ? ?ED Course/ Medical Decision Making/ A&P ?  ?                        ?Medical Decision Making ?Amount and/or Complexity of Data Reviewed ?Radiology: ordered. ? ?Risk ?OTC drugs. ?Prescription drug management. ? ? ?Patient is a 56 year old female with past medical history significant for obesity, thyroid disease, DM  2, hypertension, fibroids, drug abuse ? ?Patient is presented emergency room today with complaints of whole body pain.  On additional questioning it seems that she is primarily presenting for headache.  She was backing down the hallway while rolling a hospital bed and fell onto another hospital bed that was parked behind where she was walking she informs me that there was no mattress/padding on this bed and there was a metal bed frame that she fell upon. ? ?No LOC, no blood thinner use ? ?Physical exam is relatively unremarkable.  Patient is endorsing discomfort with palpation of her body head to toe however seems very well-appearing and does not seem to be particularly uncomfortable.  A lengthy shared decision-making conversation with the patient I will only CT scan her head and cervical spine. ? ? ?I anticipate that these images will be without any acute abnormality.  If this is the case we will discharge home.  With Tylenol and ibuprofen. ?I have written her discharge instructions.  Patient care handed off to oncoming PA Larry at time of shift change.  We will follow-up on CT C-spine and CT head.  Anticipate discharge home. ? ? ?Final Clinical Impression(s) / ED Diagnoses ?Final diagnoses:  ?Fall, initial encounter  ?Injury of head, initial encounter  ? ? ?Rx / DC Orders ?ED Discharge Orders   ? ? None  ? ?  ? ? ?  ?Tedd Sias, [place] ?07/15/21 1504 ? ?  ?Carmin Muskrat, MD ?07/15/21 1521 ? ? ?CT head and  C-spine resulted prior to shift change.  I reviewed these images personally I agree with radiology read there is no intracranial hemorrhage or cervical spine fracture ? ?I discussed this with patient and she is understanding o

## 2021-07-17 ENCOUNTER — Encounter (HOSPITAL_COMMUNITY): Payer: Self-pay

## 2021-07-17 ENCOUNTER — Other Ambulatory Visit: Payer: Self-pay

## 2021-07-17 ENCOUNTER — Emergency Department (HOSPITAL_COMMUNITY): Payer: Worker's Compensation

## 2021-07-17 ENCOUNTER — Emergency Department (HOSPITAL_COMMUNITY)
Admission: EM | Admit: 2021-07-17 | Discharge: 2021-07-17 | Disposition: A | Discharge status: Home / Self Care | Payer: Worker's Compensation | Attending: Emergency Medicine | Admitting: Emergency Medicine

## 2021-07-17 DIAGNOSIS — M25512 Pain in left shoulder: Secondary | ICD-10-CM | POA: Insufficient documentation

## 2021-07-17 DIAGNOSIS — W1809XA Striking against other object with subsequent fall, initial encounter: Secondary | ICD-10-CM | POA: Insufficient documentation

## 2021-07-17 DIAGNOSIS — M25511 Pain in right shoulder: Secondary | ICD-10-CM | POA: Insufficient documentation

## 2021-07-17 DIAGNOSIS — E119 Type 2 diabetes mellitus without complications: Secondary | ICD-10-CM | POA: Diagnosis not present

## 2021-07-17 DIAGNOSIS — I10 Essential (primary) hypertension: Secondary | ICD-10-CM | POA: Diagnosis not present

## 2021-07-17 DIAGNOSIS — M545 Low back pain, unspecified: Secondary | ICD-10-CM | POA: Insufficient documentation

## 2021-07-17 DIAGNOSIS — Y990 Civilian activity done for income or pay: Secondary | ICD-10-CM | POA: Insufficient documentation

## 2021-07-17 DIAGNOSIS — Z79899 Other long term (current) drug therapy: Secondary | ICD-10-CM | POA: Insufficient documentation

## 2021-07-17 DIAGNOSIS — S0990XA Unspecified injury of head, initial encounter: Secondary | ICD-10-CM | POA: Diagnosis present

## 2021-07-17 DIAGNOSIS — G8929 Other chronic pain: Secondary | ICD-10-CM | POA: Insufficient documentation

## 2021-07-17 MED ORDER — LIDOCAINE 5 % EX PTCH
1.0000 | MEDICATED_PATCH | CUTANEOUS | Status: DC
  Administered 2021-07-17: 1 via TRANSDERMAL
  Filled 2021-07-17: qty 1

## 2021-07-17 MED ORDER — IBUPROFEN 200 MG PO TABS
600.0000 mg | ORAL_TABLET | Freq: Once | ORAL | Status: AC
  Administered 2021-07-17: 600 mg via ORAL
  Filled 2021-07-17: qty 3

## 2021-07-17 MED ORDER — HYDRALAZINE HCL 25 MG PO TABS
25.0000 mg | ORAL_TABLET | Freq: Once | ORAL | Status: AC
  Administered 2021-07-17: 25 mg via ORAL
  Filled 2021-07-17: qty 1

## 2021-07-17 NOTE — ED Triage Notes (Signed)
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 ?Patient states she began having vomiting this AM. Patient also c/o bilateral hip pain, posterior neck and "soreness in her abdomen and shoulders." ?

## 2021-07-17 NOTE — ED Provider Notes (Addendum)
?[facility] DEPT ?Provider Note ? ? ?CSN: [id number] ?Arrival date & time: 07/17/21  1046 ? ?  ? ?History ? ?Chief Complaint  ?Patient presents with  ? Fall  ? Head Injury  ? Emesis  ? ? ?Kimberly Anderson is a 56 y.o. female with medical history of anemia, arthritis, diabetes, fibroids, hypertension, drug abuse.  Patient presents ED for evaluation of whole body pain.  On more particular questioning, it appears that the patient is here mainly for concerns of bilateral shoulder pain and low back pain.  The patient is requesting that I x-ray her entire body.  Patient was seen on 5/14 at [facility] ED for chief complaint of whole body pain as well.  Patient states that day prior, on 5/13, she was backing down the hallway with rolling hospital bed and she fell into another hospital bed that was parked behind her.  Patient states that there is no mattress/padding on this bed and there was a bed frame that she fell in to.  The patient denies hitting her head, denies loss of consciousness, denies blood thinner use.  Patient states since this time she has had bilateral shoulder pain and low back pain.  On chart review, it appears that this patient has a history of chronic low back pain.  On 5/14 at [facility], the patient received a CT scan of her C-spine as well as her head which did not show any abnormalities.  Patient requesting pain medication continuously asking "what you gonna give me for pain?".  Patient denies any nausea or vomiting this morning despite the fact that she advised the triage nurse that she did have nausea and vomiting.  Patient denies any bowel or bladder incontinence, groin numbness, lower extremity weakness.  Patient denies any chest pain, shortness of breath, blurred vision. ? ? ?Fall ?Pertinent negatives include no chest pain and no shortness of breath.  ?Head Injury ?Associated symptoms: no nausea and no vomiting   ?Emesis ?Associated symptoms: arthralgias and  myalgias   ? ?  ? ?Home Medications ?Prior to Admission medications   ?Medication Sig Start Date End Date Taking? Authorizing Provider  ?acetaminophen (TYLENOL) 500 MG tablet Take 2 tablets (1,000 mg total) by mouth every 8 (eight) hours as needed for mild pain. 05/25/21 08/23/21  Lynden Oxford Scales, PA-C  ?atorvastatin (LIPITOR) 20 MG tablet TAKE 1 TABLET BY MOUTH EVERY DAY 05/14/21   Camillia Herter, NP  ?cetirizine (ZYRTEC ALLERGY) 10 MG tablet Take 1 tablet (10 mg total) by mouth at bedtime. 05/25/21 08/23/21  Lynden Oxford Scales, PA-C  ?diphenhydrAMINE (BENADRYL) 25 MG tablet Take 1 tablet (25 mg total) by mouth every 6 (six) hours as needed. 07/15/21   Tedd Sias, PA  ?Dulaglutide (TRULICITY) 4.09 WJ/1.9JY SOPN Inject 0.75 mg into the skin once a week. 05/25/21 08/17/21  Lynden Oxford Scales, PA-C  ?ergocalciferol (VITAMIN D2) 1.25 MG (50000 UT) capsule ergocalciferol (vitamin D2) 1,250 mcg (50,000 unit) capsule    [provider]  ?estradiol (ESTRACE) 0.5 MG tablet Take 0.5 mg by mouth daily. 04/17/21   [provider]  ?fluconazole (DIFLUCAN) 150 MG tablet Take 1 tablet on day 4 of antibiotics.  Take second tablet 3 days later. 07/04/21   Lynden Oxford Scales, PA-C  ?fluticasone (FLONASE) 50 MCG/ACT nasal spray Place 2 sprays into both nostrils daily. 05/25/21   Lynden Oxford Scales, PA-C  ?hydrochlorothiazide (HYDRODIURIL) 50 MG tablet Take 0.5 tablets (25 mg total) by mouth daily. 12/25/20 02/23/21  Camillia Herter, NP  ?ibuprofen (ADVIL) 400 MG tablet Take 1 tablet (400 mg total) by mouth every 8 (eight) hours as needed for up to 30 doses. 07/04/21   Lynden Oxford Scales, PA-C  ?lidocaine (XYLOCAINE) 2 % solution Use as directed 15 mLs in the mouth or throat as needed for mouth pain. 05/28/20   Hazel Sams, PA-C  ?linagliptin (TRADJENTA) 5 MG TABS tablet Take 1 tablet (5 mg total) by mouth daily. 05/25/21 08/23/21  Lynden Oxford Scales, PA-C  ?methylPREDNISolone (MEDROL  DOSEPAK) 4 MG TBPK tablet Take 24 mg on day 1, 20 mg on day 2, 16 mg on day 3, 12 mg on day 4, 8 mg on day 5, 4 mg on day 6. 05/25/21   Lynden Oxford Scales, PA-C  ?metoCLOPramide (REGLAN) 10 MG tablet Take 1 tablet (10 mg total) by mouth every 6 (six) hours. 07/15/21   Tedd Sias, PA  ?Multiple Vitamin (MULTIVITAMIN) capsule Take 1 capsule by mouth daily.    [provider]  ?promethazine-dextromethorphan (PROMETHAZINE-DM) 6.25-15 MG/5ML syrup Take 5 mLs by mouth 4 (four) times daily as needed for cough. 07/04/21   Lynden Oxford Scales, PA-C  ?   ? ?Allergies    ?Patient has no known allergies.   ? ?Review of Systems   ?Review of Systems  ?Eyes:  Negative for visual disturbance.  ?Respiratory:  Negative for shortness of breath.   ?Cardiovascular:  Negative for chest pain.  ?Gastrointestinal:  Negative for nausea and vomiting.  ?Musculoskeletal:  Positive for arthralgias, back pain and myalgias.  ?Neurological:  Negative for syncope.  ?All other systems reviewed and are negative. ? ?Physical Exam ?Updated Vital Signs ?BP (!) 169/105 (BP Location: Left Arm)   Pulse 82   Temp (!) 97.5 ?F (36.4 ?C) (Oral)   Resp 18   Ht '5\' 9"'$  (1.753 m)   Wt 105.7 kg   LMP 03/05/2011   SpO2 100%   BMI 34.41 kg/m?  ?Physical Exam ?Vitals and nursing note reviewed.  ?Constitutional:   ?   General: She is not in acute distress. ?   Appearance: Normal appearance. She is not ill-appearing, toxic-appearing or diaphoretic.  ?HENT:  ?   Head: Normocephalic and atraumatic.  ?   Nose: Nose normal. No congestion.  ?   Mouth/Throat:  ?   Mouth: Mucous membranes are moist.  ?   Pharynx: Oropharynx is clear.  ?Eyes:  ?   Extraocular Movements: Extraocular movements intact.  ?   Conjunctiva/sclera: Conjunctivae normal.  ?   Pupils: Pupils are equal, round, and reactive to light.  ?Cardiovascular:  ?   Rate and Rhythm: Normal rate and regular rhythm.  ?Pulmonary:  ?   Effort: Pulmonary effort is normal.  ?   Breath sounds: Normal  breath sounds. No wheezing.  ?Abdominal:  ?   General: Abdomen is flat. Bowel sounds are normal.  ?   Palpations: Abdomen is soft.  ?   Tenderness: There is no abdominal tenderness.  ?Musculoskeletal:  ?   Right shoulder: Tenderness present. No swelling or deformity. Normal range of motion.  ?   Left shoulder: Tenderness present. No swelling or deformity. Normal range of motion.  ?   Cervical back: Normal range of motion and neck supple. No tenderness.  ?   Lumbar back: Tenderness present. No swelling or deformity. Normal range of motion.  ?   Comments: Patient has tenderness to bilateral shoulders.  No deformity, swelling, decreased range of motion noted.  Patient  also has diffuse tenderness to her entire low back, lumbar spine.  Patient denies red flag symptoms.  ?Skin: ?   General: Skin is warm.  ?   Capillary Refill: Capillary refill takes less than 2 seconds.  ?Neurological:  ?   General: No focal deficit present.  ?   Mental Status: She is alert and oriented to person, place, and time.  ?   GCS: GCS eye subscore is 4. GCS verbal subscore is 5. GCS motor subscore is 6.  ?   Cranial Nerves: Cranial nerves 2-12 are intact. No cranial nerve deficit.  ?   Sensory: Sensation is intact. No sensory deficit.  ?   Motor: Motor function is intact. No weakness.  ?   Coordination: Coordination is intact. Heel to [facility] Test normal.  ? ? ?ED Results / Procedures / Treatments   ?Labs ?(all labs ordered are listed, but only abnormal results are displayed) ?Labs Reviewed - No data to display ? ?EKG ?None ? ?Radiology ?DG Lumbar Spine Complete ? ?Result Date: 07/17/2021 ?CLINICAL DATA:  Status post fall back pain. EXAM: LUMBAR SPINE - COMPLETE 4+ VIEW COMPARISON:  None Available. FINDINGS: There is no evidence of lumbar spine fracture. Alignment is normal. Degenerative disease of the lumbar spine prominent at L5-S1 with associated facet joint arthropathy. IMPRESSION: 1.  No evidence of acute fracture or subluxation. 2.  Mild-to-moderate degenerative disc disease of the lumbar spine prominent at L5-S1 with associated facet joint arthropathy. Electronically Signed   By: Keane Police D.O.   On: 07/17/2021 12:44  ? ?DG Shoulder Right ? ?Result Date:

## 2021-07-17 NOTE — Discharge Instructions (Signed)
Return to ED with any new symptoms such as loss of consciousness ?Follow-up with your PCP for any ongoing needs ?

## 2021-10-01 ENCOUNTER — Ambulatory Visit: Payer: 59 | Admitting: Dermatology

## 2021-12-26 ENCOUNTER — Ambulatory Visit: Payer: Self-pay | Admitting: Family

## 2021-12-31 ENCOUNTER — Ambulatory Visit (INDEPENDENT_AMBULATORY_CARE_PROVIDER_SITE_OTHER): Payer: Commercial Managed Care - HMO | Admitting: Dermatology

## 2021-12-31 DIAGNOSIS — L700 Acne vulgaris: Secondary | ICD-10-CM

## 2021-12-31 DIAGNOSIS — L730 Acne keloid: Secondary | ICD-10-CM

## 2021-12-31 DIAGNOSIS — L819 Disorder of pigmentation, unspecified: Secondary | ICD-10-CM

## 2021-12-31 MED ORDER — TRETINOIN 0.025 % EX CREA: TOPICAL_CREAM | CUTANEOUS | 2 refills | Status: AC

## 2021-12-31 MED ORDER — DOXYCYCLINE MONOHYDRATE 50 MG PO CAPS: 50.0000 mg | ORAL_CAPSULE | Freq: Every day | ORAL | 2 refills | Status: AC

## 2021-12-31 NOTE — Patient Instructions (Addendum)
Start tretinioin 0.025 % cream apply pea sized amount nightly to face followed by moisturizer like cerave cream  Start doxycycline 50 mg cap - take 1 capsule by mouth with evening meal.   Doxycycline should be taken with food to prevent nausea. Do not lay down for 30 minutes after taking. Be cautious with sun exposure and use good sun protection while on this medication. Pregnant women should not take this medication.       Topical retinoid medications like tretinoin/Retin-A, adapalene/Differin, tazarotene/Fabior, and Epiduo/Epiduo Forte can cause dryness and irritation when first started. Only apply a pea-sized amount to the entire affected area. Avoid applying it around the eyes, edges of mouth and creases at the nose. If you experience irritation, use a good moisturizer first and/or apply the medicine less often. If you are doing well with the medicine, you can increase how often you use it until you are applying every night. Be careful with sun protection while using this medication as it can make you sensitive to the sun. This medicine should not be used by pregnant women.     Due to recent changes in healthcare laws, you may see results of your pathology and/or laboratory studies on MyChart before the doctors have had a chance to review them. We understand that in some cases there may be results that are confusing or concerning to you. Please understand that not all results are received at the same time and often the doctors may need to interpret multiple results in order to provide you with the best plan of care or course of treatment. Therefore, we ask that you please give [place] 2 business days to thoroughly review all your results before contacting the office for clarification. Should we see a critical lab result, you will be contacted sooner.   If You Need Anything After Your Visit  If you have any questions or concerns for your doctor, please call our main line at [phone] and press  option 4 to reach your doctor's medical assistant. If no one answers, please leave a voicemail as directed and we will return your call as soon as possible. Messages left after 4 pm will be answered the following business day.   You may also send [place] a message via [place]. We typically respond to MyChart messages within 1-2 business days.  For prescription refills, please ask your pharmacy to contact our office. Our fax number is [phone].  If you have an urgent issue when the clinic is closed that cannot wait until the next business day, you can page your doctor at the number below.    Please note that while we do our best to be available for urgent issues outside of office hours, we are not available 24/7.   If you have an urgent issue and are unable to reach [place], you may choose to seek medical care at your doctor's office, retail clinic, urgent care center, or emergency room.  If you have a medical emergency, please immediately call 911 or go to the emergency department.  Pager Numbers  - Dr. Nehemiah Massed: [phone]  - Dr. Laurence Ferrari: [phone]  - Dr. Nicole Kindred: [phone]  In the event of inclement weather, please call our main line at [phone] for an update on the status of any delays or closures.  Dermatology Medication Tips: Please keep the boxes that topical medications come in in order to help keep track of the instructions about where and how to use these. Pharmacies typically print the medication instructions only  on the boxes and not directly on the medication tubes.   If your medication is too expensive, please contact our office at [phone] option 4 or send [place] a message through [place].   We are unable to tell what your co-pay for medications will be in advance as this is different depending on your insurance coverage. However, we may be able to find a substitute medication at lower cost or fill out paperwork to get insurance to cover a needed medication.   If a  prior authorization is required to get your medication covered by your insurance company, please allow [place] 1-2 business days to complete this process.  Drug prices often vary depending on where the prescription is filled and some pharmacies may offer cheaper prices.  The website www.goodrx.com contains coupons for medications through different pharmacies. The prices here do not account for what the cost may be with help from insurance (it may be cheaper with your insurance), but the website can give you the price if you did not use any insurance.  - You can print the associated coupon and take it with your prescription to the pharmacy.  - You may also stop by our office during regular business hours and pick up a GoodRx coupon card.  - If you need your prescription sent electronically to a different pharmacy, notify our office through [facility] or by phone at [phone] option 4.     Si Usted Necesita Algo Despus de Su Visita  Tambin puede enviarnos un mensaje a travs de Pharmacist, community. Por lo general respondemos a los mensajes de MyChart en el transcurso de 1 a 2 das hbiles.  Para renovar recetas, por favor pida a su farmacia que se ponga en contacto con nuestra oficina. Harland Dingwall de fax es [place] [phone].  Si tiene un asunto urgente cuando la clnica est cerrada y que no puede esperar hasta el siguiente da hbil, puede llamar/localizar a su doctor(a) al nmero que aparece a continuacin.   Por favor, tenga en cuenta que aunque hacemos todo lo posible para estar disponibles para asuntos urgentes fuera del horario de [place], no estamos disponibles las 24 horas del da, los 7 das de la [place].   Si tiene un problema urgente y no puede comunicarse con nosotros, puede optar por buscar atencin mdica  en el consultorio de su doctor(a), en una clnica privada, en un centro de atencin urgente o en una sala de emergencias.  Si tiene Engineering geologist, por favor llame  inmediatamente al 911 o vaya a la sala de emergencias.  Nmeros de bper  - Dr. Nehemiah Massed: [phone]  - Dra. Moye: [phone]  - Dra. Nicole Kindred: [phone]  En caso de inclemencias del [place], por favor llame a Johnsie Kindred principal al [phone] para una actualizacin sobre el [place] de cualquier retraso o cierre.  Consejos para la medicacin en dermatologa: Por favor, guarde las cajas en las que vienen los medicamentos de uso tpico para ayudarle a seguir las instrucciones sobre dnde y cmo usarlos. Las farmacias generalmente imprimen las instrucciones del medicamento slo en las cajas y no directamente en los tubos del [place].   Si su medicamento es muy caro, por favor, pngase en contacto con Zigmund Daniel llamando al [phone] y presione la opcin 4 o envenos un mensaje a travs de Pharmacist, community.   No podemos decirle cul ser su copago por los medicamentos por adelantado ya que esto es diferente dependiendo de la cobertura de su seguro. Sin embargo, es posible  que podamos encontrar un medicamento sustituto a Electrical engineer un formulario para que el seguro cubra el medicamento que se considera necesario.   Si se requiere una autorizacin previa para que su compaa de seguros [place] su medicamento, por favor permtanos de 1 a 2 das hbiles para completar este proceso.  Los precios de los medicamentos varan con frecuencia dependiendo del Environmental consultant de dnde se surte la receta y alguna farmacias pueden ofrecer precios ms baratos.  El sitio web www.goodrx.com tiene cupones para medicamentos de Airline pilot. Los precios aqu no tienen en cuenta lo que podra costar con la ayuda del seguro (puede ser ms barato con su seguro), pero el sitio web puede darle el precio si no utiliz Research scientist (physical sciences).  - Puede imprimir el cupn correspondiente y llevarlo con su receta a la farmacia.  - Tambin puede pasar por nuestra oficina durante el horario de atencin regular y Charity fundraiser  una tarjeta de cupones de GoodRx.  - Si necesita que su receta se enve electrnicamente a una farmacia diferente, informe a nuestra oficina a travs de MyChart de [place] o por telfono llamando al [phone] y presione la opcin 4.

## 2021-12-31 NOTE — Progress Notes (Unsigned)
New Patient Visit  Subjective  Kimberly Anderson is a 56 y.o. female who presents for the following: New Patient (Initial Visit) (Patient here today concerning acne and other spots at face.).  The following portions of the chart were reviewed this encounter and updated as appropriate:   Tobacco  Allergies  Meds  Problems  Med Hx  Surg Hx  Fam Hx     Review of Systems:  No other skin or systemic complaints except as noted in HPI or Assessment and Plan.  Objective  Well appearing patient in no apparent distress; mood and affect are within normal limits.  A focused examination was performed including face. Relevant physical exam findings are noted in the Assessment and Plan.  face Comedones and papules at face with scarring and hyperpigmentation          Assessment & Plan  Acne vulgaris Severe, with dyschromia and scarring face Chronic and persistent condition with duration or expected duration over one year. Condition is bothersome/symptomatic for patient. Currently flared.  Start tretinoin 0.025 % cream apply pea sized amount to face nightly followed by moisturizer such as cerave cream.   Start doxycycline 50 mg tab po qd with evening meal.   Doxycycline should be taken with food to prevent nausea. Do not lay down for 30 minutes after taking. Be cautious with sun exposure and use good sun protection while on this medication. Pregnant women should not take this medication.   Topical retinoid medications like tretinoin/Retin-A, adapalene/Differin, tazarotene/Fabior, and Epiduo/Epiduo Forte can cause dryness and irritation when first started. Only apply a pea-sized amount to the entire affected area. Avoid applying it around the eyes, edges of mouth and creases at the nose. If you experience irritation, use a good moisturizer first and/or apply the medicine less often. If you are doing well with the medicine, you can increase how often you use it until you are applying  every night. Be careful with sun protection while using this medication as it can make you sensitive to the sun. This medicine should not be used by pregnant women.    Discussed Isotretinoin Counseling; Review and Contraception Counseling: Reviewed potential side effects of isotretinoin including xerosis, cheilitis, hepatitis, hyperlipidemia, and severe birth defects if taken by a pregnant woman.  Women on isotretinoin must be celibate (not having sex) or required to use at least 2 birth control methods to prevent pregnancy (unless patient is a female of non-child bearing potential).  Females of child-bearing potential must have monthly pregnancy tests while on isotretinoin and report through I-Pledge (FDA monitoring program). Reviewed reports of suicidal ideation in those with a history of depression while taking isotretinoin and reports of diagnosis of inflammatory bowl disease (IBD) while taking isotretinoin as well as the lack of evidence for a causal relationship between isotretinoin, depression and IBD. Patient advised to reach out with any questions or concerns. Patient advised not to share pills or donate blood while on treatment or for one month after completing treatment. All patient's considering Isotretinoin must read and understand and sign Isotretinoin Consent Form and be registered with I-Pledge.  Female patients MUST use two simultaneous methods of family planning. Accutane is Category X for pregnancy, meaning it will cause fetal teratogenic malformations, and pregnancy MUST be avoided while on this drug.  The dose is 0.5-1 mg per kg in two divided doses for 15-20 weeks.  After discussion of these important issues, she indicates complete understanding of all of the above, and does wish to  proceed with accutane therapy.  Patient paperwork completed   Will review labs [facility] and [facility]   Will check other labs at next follow up  tretinoin (RETIN-A) 0.025 % cream - face Apply pea sized  amount to face for acne  doxycycline (MONODOX) 50 MG capsule - face Take 1 capsule (50 mg total) by mouth daily with supper. Take with food and drink  Do not lay down for 30 minutes after taking. Be cautious with sun exposure and use good sun protection while on this medication.  Luteinizing Hormone - face  Follicle Stimulating Hormone - face  Return in about 9 weeks (around 03/04/2022) for acne follow up.  IRuthell Rummage, CMA, am acting as scribe for Sarina Ser, MD. Documentation: I have reviewed the above documentation for accuracy and completeness, and I agree with the above.  Sarina Ser, MD

## 2022-01-01 ENCOUNTER — Encounter: Payer: Self-pay | Admitting: Dermatology

## 2022-02-09 ENCOUNTER — Other Ambulatory Visit: Payer: Self-pay | Admitting: Family

## 2022-02-09 DIAGNOSIS — E785 Hyperlipidemia, unspecified: Secondary | ICD-10-CM

## 2022-03-19 ENCOUNTER — Ambulatory Visit: Payer: Self-pay | Admitting: Dermatology

## 2022-03-25 ENCOUNTER — Ambulatory Visit: Payer: Commercial Managed Care - HMO | Admitting: Neurology

## 2022-04-03 ENCOUNTER — Other Ambulatory Visit: Payer: Self-pay | Admitting: Dermatology

## 2022-04-03 DIAGNOSIS — L700 Acne vulgaris: Secondary | ICD-10-CM

## 2022-07-20 ENCOUNTER — Emergency Department (HOSPITAL_COMMUNITY)
Admission: EM | Admit: 2022-07-20 | Discharge: 2022-07-20 | Disposition: A | Discharge status: Home / Self Care | Payer: BLUE CROSS/BLUE SHIELD | Attending: Emergency Medicine | Admitting: Emergency Medicine

## 2022-07-20 ENCOUNTER — Other Ambulatory Visit: Payer: Self-pay

## 2022-07-20 ENCOUNTER — Emergency Department (HOSPITAL_COMMUNITY): Payer: BLUE CROSS/BLUE SHIELD

## 2022-07-20 ENCOUNTER — Encounter (HOSPITAL_COMMUNITY): Payer: Self-pay

## 2022-07-20 DIAGNOSIS — W540XXA Bitten by dog, initial encounter: Secondary | ICD-10-CM | POA: Insufficient documentation

## 2022-07-20 DIAGNOSIS — E119 Type 2 diabetes mellitus without complications: Secondary | ICD-10-CM | POA: Insufficient documentation

## 2022-07-20 DIAGNOSIS — Z23 Encounter for immunization: Secondary | ICD-10-CM | POA: Insufficient documentation

## 2022-07-20 DIAGNOSIS — I10 Essential (primary) hypertension: Secondary | ICD-10-CM | POA: Diagnosis not present

## 2022-07-20 DIAGNOSIS — E039 Hypothyroidism, unspecified: Secondary | ICD-10-CM | POA: Insufficient documentation

## 2022-07-20 DIAGNOSIS — S61012A Laceration without foreign body of left thumb without damage to nail, initial encounter: Secondary | ICD-10-CM | POA: Insufficient documentation

## 2022-07-20 DIAGNOSIS — S61411A Laceration without foreign body of right hand, initial encounter: Secondary | ICD-10-CM | POA: Diagnosis not present

## 2022-07-20 MED ORDER — BUPIVACAINE HCL (PF) 0.25 % IJ SOLN
20.0000 mL | Freq: Once | INTRAMUSCULAR | Status: AC
  Administered 2022-07-20: 20 mL
  Filled 2022-07-20: qty 30

## 2022-07-20 MED ORDER — TETANUS-DIPHTH-ACELL PERTUSSIS 5-2.5-18.5 LF-MCG/0.5 IM SUSY
0.5000 mL | PREFILLED_SYRINGE | Freq: Once | INTRAMUSCULAR | Status: AC
  Administered 2022-07-20: 0.5 mL via INTRAMUSCULAR
  Filled 2022-07-20: qty 0.5

## 2022-07-20 MED ORDER — BUPIVACAINE-EPINEPHRINE (PF) 0.25% -1:200000 IJ SOLN: 10.0000 mL | Freq: Once | INTRAMUSCULAR | Status: DC

## 2022-07-20 MED ORDER — AMOXICILLIN-POT CLAVULANATE 875-125 MG PO TABS
1.0000 | ORAL_TABLET | Freq: Once | ORAL | Status: AC
  Administered 2022-07-20: 1 via ORAL
  Filled 2022-07-20: qty 1

## 2022-07-20 MED ORDER — IBUPROFEN 400 MG PO TABS
600.0000 mg | ORAL_TABLET | Freq: Once | ORAL | Status: AC
  Administered 2022-07-20: 600 mg via ORAL
  Filled 2022-07-20: qty 1

## 2022-07-20 MED ORDER — AMOXICILLIN-POT CLAVULANATE 875-125 MG PO TABS: 1.0000 | ORAL_TABLET | Freq: Two times a day (BID) | ORAL | 0 refills | Status: AC

## 2022-07-20 NOTE — Discharge Instructions (Addendum)
Take antibiotic as direct. Look for progressing signs of infection. Sutures will need to be removed in 7  days by primary care, urgent care of family medicine. Wash area gently with warm soapy water. Take tylenol/motrin for pain. Please do not hesitate to return to the ED if the worrisome signs and symptoms we discussed become apparent.

## 2022-07-20 NOTE — ED Provider Notes (Signed)
[place] EMERGENCY DEPARTMENT AT [facility] Provider Note   CSN: [id number] Arrival date & time: 07/20/22  1304     History  No chief complaint on file.   Kimberly Anderson is a 57 y.o. female.  HPI   57 year old female presents emergency department with complaints of dog bite.  Patient states that she was attempting to break up her dogs fighting at home and was bit on her left thumb as well as her right hand and some of her right fingers.  Denies any weakness or sensory deficits.  States that last tetanus was greater than 10 years ago and is requesting update.  States that pets are up-to-date on rabies vaccines and is not concerned about contraction of rabies.  Denies trauma elsewhere.  Past medical history significant for diabetes mellitus, hypertension, hypothyroidism, drug abuse,  Home Medications Prior to Admission medications   Medication Sig Start Date End Date Taking? Authorizing Provider  amoxicillin-clavulanate (AUGMENTIN) 875-125 MG tablet Take 1 tablet by mouth every 12 (twelve) hours. 07/20/22  Yes Sherian Maroon A, PA  acetaminophen (TYLENOL) 500 MG tablet TAKE 2 TABLETS (1,000 MG TOTAL) BY MOUTH EVERY 8 (EIGHT) HOURS AS NEEDED FOR MILD PAIN.    [provider]  cetirizine (ZYRTEC ALLERGY) 10 MG tablet Take 1 tablet (10 mg total) by mouth at bedtime. 05/25/21 12/31/21  Theadora Rama Scales, PA-C  doxycycline (MONODOX) 50 MG capsule Take 1 capsule (50 mg total) by mouth daily with supper. Take with food and drink  Do not lay down for 30 minutes after taking. Be cautious with sun exposure and use good sun protection while on this medication. 12/31/21   Deirdre Evener, MD  Dulaglutide (TRULICITY) 0.75 MG/0.5ML SOPN USE 0.75MG  SUBCUTANEOUS INJECTION ONCE WEEKLY    [provider]  ergocalciferol (VITAMIN D2) 1.25 MG (50000 UT) capsule ergocalciferol (vitamin D2) 1,250 mcg (50,000 unit) capsule    [provider]   hydrochlorothiazide (HYDRODIURIL) 50 MG tablet Take 0.5 tablets (25 mg total) by mouth daily. 12/25/20 12/31/21  Rema Fendt, NP  metoCLOPramide (REGLAN) 10 MG tablet Take 1 tablet (10 mg total) by mouth every 6 (six) hours. 07/15/21   Gailen Shelter, PA  Multiple Vitamin (MULTIVITAMIN) capsule Take 1 capsule by mouth daily.    [provider]  tretinoin (RETIN-A) 0.025 % cream Apply pea sized amount to face for acne 12/31/21   Deirdre Evener, MD      Allergies    Patient has no known allergies.    Review of Systems   Review of Systems  All other systems reviewed and are negative.   Physical Exam Updated Vital Signs BP (!) 144/101 (BP Location: Left Arm)   Pulse 97   Temp 98.7 F (37.1 C) (Oral)   Resp 17   LMP 03/04/2011   SpO2 100%  Physical Exam Vitals and nursing note reviewed.  Constitutional:      General: She is not in acute distress.    Appearance: She is well-developed.  HENT:     Head: Normocephalic and atraumatic.  Eyes:     Conjunctiva/sclera: Conjunctivae normal.  Cardiovascular:     Rate and Rhythm: Normal rate and regular rhythm.     Heart sounds: No murmur heard. Pulmonary:     Effort: Pulmonary effort is normal. No respiratory distress.     Breath sounds: Normal breath sounds.  Abdominal:     Palpations: Abdomen is soft.     Tenderness: There is no  abdominal tenderness.  Musculoskeletal:        General: No swelling.     Cervical back: Neck supple.     Comments: Patient with full range of motion of digits of bilateral hands as well as wrist.  Small 1 cm linear laceration appreciated on the palmar aspect of the left thumb just proximal to the interphalangeal joint.  No active bleeding.  No foreign body.  Right hand has a small puncture wounds and webspace in between first and second digit with small 1-1.5 linear laceration noted on the dorsal aspect of thenar eminence.  No active bleeding or foreign body.  Skin:    General: Skin is warm  and dry.     Capillary Refill: Capillary refill takes less than 2 seconds.  Neurological:     Mental Status: She is alert.  Psychiatric:        Mood and Affect: Mood normal.     ED Results / Procedures / Treatments   Labs (all labs ordered are listed, but only abnormal results are displayed) Labs Reviewed - No data to display  EKG None  Radiology DG Hand Complete Right  Result Date: 07/20/2022 CLINICAL DATA:  pain EXAM: RIGHT HAND - COMPLETE 3+ VIEW COMPARISON:  None available FINDINGS: Narrowing of the articular cartilage in the first MCP joint without significant spurring or subchondral sclerosis. Normal alignment. No other significant osseous degenerative change. Regional soft tissues unremarkable. IMPRESSION: Mild first MCP DJD. No acute findings. Electronically Signed   By: Corlis Leak M.D.   On: 07/20/2022 15:18   DG Hand Complete Left  Result Date: 07/20/2022 CLINICAL DATA:  pain EXAM: LEFT HAND - COMPLETE 3+ VIEW COMPARISON:  None Available. FINDINGS: There is no evidence of fracture or dislocation. There is no evidence of arthropathy or other focal bone abnormality. Soft tissues are unremarkable. IMPRESSION: Negative. Electronically Signed   By: Corlis Leak M.D.   On: 07/20/2022 15:14    Procedures .Marland KitchenLaceration Repair  Date/Time: 07/20/2022 4:14 PM  Performed by: Peter Garter, PA Authorized by: Peter Garter, PA   Consent:    Consent obtained:  Verbal   Consent given by:  Patient   Risks, benefits, and alternatives were discussed: yes     Risks discussed:  Infection, need for additional repair, nerve damage, poor wound healing, poor cosmetic result, pain, retained foreign body, tendon damage and vascular damage   Alternatives discussed:  Delayed treatment, observation and referral Universal protocol:    Procedure explained and questions answered to patient or proxy's satisfaction: yes     Patient identity confirmed:  Verbally with patient Anesthesia:     Anesthesia method:  Local infiltration   Local anesthetic:  Bupivacaine 0.25% w/o epi Laceration details:    Location:  Finger   Finger location:  L thumb   Length (cm):  1.5 Pre-procedure details:    Preparation:  Imaging obtained to evaluate for foreign bodies and patient was prepped and draped in usual sterile fashion Exploration:    Limited defect created (wound extended): no     Hemostasis achieved with:  Direct pressure   Imaging obtained: x-ray     Imaging outcome: foreign body not noted     Wound exploration: wound explored through full range of motion and entire depth of wound visualized     Contaminated: no   Treatment:    Area cleansed with:  Saline and povidone-iodine   Amount of cleaning:  Standard   Irrigation solution:  Sterile saline  Irrigation volume:  150cc   Irrigation method:  Syringe   Visualized foreign bodies/material removed: no     Undermining:  None   Scar revision: no   Skin repair:    Repair method:  Sutures   Suture size:  4-0   Suture material:  Prolene   Suture technique:  Simple interrupted   Number of sutures:  1 Approximation:    Approximation:  Loose Repair type:    Repair type:  Simple Post-procedure details:    Dressing:  Open (no dressing)   Procedure completion:  Tolerated well, no immediate complications .Marland KitchenLaceration Repair  Date/Time: 07/20/2022 4:15 PM  Performed by: Peter Garter, PA Authorized by: Peter Garter, PA   Consent:    Consent obtained:  Verbal   Risks, benefits, and alternatives were discussed: yes     Risks discussed:  Infection, poor wound healing, poor cosmetic result, need for additional repair, nerve damage, pain, retained foreign body, tendon damage and vascular damage   Alternatives discussed:  No treatment, delayed treatment, observation and referral Universal protocol:    Procedure explained and questions answered to patient or proxy's satisfaction: yes     Patient identity confirmed:  Verbally  with patient Anesthesia:    Anesthesia method:  Local infiltration   Local anesthetic:  Bupivacaine 0.25% w/o epi Laceration details:    Location:  Hand   Hand location:  R hand, dorsum   Length (cm):  1.5 Pre-procedure details:    Preparation:  Patient was prepped and draped in usual sterile fashion and imaging obtained to evaluate for foreign bodies Exploration:    Limited defect created (wound extended): no     Hemostasis achieved with:  Direct pressure   Imaging obtained: x-ray     Imaging outcome: foreign body not noted     Contaminated: no   Treatment:    Area cleansed with:  Saline and povidone-iodine   Irrigation solution:  Sterile saline   Irrigation volume:  150cc   Irrigation method:  Syringe   Visualized foreign bodies/material removed: no     Debridement:  None   Undermining:  None   Scar revision: no   Skin repair:    Repair method:  Sutures   Suture size:  4-0   Suture material:  Prolene   Suture technique:  Simple interrupted   Number of sutures:  1 Approximation:    Approximation:  Loose Repair type:    Repair type:  Simple Post-procedure details:    Dressing:  Open (no dressing)   Procedure completion:  Tolerated well, no immediate complications     Medications Ordered in ED Medications  Tdap (BOOSTRIX) injection 0.5 mL (has no administration in time range)  bupivacaine (PF) (MARCAINE) 0.25 % injection 20 mL (has no administration in time range)  amoxicillin-clavulanate (AUGMENTIN) 875-125 MG per tablet 1 tablet (has no administration in time range)  ibuprofen (ADVIL) tablet 600 mg (has no administration in time range)    ED Course/ Medical Decision Making/ A&P                             Medical Decision Making Amount and/or Complexity of Data Reviewed Radiology: ordered.  Risk Prescription drug management.   This patient presents to the ED for concern of dog bite, laceration, this involves an extensive number of treatment options, and  is a complaint that carries with it a high risk of complications and morbidity.  The  differential diagnosis includes cellulitis, erysipelas, foreign body, fracture, dislocation   Co morbidities that complicate the patient evaluation  See HPI   Additional history obtained:  Additional history obtained from EMR External records from outside source obtained and reviewed including hospital records   Lab Tests:  N/a   Imaging Studies ordered:  I ordered imaging studies including left and right hand x-ray I independently visualized and interpreted imaging which showed  Left hand x-ray: No acute abnormalities Right hand x-ray: Mild first degenerative changes of MCP joint.  No acute abnormalities I agree with the radiologist interpretation   Cardiac Monitoring: / EKG:  The patient was maintained on a cardiac monitor.  I personally viewed and interpreted the cardiac monitored which showed an underlying rhythm of: Sinus rhythm   Consultations Obtained:  N/a   Problem List / ED Course / Critical interventions / Medication management  Dog bite, laceration I ordered medication including Motrin, Augmentin, Marcaine, Tdap   Reevaluation of the patient after these medicines showed that the patient improved I have reviewed the patients home medicines and have made adjustments as needed   Social Determinants of Health:  Tobacco, licit drug use   Test / Admission - Considered:  Dog bite/laceration Vitals signs significant for hypertension with blood pressure 144/101. Otherwise within normal range and stable throughout visit. Laboratory/imaging studies significant for: See above 57 year old female presents after a dog bite.  Tetanus updated while in the emergency department.  Patient with 1.5 gaping lacerations noted on left thumb as well as dorsum of right hand with loose repair with sutures.  No evidence of weakness, ligamentous/tendinous involvement or neurovascular compromise.   Will place patient on antibiotics empirically in the form of Augmentin.  Recommend follow-up for suture removal within 7 to 10 days.  Treatment plan discussed at length with patient and she acknowledged understanding was agreeable to said plan. Worrisome signs and symptoms were discussed with the patient, and the patient acknowledged understanding to return to the ED if noticed. Patient was stable upon discharge.          Final Clinical Impression(s) / ED Diagnoses Final diagnoses:  Dog bite, initial encounter    Rx / DC Orders ED Discharge Orders          Ordered    amoxicillin-clavulanate (AUGMENTIN) 875-125 MG tablet  Every 12 hours        07/20/22 1549              Peter Garter, [place] 07/20/22 1618    Wynetta Fines, MD 07/20/22 2211

## 2022-07-20 NOTE — ED Triage Notes (Signed)
Patient here with dogbite to right hand and left thumb. Patient was trying to break up fight and bitten. The dog that bit her is not up to date on rabies. Wound cleaned and redressed in triage. Minimal bleeding

## 2022-08-02 ENCOUNTER — Other Ambulatory Visit: Payer: Self-pay | Admitting: Dermatology

## 2022-08-02 DIAGNOSIS — L700 Acne vulgaris: Secondary | ICD-10-CM

## 2022-08-21 ENCOUNTER — Encounter (HOSPITAL_BASED_OUTPATIENT_CLINIC_OR_DEPARTMENT_OTHER): Payer: Self-pay

## 2022-08-21 ENCOUNTER — Emergency Department (HOSPITAL_BASED_OUTPATIENT_CLINIC_OR_DEPARTMENT_OTHER)
Admission: EM | Admit: 2022-08-21 | Discharge: 2022-08-21 | Disposition: A | Discharge status: Home / Self Care | Payer: BLUE CROSS/BLUE SHIELD | Attending: Emergency Medicine | Admitting: Emergency Medicine

## 2022-08-21 ENCOUNTER — Other Ambulatory Visit: Payer: Self-pay

## 2022-08-21 DIAGNOSIS — Z79899 Other long term (current) drug therapy: Secondary | ICD-10-CM | POA: Diagnosis not present

## 2022-08-21 DIAGNOSIS — I10 Essential (primary) hypertension: Secondary | ICD-10-CM | POA: Insufficient documentation

## 2022-08-21 DIAGNOSIS — K047 Periapical abscess without sinus: Secondary | ICD-10-CM | POA: Insufficient documentation

## 2022-08-21 DIAGNOSIS — Z7984 Long term (current) use of oral hypoglycemic drugs: Secondary | ICD-10-CM | POA: Insufficient documentation

## 2022-08-21 DIAGNOSIS — E119 Type 2 diabetes mellitus without complications: Secondary | ICD-10-CM | POA: Diagnosis not present

## 2022-08-21 MED ORDER — FLUCONAZOLE 150 MG PO TABS: 150.0000 mg | ORAL_TABLET | Freq: Every day | ORAL | 1 refills | Status: AC

## 2022-08-21 MED ORDER — LIDOCAINE VISCOUS HCL 2 % MT SOLN
15.0000 mL | Freq: Once | OROMUCOSAL | Status: AC
  Administered 2022-08-21: 15 mL via OROMUCOSAL
  Filled 2022-08-21: qty 15

## 2022-08-21 MED ORDER — CLINDAMYCIN HCL 150 MG PO CAPS: 300.0000 mg | ORAL_CAPSULE | Freq: Three times a day (TID) | ORAL | 0 refills | Status: AC

## 2022-08-21 MED ORDER — CHLORHEXIDINE GLUCONATE 0.12 % MT SOLN: 15.0000 mL | Freq: Once | OROMUCOSAL | Status: DC

## 2022-08-21 MED ORDER — CHLORHEXIDINE GLUCONATE 0.12 % MT SOLN: 15.0000 mL | Freq: Two times a day (BID) | OROMUCOSAL | 0 refills | Status: AC

## 2022-08-21 MED ORDER — OXYCODONE HCL 5 MG PO TABS: 2.5000 mg | ORAL_TABLET | Freq: Four times a day (QID) | ORAL | 0 refills | Status: AC | PRN

## 2022-08-21 MED ORDER — BUPIVACAINE-EPINEPHRINE (PF) 0.5% -1:200000 IJ SOLN
1.8000 mL | Freq: Once | INTRAMUSCULAR | Status: AC
  Administered 2022-08-21: 1.8 mL

## 2022-08-21 NOTE — ED Provider Notes (Signed)
[place] EMERGENCY DEPARTMENT AT [facility] Provider Note   CSN: [id number] Arrival date & time: 08/21/22  1220     History  Chief Complaint  Patient presents with   Abscess    Kimberly Anderson is a 57 y.o. female with a past medical history of hypertension, diabetes who presents emergency department with chief complaint of dental abscess.  Patient has a broken upper premolar.  Patient reports 2 weeks of worsening abscess on the left upper side of her mouth.  She went her to her dentist 2 weeks ago who put her on amoxicillin and tramadol.  She had progressively worsening pain and swelling of the medial left upper hard palate.  She was seen at an urgent care and placed on clindamycin.  She continues to have worsening swelling of the hard palate and presented here today due to this.  She has a throbbing headache rates her pain as 10 out of 10 denies any pain with swallowing, throat swelling or change in voice.   Abscess      Home Medications Prior to Admission medications   Medication Sig Start Date End Date Taking? Authorizing Provider  amitriptyline (ELAVIL) 25 MG tablet Take 25 mg by mouth at bedtime. 07/24/22 07/24/23 Yes [provider]  amLODipine (NORVASC) 2.5 MG tablet Take 2.5 mg by mouth daily. 07/22/22  Yes [provider]  chlorhexidine (PERIDEX) 0.12 % solution Use as directed 15 mLs in the mouth or throat 2 (two) times daily. 08/21/22  Yes Azelie Noguera, PA-C  clindamycin (CLEOCIN) 150 MG capsule Take 2 capsules (300 mg total) by mouth 3 (three) times daily for 7 days. May dispense as 150mg  capsules 08/21/22 08/28/22 Yes Antwann Preziosi, PA-C  fluconazole (DIFLUCAN) 150 MG tablet Take 1 tablet (150 mg total) by mouth daily. 08/21/22  Yes Jameika Kinn, PA-C  oxyCODONE (ROXICODONE) 5 MG immediate release tablet Take 0.5-1 tablets (2.5-5 mg total) by mouth every 6 (six) hours as needed for severe pain. 08/21/22  Yes Carlye Panameno, PA-C   tiZANidine (ZANAFLEX) 2 MG tablet Take 2 mg by mouth at bedtime. 03/27/22  Yes [provider]  acetaminophen (TYLENOL) 500 MG tablet TAKE 2 TABLETS (1,000 MG TOTAL) BY MOUTH EVERY 8 (EIGHT) HOURS AS NEEDED FOR MILD PAIN.    [provider]  amoxicillin-clavulanate (AUGMENTIN) 875-125 MG tablet Take 1 tablet by mouth every 12 (twelve) hours. 07/20/22   Peter Garter, PA  cetirizine (ZYRTEC ALLERGY) 10 MG tablet Take 1 tablet (10 mg total) by mouth at bedtime. 05/25/21 12/31/21  Theadora Rama Scales, PA-C  doxycycline (MONODOX) 50 MG capsule Take 1 capsule (50 mg total) by mouth daily with supper. Take with food and drink  Do not lay down for 30 minutes after taking. Be cautious with sun exposure and use good sun protection while on this medication. 12/31/21   Deirdre Evener, MD  Dulaglutide (TRULICITY) 0.75 MG/0.5ML SOPN USE 0.75MG  SUBCUTANEOUS INJECTION ONCE WEEKLY    [provider]  ergocalciferol (VITAMIN D2) 1.25 MG (50000 UT) capsule ergocalciferol (vitamin D2) 1,250 mcg (50,000 unit) capsule    [provider]  hydrochlorothiazide (HYDRODIURIL) 50 MG tablet Take 0.5 tablets (25 mg total) by mouth daily. 12/25/20 12/31/21  Rema Fendt, NP  metFORMIN (GLUCOPHAGE) 500 MG tablet Take 500 mg by mouth 2 (two) times daily.    [provider]  metoCLOPramide (REGLAN) 10 MG tablet Take 1 tablet (10 mg total) by mouth every 6 (six) hours. 07/15/21   Fondaw,  Rodrigo Ran, PA  Multiple Vitamin (MULTIVITAMIN) capsule Take 1 capsule by mouth daily.    [provider]  tretinoin (RETIN-A) 0.025 % cream Apply pea sized amount to face for acne 12/31/21   Deirdre Evener, MD      Allergies    Patient has no known allergies.    Review of Systems   Review of Systems  Physical Exam Updated Vital Signs BP (!) 165/103   Pulse 77   Temp 98.5 F (36.9 C) (Oral)   Resp 18   Ht 5\' 9"  (1.753 m)   Wt 64 kg   LMP 03/04/2011   SpO2 98%   BMI 20.82  kg/m  Physical Exam Vitals and nursing note reviewed.  Constitutional:      General: She is not in acute distress.    Appearance: She is well-developed. She is not diaphoretic.  HENT:     Head: Normocephalic and atraumatic.     Right Ear: External ear normal.     Left Ear: External ear normal.     Nose: Nose normal.     Mouth/Throat:     Mouth: Mucous membranes are moist.     Dentition: Dental abscesses present.     Comments: Poor dentition, multiple dental caries, patient's second premolar on the upper left side is broken down to the gumline with obvious dental decay.  She has an associated abscess of the hard palate that does not past the midline or extend into the soft palate on the left upper side.  It is fluctuant and exquisitely tender to palpation. Eyes:     General: No scleral icterus.    Conjunctiva/sclera: Conjunctivae normal.  Cardiovascular:     Rate and Rhythm: Normal rate and regular rhythm.     Heart sounds: Normal heart sounds. No murmur heard.    No friction rub. No gallop.  Pulmonary:     Effort: Pulmonary effort is normal. No respiratory distress.     Breath sounds: Normal breath sounds.  Abdominal:     General: Bowel sounds are normal. There is no distension.     Palpations: Abdomen is soft. There is no mass.     Tenderness: There is no abdominal tenderness. There is no guarding.  Musculoskeletal:     Cervical back: Normal range of motion.  Skin:    General: Skin is warm and dry.  Neurological:     Mental Status: She is alert and oriented to person, place, and time.  Psychiatric:        Behavior: Behavior normal.     ED Results / Procedures / Treatments   Labs (all labs ordered are listed, but only abnormal results are displayed) Labs Reviewed - No data to display  EKG None  Radiology No results found.  Procedures Dental Block  Date/Time: 08/21/2022 4:10 PM  Performed by: Arthor Captain, PA-C Authorized by: Arthor Captain, PA-C    Universal protocol:    Patient identity confirmed:  Verbally with patient Indications:    Indications: dental abscess   Procedure details:    Needle gauge:  27 G   Anesthetic injected:  Bupivacaine 0.5% WITH epi (greater palatine nerve block)   Injection procedure:  Anatomic landmarks identified Post-procedure details:    Outcome:  Pain relieved   Procedure completion:  Tolerated well, no immediate complications .Marland KitchenIncision and Drainage  Date/Time: 08/21/2022 4:11 PM  Performed by: Arthor Captain, PA-C Authorized by: Arthor Captain, PA-C   Consent:    Consent obtained:  Verbal  Consent given by:  Patient   Risks discussed:  Bleeding, incomplete drainage and pain   Alternatives discussed:  No treatment Universal protocol:    Patient identity confirmed:  Arm band Location:    Type:  Abscess   Size:  6cm   Location: Left Hard palate. Anesthesia:    Anesthesia method:  Nerve block (see dental block porcedure note) Procedure type:    Complexity:  Simple Procedure details:    Incision types:  Single straight   Incision depth:  Dermal   Wound management:  Irrigated with saline and extensive cleaning   Drainage:  Purulent   Drainage amount:  Copious   Wound treatment:  Wound left open Post-procedure details:    Procedure completion:  Tolerated well, no immediate complications     Medications Ordered in ED Medications  bupivacaine-epinephrine (PF) (MARCAINE W/ EPI) 0.5% -1:200000 injection 1.8 mL (1.8 mLs Infiltration Given 08/21/22 1530)  lidocaine (XYLOCAINE) 2 % viscous mouth solution 15 mL (15 mLs Mouth/Throat Given 08/21/22 1530)    ED Course/ Medical Decision Making/ A&P                             Medical Decision Making  Patient with Large dental abscess. Comorbidities include diabetes, poor dentition. Patient tolerated incision and drainage of the hard palate well after greater palatine nerve block and local anesthesia.  She had copious discharge. No  evidence of deep space infection or ludwig's  PDMP reviewed during this encounter.  Dc on clinda/peridex,and soft diet.        Final Clinical Impression(s) / ED Diagnoses Final diagnoses:  Dental abscess    Rx / DC Orders ED Discharge Orders          Ordered    clindamycin (CLEOCIN) 150 MG capsule  3 times daily        08/21/22 1600    chlorhexidine (PERIDEX) 0.12 % solution  2 times daily        08/21/22 1600    oxyCODONE (ROXICODONE) 5 MG immediate release tablet  Every 6 hours PRN        08/21/22 1600    fluconazole (DIFLUCAN) 150 MG tablet  Daily        08/21/22 1600              Arthor Captain, PA-C 08/21/22 1618    Derwood Kaplan, MD 08/21/22 1712

## 2022-08-21 NOTE — ED Notes (Signed)
Patient verbalizes understanding of discharge instructions. Opportunity for questioning and answers were provided. Patient discharged from ED.  °

## 2022-08-21 NOTE — ED Triage Notes (Signed)
Patient here POV from Home.  Endorses Left Upper Infection/Abscess to Mouth that has been present for 3 Weeks. No Known Drainage. No Known Fevers. Not very responsive to multiple antibiotic therapies.   NAD Noted during Triage. A&Ox4. GCS 15. Ambulatory.

## 2022-08-21 NOTE — Discharge Instructions (Signed)
Contact a health care provider if: Your pain is worse and is not helped by medicine. You have swelling. You see pus around the tooth. You have a fever or chills. Get help right away if: Your symptoms suddenly get worse. You have a very bad headache. You have problems breathing or swallowing. You have trouble opening your mouth. You have swelling in your neck or around your eye. These symptoms may represent a serious problem that is an emergency. Do not wait to see if the symptoms will go away. Get medical help right away. Call your local emergency services (911 in the [place] Do not drive yourself to the hospital. IMPORTANT INFORMATION ABOUT YOUR OPIOID PRESCRIPTION    What are Opioids? How Can I Safely Take Opioids? Where Can I Get Help?  Opioids are narcotic pain medications used for moderate-to-severe pain, often after surgery or injury. Evidence shows there may be better and safer options for long term pain from back pain, fibromyalgia, and nerve pain other than opioids. Do not take more tablets or capsules than prescribed by your doctor or more often than prescribed. Do not crush long-acting tablets. Do not share or sell your prescription. Store medication in a safe, secure, dry location away from pets, children, family, and visitors. Naloxone, a medication that can reverse opioid-induced oversedation, can be purchased at some pharmacies and kept on hand for emergency use. IF YOU NEED HELP for substance abuse problems, talk with your primary care provider and contact a local resource: [facility] [phone] [facility] [phone] C[facility] ([facility] in Response to an Epidemic)     [facility] [phone] Orlean Bradford [facility] Solution to the Opioid Problem)  [phone] [facility] [phone] Narcotics Anonymous [email or web address]  [facility] [phone] [facility]  [phone] [facility] National Helpline 1-800-662-HELP or visit https://findtreatment.[facility].pl to find a treatment center near you [facility] [phone] [facility] [phone] Updated 09/23/2017  What are the Major  Risks of Opioid Use?    Opioid use can have serious risk of addiction and overdose. Overdose is often seen as slow breathing and can cause sudden death. Other risks include tolerance (needing more for the same relief) and dependence (withdrawal when stopped).     How Do I Get Rid of Unused Opioids?    Safely dispose of all opioids as soon as you no longer need them. To dispose at home, mix the drug with an undesirable substance like coffee grounds or cat litter then throw them away. Opioids can be dropped off at local sheriff's offices or police departments. If unable to safely dispose of them as above, opioids may be flushed.   What are Some Ways to Help Decrease Major Risk for Overdose?    Avoid alcohol, other opioids, sedating antihistamines (Benadryl, etc.) drugs for sleep/anxiety (Xanax, Ativan, Valium, Ambien, etc.), and muscle relaxants (Flexeril, Soma, Skelaxin, etc.).

## 2022-09-20 ENCOUNTER — Other Ambulatory Visit: Payer: Self-pay | Admitting: Dermatology

## 2022-09-20 DIAGNOSIS — L700 Acne vulgaris: Secondary | ICD-10-CM

## 2023-05-13 DIAGNOSIS — H6693 Otitis media, unspecified, bilateral: Secondary | ICD-10-CM | POA: Diagnosis not present

## 2023-05-13 DIAGNOSIS — K219 Gastro-esophageal reflux disease without esophagitis: Secondary | ICD-10-CM | POA: Diagnosis not present

## 2023-05-13 DIAGNOSIS — R051 Acute cough: Secondary | ICD-10-CM | POA: Diagnosis not present

## 2023-05-18 ENCOUNTER — Encounter (HOSPITAL_COMMUNITY): Payer: Self-pay | Admitting: Emergency Medicine

## 2023-05-18 ENCOUNTER — Emergency Department (HOSPITAL_COMMUNITY)
Admission: EM | Admit: 2023-05-18 | Discharge: 2023-05-18 | Disposition: A | Discharge status: Home / Self Care | Attending: Emergency Medicine | Admitting: Emergency Medicine

## 2023-05-18 ENCOUNTER — Emergency Department (HOSPITAL_COMMUNITY)

## 2023-05-18 DIAGNOSIS — Z043 Encounter for examination and observation following other accident: Secondary | ICD-10-CM | POA: Diagnosis not present

## 2023-05-18 DIAGNOSIS — M25512 Pain in left shoulder: Secondary | ICD-10-CM | POA: Insufficient documentation

## 2023-05-18 DIAGNOSIS — W19XXXA Unspecified fall, initial encounter: Secondary | ICD-10-CM

## 2023-05-18 DIAGNOSIS — W010XXA Fall on same level from slipping, tripping and stumbling without subsequent striking against object, initial encounter: Secondary | ICD-10-CM | POA: Diagnosis not present

## 2023-05-18 DIAGNOSIS — M545 Low back pain, unspecified: Secondary | ICD-10-CM | POA: Insufficient documentation

## 2023-05-18 DIAGNOSIS — Y92512 Supermarket, store or market as the place of occurrence of the external cause: Secondary | ICD-10-CM | POA: Insufficient documentation

## 2023-05-18 MED ORDER — OXYCODONE HCL 5 MG PO TABS: 5.0000 mg | ORAL_TABLET | Freq: Three times a day (TID) | ORAL | 0 refills | Status: AC | PRN

## 2023-05-18 MED ORDER — IBUPROFEN 800 MG PO TABS
800.0000 mg | ORAL_TABLET | Freq: Once | ORAL | Status: AC
  Administered 2023-05-18: 800 mg via ORAL
  Filled 2023-05-18: qty 1

## 2023-05-18 MED ORDER — METHOCARBAMOL 500 MG PO TABS: 500.0000 mg | ORAL_TABLET | Freq: Two times a day (BID) | ORAL | 0 refills | Status: AC | PRN

## 2023-05-18 NOTE — ED Triage Notes (Signed)
 Pt here from home with c/o fall walking into a store yesterday , c/o left shoulder and right wrist pain today

## 2023-05-18 NOTE — Discharge Instructions (Signed)
 Please do not drive or perform dangerous activities after taking oxycodone which is an opioid narcotic.  Do not mix this with alcohol.  Please avoid taking this at the same time as muscle relaxer such as Robaxin, or any other sedative medication.

## 2023-05-18 NOTE — ED Provider Notes (Signed)
 [place] EMERGENCY DEPARTMENT AT [facility] Provider Note   CSN: [id number] Arrival date & time: 05/18/23  1601     History  Chief Complaint  Patient presents with   Marletta Lor    Kimberly Anderson is a 58 y.o. female presented to ED with a mechanical fall yesterday tripping on the carpet landing on her left side.  She says she landed on her left shoulder and her left hip.  She was able to walk since then but she is very sore.  She has a history of arthritis in all of her joints.  She has had a right shoulder replacement.  She takes Robaxin and ibuprofen in her milligrams at home with no relief.  HPI     Home Medications Prior to Admission medications   Medication Sig Start Date End Date Taking? Authorizing Provider  methocarbamol (ROBAXIN) 500 MG tablet Take 1 tablet (500 mg total) by mouth 2 (two) times daily as needed for up to 20 days for muscle spasms. 05/18/23 06/07/23 Yes Kinberly Perris, Kermit Balo, MD  oxyCODONE (ROXICODONE) 5 MG immediate release tablet Take 1 tablet (5 mg total) by mouth every 8 (eight) hours as needed for up to 10 doses for severe pain (pain score 7-10). 05/18/23  Yes Daviana Haymaker, Kermit Balo, MD  acetaminophen (TYLENOL) 500 MG tablet TAKE 2 TABLETS (1,000 MG TOTAL) BY MOUTH EVERY 8 (EIGHT) HOURS AS NEEDED FOR MILD PAIN.    [provider]  amitriptyline (ELAVIL) 25 MG tablet Take 25 mg by mouth at bedtime. 07/24/22 07/24/23  [provider]  amLODipine (NORVASC) 2.5 MG tablet Take 2.5 mg by mouth daily. 07/22/22   [provider]  amoxicillin-clavulanate (AUGMENTIN) 875-125 MG tablet Take 1 tablet by mouth every 12 (twelve) hours. 07/20/22   Peter Garter, PA  cetirizine (ZYRTEC ALLERGY) 10 MG tablet Take 1 tablet (10 mg total) by mouth at bedtime. 05/25/21 12/31/21  Theadora Rama Scales, PA-C  chlorhexidine (PERIDEX) 0.12 % solution Use as directed 15 mLs in the mouth or throat 2 (two) times daily. 08/21/22   Arthor Captain, PA-C   doxycycline (MONODOX) 50 MG capsule Take 1 capsule (50 mg total) by mouth daily with supper. Take with food and drink  Do not lay down for 30 minutes after taking. Be cautious with sun exposure and use good sun protection while on this medication. 12/31/21   Deirdre Evener, MD  Dulaglutide (TRULICITY) 0.75 MG/0.5ML SOPN USE 0.75MG  SUBCUTANEOUS INJECTION ONCE WEEKLY    [provider]  ergocalciferol (VITAMIN D2) 1.25 MG (50000 UT) capsule ergocalciferol (vitamin D2) 1,250 mcg (50,000 unit) capsule    [provider]  fluconazole (DIFLUCAN) 150 MG tablet Take 1 tablet (150 mg total) by mouth daily. 08/21/22   Arthor Captain, PA-C  hydrochlorothiazide (HYDRODIURIL) 50 MG tablet Take 0.5 tablets (25 mg total) by mouth daily. 12/25/20 12/31/21  Rema Fendt, NP  metFORMIN (GLUCOPHAGE) 500 MG tablet Take 500 mg by mouth 2 (two) times daily.    [provider]  metoCLOPramide (REGLAN) 10 MG tablet Take 1 tablet (10 mg total) by mouth every 6 (six) hours. 07/15/21   Gailen Shelter, PA  Multiple Vitamin (MULTIVITAMIN) capsule Take 1 capsule by mouth daily.    [provider]  oxyCODONE (ROXICODONE) 5 MG immediate release tablet Take 0.5-1 tablets (2.5-5 mg total) by mouth every 6 (six) hours as needed for severe pain. 08/21/22   Harris, Cammy Copa, PA-C  tiZANidine (ZANAFLEX) 2 MG tablet Take 2  mg by mouth at bedtime. 03/27/22   [provider]  tretinoin (RETIN-A) 0.025 % cream Apply pea sized amount to face for acne 12/31/21   Deirdre Evener, MD      Allergies    Patient has no known allergies.    Review of Systems   Review of Systems  Physical Exam Updated Vital Signs BP (!) 149/91 (BP Location: Right Arm)   Pulse 89   Temp 98.2 F (36.8 C)   Resp 16   LMP 03/04/2011   SpO2 100%  Physical Exam Constitutional:      General: She is not in acute distress. HENT:     Head: Normocephalic and atraumatic.  Eyes:     Conjunctiva/sclera:  Conjunctivae normal.     Pupils: Pupils are equal, round, and reactive to light.  Cardiovascular:     Rate and Rhythm: Normal rate and regular rhythm.  Pulmonary:     Effort: Pulmonary effort is normal. No respiratory distress.  Musculoskeletal:     Comments: Tenderness near the head of the humerus and the glenoid, patient is able to perform full active and passive range of motion testing at the left shoulder, no clavicular tenderness or deformity, no scapular pinpoint tenderness Patient does have paraspinal lumbar tenderness on exam, no significant pelvic bony tenderness, she is able to ambulate  Skin:    General: Skin is warm and dry.  Neurological:     General: No focal deficit present.     Mental Status: She is alert. Mental status is at baseline.  Psychiatric:        Mood and Affect: Mood normal.        Behavior: Behavior normal.     ED Results / Procedures / Treatments   Labs (all labs ordered are listed, but only abnormal results are displayed) Labs Reviewed - No data to display  EKG None  Radiology DG Shoulder Left Result Date: 05/18/2023 CLINICAL DATA:  Fall onto shoulder. EXAM: LEFT SHOULDER - 2+ VIEW COMPARISON:  None Available. FINDINGS: There is no evidence of fracture or dislocation. There is no evidence of arthropathy or other focal bone abnormality. Soft tissues are unremarkable. IMPRESSION: Negative. Electronically Signed   By: Signa Kell M.D.   On: 05/18/2023 11:08    Procedures Procedures    Medications Ordered in ED Medications  ibuprofen (ADVIL) tablet 800 mg (800 mg Oral Given 05/18/23 1015)    ED Course/ Medical Decision Making/ A&P                                 Medical Decision Making Amount and/or Complexity of Data Reviewed Radiology: ordered.  Risk Prescription drug management.   Patient is here with mechanical fall complaining primarily left shoulder pain but also having stiffness in the whole left side of her body.  I have  ordered an x-ray of the left shoulder given her pinpoint tenderness on exam.  She is neurovascular tact and able to perform full range of motion testing, and I have a low suspicion for rotator cuff tear.  I personally reviewed and interpreted the patient's x-ray with no emergent finding.  I have a low suspicion for pelvic fracture, significant rib fracture, or intra-abdominal injury.  The patient was given Motrin in the ED.   No evidence or report of head injury and she is not needing neuroimaging.  Doubt spinal fracture.  She is stable for discharge  Patient  was requesting a short-term refill of her chronic Robaxin when she is running low.  20 tablets provided.  She was also needing breakthrough pain medicine and I provided a very short course of oxycodone 5 mg tablets.        Final Clinical Impression(s) / ED Diagnoses Final diagnoses:  Fall, initial encounter  Pain in joint of left shoulder    Rx / DC Orders ED Discharge Orders          Ordered    methocarbamol (ROBAXIN) 500 MG tablet  2 times daily PRN        05/18/23 1117    oxyCODONE (ROXICODONE) 5 MG immediate release tablet  Every 8 hours PRN        05/18/23 1117              Terald Sleeper, MD 05/18/23 1118

## 2023-12-10 ENCOUNTER — Other Ambulatory Visit: Payer: Self-pay | Admitting: Nurse Practitioner

## 2023-12-10 DIAGNOSIS — Z1231 Encounter for screening mammogram for malignant neoplasm of breast: Secondary | ICD-10-CM

## 2024-01-08 ENCOUNTER — Ambulatory Visit
